# Patient Record
Sex: Male | Born: 1995 | Race: Black or African American | Hispanic: No | Marital: Single | Smoking: Never smoker
Health system: Southern US, Community
[De-identification: ages and names within clinical notes are randomized; demographics above are authoritative.]

## PROBLEM LIST (undated history)

## (undated) DIAGNOSIS — J9801 Acute bronchospasm: Secondary | ICD-10-CM

## (undated) DIAGNOSIS — J45909 Unspecified asthma, uncomplicated: Secondary | ICD-10-CM

## (undated) HISTORY — PX: NO PAST SURGERIES: SHX2092

---
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
## 2015-09-17 ENCOUNTER — Encounter (HOSPITAL_COMMUNITY): Payer: Self-pay | Admitting: *Deleted

## 2015-09-17 ENCOUNTER — Emergency Department (HOSPITAL_COMMUNITY): Payer: Medicaid Other

## 2015-09-17 ENCOUNTER — Emergency Department (HOSPITAL_COMMUNITY)
Admission: EM | Admit: 2015-09-17 | Discharge: 2015-09-17 | Disposition: A | Discharge status: Home / Self Care | Payer: Medicaid Other | Attending: Emergency Medicine | Admitting: Emergency Medicine

## 2015-09-17 DIAGNOSIS — J3489 Other specified disorders of nose and nasal sinuses: Secondary | ICD-10-CM | POA: Insufficient documentation

## 2015-09-17 DIAGNOSIS — J029 Acute pharyngitis, unspecified: Secondary | ICD-10-CM | POA: Insufficient documentation

## 2015-09-17 DIAGNOSIS — J45909 Unspecified asthma, uncomplicated: Secondary | ICD-10-CM | POA: Diagnosis present

## 2015-09-17 DIAGNOSIS — J069 Acute upper respiratory infection, unspecified: Secondary | ICD-10-CM | POA: Diagnosis not present

## 2015-09-17 DIAGNOSIS — J4521 Mild intermittent asthma with (acute) exacerbation: Secondary | ICD-10-CM | POA: Insufficient documentation

## 2015-09-17 DIAGNOSIS — Z202 Contact with and (suspected) exposure to infections with a predominantly sexual mode of transmission: Secondary | ICD-10-CM | POA: Insufficient documentation

## 2015-09-17 MED ORDER — ALBUTEROL SULFATE (2.5 MG/3ML) 0.083% IN NEBU
5.0000 mg | INHALATION_SOLUTION | Freq: Once | RESPIRATORY_TRACT | Status: AC
  Administered 2015-09-17: 5 mg via RESPIRATORY_TRACT

## 2015-09-17 MED ORDER — ALBUTEROL SULFATE (2.5 MG/3ML) 0.083% IN NEBU
INHALATION_SOLUTION | RESPIRATORY_TRACT | Status: AC
  Filled 2015-09-17: qty 6

## 2015-09-17 MED ORDER — AZITHROMYCIN 1 G PO PACK
1.0000 g | PACK | Freq: Once | ORAL | Status: AC
  Administered 2015-09-17: 1 g via ORAL
  Filled 2015-09-17: qty 1

## 2015-09-17 MED ORDER — LIDOCAINE HCL (PF) 1 % IJ SOLN
0.9000 mL | Freq: Once | INTRAMUSCULAR | Status: AC
  Administered 2015-09-17: 0.9 mL

## 2015-09-17 MED ORDER — LIDOCAINE HCL (PF) 1 % IJ SOLN
INTRAMUSCULAR | Status: AC
  Filled 2015-09-17: qty 5

## 2015-09-17 MED ORDER — CEFTRIAXONE SODIUM 250 MG IJ SOLR
250.0000 mg | Freq: Once | INTRAMUSCULAR | Status: AC
  Administered 2015-09-17: 250 mg via INTRAMUSCULAR
  Filled 2015-09-17: qty 250

## 2015-09-17 NOTE — ED Notes (Signed)
As patient was walking out of room to be discharged he requested a STD check.

## 2015-09-17 NOTE — ED Notes (Signed)
Pt states that 2 days ago he started experiencing asthma exacerbation. States that he also has congestion and chest tightness. States that he has tried his inhaler twice with no relief. No distress or shortness of breath noted in triage.

## 2015-09-17 NOTE — Discharge Instructions (Signed)
Asthma, Adult °Asthma is a condition of the lungs in which the airways tighten and narrow. Asthma can make it hard to breathe. Asthma cannot be cured, but medicine and lifestyle changes can help control it. Asthma may be started (triggered) by: °· Animal skin flakes (dander). °· Dust. °· Cockroaches. °· Pollen. °· Mold. °· Smoke. °· Cleaning products. °· Hair sprays or aerosol sprays. °· Paint fumes or strong smells. °· Cold air, weather changes, and winds. °· Crying or laughing hard. °· Stress. °· Certain medicines or drugs. °· Foods, such as dried fruit, potato chips, and sparkling grape juice. °· Infections or conditions (colds, flu). °· Exercise. °· Certain medical conditions or diseases. °· Exercise or tiring activities. °HOME CARE  °· Take medicine as told by your doctor. °· Use a peak flow meter as told by your doctor. A peak flow meter is a tool that measures how well the lungs are working. °· Record and keep track of the peak flow meter's readings. °· Understand and use the asthma action plan. An asthma action plan is a written plan for taking care of your asthma and treating your attacks. °· To help prevent asthma attacks: °· Do not smoke. Stay away from secondhand smoke. °· Change your heating and air conditioning filter often. °· Limit your use of fireplaces and wood stoves. °· Get rid of pests (such as roaches and mice) and their droppings. °· Throw away plants if you see mold on them. °· Clean your floors. Dust regularly. Use cleaning products that do not smell. °· Have someone vacuum when you are not home. Use a vacuum cleaner with a HEPA filter if possible. °· Replace carpet with wood, tile, or vinyl flooring. Carpet can trap animal skin flakes and dust. °· Use allergy-proof pillows, mattress covers, and box spring covers. °· Wash bed sheets and blankets every week in hot water and dry them in a dryer. °· Use blankets that are made of polyester or cotton. °· Clean bathrooms and kitchens with bleach.  If possible, have someone repaint the walls in these rooms with mold-resistant paint. Keep out of the rooms that are being cleaned and painted. °· Wash hands often. °GET HELP IF: °· You have make a whistling sound when breaking (wheeze), have shortness of breath, or have a cough even if taking medicine to prevent attacks. °· The colored mucus you cough up (sputum) is thicker than usual. °· The colored mucus you cough up changes from clear or white to yellow, green, gray, or bloody. °· You have problems from the medicine you are taking such as: °· A rash. °· Itching. °· Swelling. °· Trouble breathing. °· You need reliever medicines more than 2-3 times a week. °· Your peak flow measurement is still at 50-79% of your personal best after following the action plan for 1 hour. °· You have a fever. °GET HELP RIGHT AWAY IF:  °· You seem to be worse and are not responding to medicine during an asthma attack. °· You are short of breath even at rest. °· You get short of breath when doing very little activity. °· You have trouble eating, drinking, or talking. °· You have chest pain. °· You have a fast heartbeat. °· Your lips or fingernails start to turn blue. °· You are light-headed, dizzy, or faint. °· Your peak flow is less than 50% of your personal best. °  °This information is not intended to replace advice given to you by your health care provider. Make sure   you discuss any questions you have with your health care provider.   Document Released: 04/20/2008 Document Revised: 07/24/2015 Document Reviewed: 06/01/2013 Elsevier Interactive Patient Education 2016 Elsevier Inc. Upper Respiratory Infection, Adult Most upper respiratory infections (URIs) are a viral infection of the air passages leading to the lungs. A URI affects the nose, throat, and upper air passages. The most common type of URI is nasopharyngitis and is typically referred to as "the common cold." URIs run their course and usually go away on their own.  Most of the time, a URI does not require medical attention, but sometimes a bacterial infection in the upper airways can follow a viral infection. This is called a secondary infection. Sinus and middle ear infections are common types of secondary upper respiratory infections. Bacterial pneumonia can also complicate a URI. A URI can worsen asthma and chronic obstructive pulmonary disease (COPD). Sometimes, these complications can require emergency medical care and may be life threatening.  CAUSES Almost all URIs are caused by viruses. A virus is a type of germ and can spread from one person to another.  RISKS FACTORS You may be at risk for a URI if:   You smoke.   You have chronic heart or lung disease.  You have a weakened defense (immune) system.   You are very young or very old.   You have nasal allergies or asthma.  You work in crowded or poorly ventilated areas.  You work in health care facilities or schools. SIGNS AND SYMPTOMS  Symptoms typically develop 2-3 days after you come in contact with a cold virus. Most viral URIs last 7-10 days. However, viral URIs from the influenza virus (flu virus) can last 14-18 days and are typically more severe. Symptoms may include:   Runny or stuffy (congested) nose.   Sneezing.   Cough.   Sore throat.   Headache.   Fatigue.   Fever.   Loss of appetite.   Pain in your forehead, behind your eyes, and over your cheekbones (sinus pain).  Muscle aches.  DIAGNOSIS  Your health care provider may diagnose a URI by:  Physical exam.  Tests to check that your symptoms are not due to another condition such as:  Strep throat.  Sinusitis.  Pneumonia.  Asthma. TREATMENT  A URI goes away on its own with time. It cannot be cured with medicines, but medicines may be prescribed or recommended to relieve symptoms. Medicines may help:  Reduce your fever.  Reduce your cough.  Relieve nasal congestion. HOME CARE  INSTRUCTIONS   Take medicines only as directed by your health care provider.   Gargle warm saltwater or take cough drops to comfort your throat as directed by your health care provider.  Use a warm mist humidifier or inhale steam from a shower to increase air moisture. This may make it easier to breathe.  Drink enough fluid to keep your urine clear or pale yellow.   Eat soups and other clear broths and maintain good nutrition.   Rest as needed.   Return to work when your temperature has returned to normal or as your health care provider advises. You may need to stay home longer to avoid infecting others. You can also use a face mask and careful hand washing to prevent spread of the virus.  Increase the usage of your inhaler if you have asthma.   Do not use any tobacco products, including cigarettes, chewing tobacco, or electronic cigarettes. If you need help quitting, ask your health  care provider. PREVENTION  The best way to protect yourself from getting a cold is to practice good hygiene.   Avoid oral or hand contact with people with cold symptoms.   Wash your hands often if contact occurs.  There is no clear evidence that vitamin C, vitamin E, echinacea, or exercise reduces the chance of developing a cold. However, it is always recommended to get plenty of rest, exercise, and practice good nutrition.  SEEK MEDICAL CARE IF:   You are getting worse rather than better.   Your symptoms are not controlled by medicine.   You have chills.  You have worsening shortness of breath.  You have brown or red mucus.  You have yellow or brown nasal discharge.  You have pain in your face, especially when you bend forward.  You have a fever.  You have swollen neck glands.  You have pain while swallowing.  You have white areas in the back of your throat. SEEK IMMEDIATE MEDICAL CARE IF:   You have severe or persistent:  Headache.  Ear pain.  Sinus pain.  Chest  pain.  You have chronic lung disease and any of the following:  Wheezing.  Prolonged cough.  Coughing up blood.  A change in your usual mucus.  You have a stiff neck.  You have changes in your:  Vision.  Hearing.  Thinking.  Mood. MAKE SURE YOU:   Understand these instructions.  Will watch your condition.  Will get help right away if you are not doing well or get worse.   This information is not intended to replace advice given to you by your health care provider. Make sure you discuss any questions you have with your health care provider.   Document Released: 04/28/2001 Document Revised: 03/19/2015 Document Reviewed: 02/07/2014 Elsevier Interactive Patient Education 2016 [employer].   Emergency Department Resource Guide 1) Find a Doctor and Pay Out of Pocket Although you won't have to find out who is covered by your insurance plan, it is a good idea to ask around and get recommendations. You will then need to call the office and see if the doctor you have chosen will accept you as a new patient and what types of options they offer for patients who are self-pay. Some doctors offer discounts or will set up payment plans for their patients who do not have insurance, but you will need to ask so you aren't surprised when you get to your appointment.  2) Contact Your Local Health Department Not all health departments have doctors that can see patients for sick visits, but many do, so it is worth a call to see if yours does. If you don't know where your local health department is, you can check in your phone book. The CDC also has a tool to help you locate your state's health department, and many state websites also have listings of all of their local health departments.  3) Find a Walk-in Clinic If your illness is not likely to be very severe or complicated, you may want to try a walk in clinic. These are popping up all over the country in pharmacies, drugstores, and  shopping centers. They're usually staffed by nurse practitioners or physician assistants that have been trained to treat common illnesses and complaints. They're usually fairly quick and inexpensive. However, if you have serious medical issues or chronic medical problems, these are probably not your best option.  No Primary Care Doctor: - Call Health Connect at  [phone] -  they can help you locate a primary care doctor that  accepts your insurance, provides certain services, etc. - Physician Referral Service- [phone]  Chronic Pain Problems: Organization         Address  Phone   Notes  [facility]  [phone] Patients need to be referred by their primary care doctor.   Medication Assistance: Organization         Address  Phone   Notes  [facility] Medication [facility] [address] [place] [place], [place] [address] [phone] --Must be a resident of [facility] -- Must have NO insurance coverage whatsoever (no Medicaid/ Medicare, etc.) -- The pt. MUST have a primary care doctor that directs their care regularly and follows them in the community   [facility]  [phone]   [employer]  [phone]    Agencies that provide inexpensive medical care: Organization         Address  Phone   Notes  [facility]  [phone]   [facility]    [phone]   [facility] [address] [place], [place] [address] [phone]   [facility] [address] [place] [place] [phone]   Planned Parenthood    [phone]   [facility]    [phone]   [facility]  [address], [place] Phone:  [phone], Fax:  [phone] Hours of Operation:  9 am - 6 pm, M-F.  Also accepts Medicaid/Medicare and self-pay.  [facility]  [address], [place] Phone: [phone],  Fax: [phone]. Hours of Operation:  8:30 am - 5:30 pm, M-F.  Also accepts Medicaid and self-pay.  [facility] [place] [address] [place] Phone: [phone]   [facility] [address] Natasha Bence [place], [place] [phone], Ext. [phone] Mondays & Thursdays: 7-9 AM.  First 15 patients are seen on a first come, first serve basis.    Medicaid-accepting [facility] Providers:  Organization         Address  Phone   Notes  [facility] [address], [place] [phone] Also accepts self-pay patients.  [facility] [address] Laurell Josephs [place], [place]  [phone]   [facility] [address], [place] [phone]   [facility] [address], [place] [phone]   Renaye Rakers [address], [place]   [phone] Only accepts [place] patients after they have their name applied to their card.   Self-Pay (no insurance) in [facility]:  Organization         Address  Phone   Notes  Sickle Cell Patients, [facility] [address] [place], [place] [phone]   [facility] [address] [place], [place] [phone]   [facility] [place]  [address], [place] [phone]   [facility]/Dr. Osei-Bonsu  [address], [place] or [address], [place] [phone] Phone number for both [place] and [place] locations is the same.  [facility]gent [facility] and [facility] [address] [place] [phone]   [facility] [address], [place] or [address] [phone]   [facility] [address], [place] [phone], phone; [phone], fax Sees patients 1st and 3rd Saturday of every month.  Must not qualify for public or private  insurance (i.e. Medicaid, Medicare, [place] Health Choice, Veterans' Benefits)  Household income should be no more than 200% of the poverty level The clinic cannot treat you if you are pregnant or think you are pregnant  Sexually transmitted diseases are not treated at the clinic.    Dental Care: Organization         Address  Phone  Notes  [facility] [address], [place] [phone] Accepts children up to age 19 who are enrolled in [place] or [place] Health Choice; pregnant women with a Medicaid card; and children who have applied for Medicaid or [place] Health Choice, but were declined, whose parents can pay a reduced fee at time of service.  [facility] [place]  [address], [place] [phone] Accepts children up to age 19 who are enrolled in [place] or [place] Health Choice; pregnant women with a Medicaid card; and children who have applied for Medicaid or [place] Health Choice, but were declined, whose parents can pay a reduced fee at time of service.  [facility]  [address], [place] [phone] Patients are seen by appointment only. Walk-ins are not accepted. [facility] will see patients 19 years of age and older. Monday - Tuesday (8am-5pm) Most Wednesdays (8:30-5pm) $30 per visit, cash only  [facility]  [address], [facility] [place] [phone] Patients are seen by appointment only. Walk-ins are not accepted. [facility] will see patients 19 years of age and older. One Wednesday Evening (Monthly: Volunteer Based).  $30 per visit, cash only  [facility]  [phone] for adults; Children under age 474, call Graduate Pediatric Dentistry at [phone]. Children aged 704-14, please call [phone] to request a pediatric application.  Dental services are provided in all areas of dental care  including fillings, crowns and bridges, complete and partial dentures, implants, gum treatment, root canals, and extractions. Preventive care is also provided. Treatment is provided to both adults and children. Patients are selected via a lottery and there is often a waiting list.   [facility] [address], [place]  [phone] [email or web address]   [facility] [address], [place], [place] [phone], Ext. [phone] Second and Fourth Thursday of each month, opens at 6:30 AM; Clinic ends at 9 AM.  Patients are seen on a first-come first-served basis, and a limited number are seen during each clinic.   [facility]  [address], [place], [place] [phone]   Eligibility Requirements You must have lived in [place], [place], or [place] counties for at least the last three months.   You cannot be eligible for state or federal sponsored [place]healthcare insurance, including CIGNAVeterans Administration, [place]icaid, or [employer]icare.   You generally cannot be eligible for healthcare insurance through your employer.    How to apply: Eligibility screenings are held every Tuesday and Wednesday afternoon from 1:00 pm until 4:00 pm. You do not need an appointment for the interview!  [facility] [address], [place], [place] [phone]   [facility]  [phone]   [facility]  [phone]   [facility] Health Department  [phone]    Behavioral Health Resources in the Community: Intensive Outpatient Programs Organization         Address  Phone  Notes  [facility] [address], [place], [place] [phone]   [facility] Outpatient [address], [place], [place] [phone]   [facility] [address] [place], [place]  [phone]   [facility] Mental Health [address]  [place], [place] [phone] or [phone]    Substance Abuse Resources Organization         Address  Phone  Notes  Al[facility] Drug Services  [phone]   [facility]  [phone]   [facility] [place]  [phone]   Floydene Flock  [phone]   Residential & Outpatient Substance Abuse Program  [phone]   Psychological Services Organization         Address  Phone  Notes  [facility]  [phone]   [facility]  [phone]   [facility] Mental Health [address], [place] [phone] or [phone]    Mobile Crisis Teams Organization         Address  Phone  Notes  [facility], Mobile Crisis Care Unit  [phone]   [facility]  [address] [place], [place] [phone]   Doristine Locks [address] [place] [phone]    Self-Help/Support Groups Organization         Address  Phone             Notes  [facility] [place] - variety of support groups  [phone]- I7437963 Call for more information  Narcotics Anonymous (NA), [facility] [address], [employer] [place]  2 meetings at this location   Statistician         Address  Phone  Notes  [facility] [address],    [place]  [phone]   [facility] [place]  [address] [place] [address], [place], [place] [phone]   [facility] [address] [place], [place] [phone] Admissions: 8am-3pm M-F  [facility] [address]    [place], [place] [phone]   [facility] [address] [place], [place], [place] [phone]   [facility] [address]  [place], [place] [phone]   [facility] - Intensive Outpatient [address] Laure[address] Josephs [address], [place], [place] [phone]   [facility] ([facility] [address] [place]  [place], [place] [phone] or [phone]   [facility]  [facility] ([facility]) [address], [place], [place] [phone] Accepts Medicaid  [facility] [address]  [place] [phone] Substance Abuse/Addiction Treatment   [facility] Organization         Address  Phone  Notes  [facility]  [phone]   Angie Fava, PhD [address] Ervin Knack [place], [place]   [phone] or [phone]   [facility] Behavioral   [address] [place], [place] [phone]   [facility] [address], [place], [place] [phone] Insurance/Medicaid/sponsorship through [employer] and Families [address]  [place], [place] [phone] Therapy/tele-psych/case  [facility] [address]   [place], [place] [phone]    Dr. Lolly Mustache  [phone]   [facility]  [employer] [facility]. 1) [address], [place] 2) [address], [place] 3)  [address], [place] [phone]  [phone]   [facility] Child Abuse Hotline 9377274794 or [phone] (After Hours)     Sexually Transmitted Disease A sexually transmitted disease (STD) is a disease or infection that may be passed (transmitted) from person to person, usually during sexual activity. This may happen by way of saliva, semen, blood, vaginal mucus, or urine. Common STDs include:  Gonorrhea.  Chlamydia.  Syphilis.  HIV and AIDS.  Genital herpes.  Hepatitis B and C.  Trichomonas.  Human papillomavirus (HPV).  Pubic lice.  Scabies.  Mites.  Bacterial vaginosis. WHAT ARE CAUSES OF STDs? An STD may be caused by bacteria, a virus, or parasites. STDs are often transmitted during sexual activity if one person is infected. However, they may also be transmitted through nonsexual means. STDs may be transmitted after:   Sexual intercourse with an infected person.  Sharing sex toys with  an infected person.  Sharing needles with an infected person or using unclean piercing or tattoo needles.  Having intimate contact with the genitals, mouth, or rectal areas of an infected person.  Exposure to infected fluids during birth. WHAT ARE THE SIGNS AND SYMPTOMS OF STDs? Different STDs have different symptoms. Some people may not have any symptoms. If symptoms are present, they may include:  Painful or bloody urination.  Pain in the pelvis, abdomen, vagina, anus, throat, or eyes.  A skin rash, itching, or irritation.  Growths, ulcerations, blisters, or sores in the genital and anal areas.  Abnormal vaginal discharge with or without bad odor.  Penile discharge in men.  Fever.  Pain or bleeding during sexual intercourse.  Swollen glands in the groin area.  Yellow skin and eyes (jaundice). This is seen with hepatitis.  Swollen testicles.  Infertility.  Sores and blisters in the mouth. HOW ARE STDs DIAGNOSED? To make a diagnosis, your health care provider may:  Take a medical history.  Perform a physical exam.  Take a sample of any discharge to examine.  Swab the throat, cervix, opening to the penis, rectum, or vagina for testing.  Test a sample of your first morning urine.  Perform blood tests.  Perform a Pap test, if this applies.  Perform a colposcopy.  Perform a laparoscopy. HOW ARE STDs TREATED? Treatment depends on the STD. Some STDs may be treated but not cured.  Chlamydia, gonorrhea, trichomonas, and syphilis can be cured with antibiotic medicine.  Genital herpes, hepatitis, and HIV can be treated, but not cured, with prescribed medicines. The medicines lessen symptoms.  Genital warts from HPV can be treated with medicine or by freezing, burning (electrocautery), or surgery. Warts may come back.  HPV cannot be cured with medicine or surgery. However, abnormal areas may be removed from the cervix, vagina, or vulva.  If your diagnosis is  confirmed, your recent sexual partners need treatment. This is true even if they are symptom-free or have a negative culture or evaluation. They should not have sex until their health care providers say it is okay.  Your health care provider may test you for infection again 3 months after treatment. HOW CAN I REDUCE MY RISK OF GETTING AN  STD? Take these steps to reduce your risk of getting an STD:  Use latex condoms, dental dams, and water-soluble lubricants during sexual activity. Do not use petroleum jelly or oils.  Avoid having multiple sex partners.  Do not have sex with someone who has other sex partners  Do not have sex with anyone you do not know or who is at high risk for an STD.  Avoid risky sex practices that can break your skin.  Do not have sex if you have open sores on your mouth or skin.  Avoid drinking too much alcohol or taking illegal drugs. Alcohol and drugs can affect your judgment and put you in a vulnerable position.  Avoid engaging in oral and anal sex acts.  Get vaccinated for HPV and hepatitis. If you have not received these vaccines in the past, talk to your health care provider about whether one or both might be right for you.  If you are at risk of being infected with HIV, it is recommended that you take a prescription medicine daily to prevent HIV infection. This is called pre-exposure prophylaxis (PrEP). You are considered at risk if:  You are a man who has sex with other men (MSM).  You are a heterosexual man or woman and are sexually active with more than one partner.  You take drugs by injection.  You are sexually active with a partner who has HIV.  Talk with your health care provider about whether you are at high risk of being infected with HIV. If you choose to begin PrEP, you should first be tested for HIV. You should then be tested every 3 months for as long as you are taking PrEP. WHAT SHOULD I DO IF I THINK I HAVE AN STD?  See your health  care provider.  Tell your sexual partner(s). They should be tested and treated for any STDs.  Do not have sex until your health care provider says it is okay. WHEN SHOULD I GET IMMEDIATE MEDICAL CARE? Contact your health care provider right away if:   You have severe abdominal pain.  You are a man and notice swelling or pain in your testicles.  You are a woman and notice swelling or pain in your vagina.   This information is not intended to replace advice given to you by your health care provider. Make sure you discuss any questions you have with your health care provider.   Document Released: 01/23/2003 Document Revised: 11/23/2014 Document Reviewed: 05/23/2013 Elsevier Interactive Patient Education [employer]

## 2015-09-17 NOTE — ED Provider Notes (Signed)
CSN: [id number]     Arrival date & time 09/17/15  1459 History  By signing my name below, I, John Schwartz, attest that this documentation has been prepared under the direction and in the presence of John Morn, NP. Electronically Signed: Elon Schwartz ED Scribe. 09/17/2015. 4:44 PM.    Chief Complaint  Patient presents with  . Asthma   Patient is a 19 y.o. male presenting with asthma. The history is provided by the patient. No language interpreter was used.  Asthma This is a recurrent problem. The current episode started 2 days ago. The problem occurs constantly. The problem has not changed since onset.Associated symptoms comments: Chest tightness, rhinorrhea, sore throat.. Nothing aggravates the symptoms. Nothing relieves the symptoms. Treatments tried: Advair. The treatment provided no relief.   HPI Comments: John Schwartz is a 19 y.o. male with hx of asthma (well-controlled) who presents to the Emergency Department complaining of wheezing and chest tightness onset 2 days ago; unrelieved by advair (two doses since onset).  He describes his respiratory complaints as typical of his asthma exacerbation.  He reports his complaint was preceded by onset of rhinorrhea and sore throat.  He denies n/v.    Past Medical History  Diagnosis Date  . Asthma    History reviewed. No pertinent past surgical history. No family history on file. Social History  Substance Use Topics  . Smoking status: Never Smoker   . Smokeless tobacco: None  . Alcohol Use: No    Review of Systems  Respiratory: Positive for chest tightness and wheezing.   All other systems reviewed and are negative.   Allergies  Review of patient's allergies indicates no known allergies.  Home Medications   Prior to Admission medications   Not on File   BP 136/76 mmHg  Pulse 75  Temp(Src) 98.6 F (37 C) (Oral)  Resp 16  Ht  (1.778 m)  Wt 155 lb (70.308 kg)  BMI 22.24 kg/m2  SpO2 98% Physical Exam   Constitutional: He is oriented to person, place, and time. He appears well-developed and well-nourished. No distress.  HENT:  Head: Normocephalic and atraumatic.  Oropharynx erythematous with cobblestoning.  No exudate noted.    Eyes: Conjunctivae and EOM are normal.  Neck: Neck supple. No tracheal deviation present.  Cardiovascular: Normal rate.   Pulmonary/Chest: Effort normal. No respiratory distress.  Chest clear.    Musculoskeletal: Normal range of motion.  Lymphadenopathy:    He has no cervical adenopathy.  Neurological: He is alert and oriented to person, place, and time.  Skin: Skin is warm and dry.  Psychiatric: He has a normal mood and affect. His behavior is normal.  Nursing note and vitals reviewed.   ED Course  Procedures (including critical care time)  DIAGNOSTIC STUDIES: Oxygen Saturation is 98% on RA, normal by my interpretation.    COORDINATION OF CARE:  4:41 PM Will order CXR.  Patient acknowledges and agrees with plan.    Labs Review Labs Reviewed - No data to display  Imaging Review Dg Chest 2 View  09/17/2015  CLINICAL DATA:  Chest pain and congestion for 2 days. History of asthma. EXAM: CHEST  2 VIEW COMPARISON:  None. FINDINGS: The cardiac silhouette, mediastinal and hilar contours are normal. The lungs demonstrate mild hyperinflation. No infiltrates, edema or effusions. The bony thorax is intact. IMPRESSION: Mild hyperinflation but no acute pulmonary findings. Electronically Signed   By: Rudie Meyer M.D.   On: 09/17/2015 17:35   I have personally  reviewed and evaluated these images and lab results as part of my medical decision-making.   EKG Interpretation None     Upon discharge, patient reported to nurse that his girlfriend was seen and treated for gonorrhea.  Patient admits to unprotected sexual activity.  No current symptoms.  Will treat prophylactically with rocephin and zithromax. MDM   Final diagnoses:  None    Viral URI. Mild asthma  exacerbation. STD exposure. Return precautions discussed.  I personally performed the services described in this documentation, which was scribed in my presence. The recorded information has been reviewed and is accurate.   John Mornavid Keila Turan, NP 09/17/15 2254  John Raceravid Yelverton, MD 09/21/15 [phone]

## 2015-11-26 ENCOUNTER — Encounter (HOSPITAL_COMMUNITY): Payer: Self-pay | Admitting: Emergency Medicine

## 2016-01-14 ENCOUNTER — Encounter: Payer: Self-pay | Admitting: Emergency Medicine

## 2016-01-14 ENCOUNTER — Emergency Department
Admission: EM | Admit: 2016-01-14 | Discharge: 2016-01-14 | Disposition: A | Discharge status: Home / Self Care | Payer: No Typology Code available for payment source | Attending: Emergency Medicine | Admitting: Emergency Medicine

## 2016-01-14 DIAGNOSIS — Z79899 Other long term (current) drug therapy: Secondary | ICD-10-CM | POA: Diagnosis not present

## 2016-01-14 DIAGNOSIS — Y92410 Unspecified street and highway as the place of occurrence of the external cause: Secondary | ICD-10-CM | POA: Diagnosis not present

## 2016-01-14 DIAGNOSIS — Z792 Long term (current) use of antibiotics: Secondary | ICD-10-CM | POA: Insufficient documentation

## 2016-01-14 DIAGNOSIS — S8001XA Contusion of right knee, initial encounter: Secondary | ICD-10-CM | POA: Diagnosis not present

## 2016-01-14 DIAGNOSIS — S8991XA Unspecified injury of right lower leg, initial encounter: Secondary | ICD-10-CM | POA: Diagnosis present

## 2016-01-14 DIAGNOSIS — Y9389 Activity, other specified: Secondary | ICD-10-CM | POA: Diagnosis not present

## 2016-01-14 DIAGNOSIS — Z791 Long term (current) use of non-steroidal anti-inflammatories (NSAID): Secondary | ICD-10-CM | POA: Diagnosis not present

## 2016-01-14 DIAGNOSIS — Z7951 Long term (current) use of inhaled steroids: Secondary | ICD-10-CM | POA: Diagnosis not present

## 2016-01-14 DIAGNOSIS — Z7952 Long term (current) use of systemic steroids: Secondary | ICD-10-CM | POA: Diagnosis not present

## 2016-01-14 DIAGNOSIS — Y998 Other external cause status: Secondary | ICD-10-CM | POA: Diagnosis not present
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
## 2016-01-14 NOTE — Discharge Instructions (Signed)
Contusion °A contusion is a deep bruise. Contusions are the result of a blunt injury to tissues and muscle fibers under the skin. The injury causes bleeding under the skin. The skin overlying the contusion may turn blue, purple, or yellow. Minor injuries will give you a painless contusion, but more severe contusions may stay painful and swollen for a few weeks.  °CAUSES  °This condition is usually caused by a blow, trauma, or direct force to an area of the body. °SYMPTOMS  °Symptoms of this condition include: °· Swelling of the injured area. °· Pain and tenderness in the injured area. °· Discoloration. The area may have redness and then turn blue, purple, or yellow. °DIAGNOSIS  °This condition is diagnosed based on a physical exam and medical history. An X-ray, CT scan, or MRI may be needed to determine if there are any associated injuries, such as broken bones (fractures). °TREATMENT  °Specific treatment for this condition depends on what area of the body was injured. In general, the best treatment for a contusion is resting, icing, applying pressure to (compression), and elevating the injured area. This is often called the RICE strategy. Over-the-counter anti-inflammatory medicines may also be recommended for pain control.  °HOME CARE INSTRUCTIONS  °· Rest the injured area. °· If directed, apply ice to the injured area: °· Put ice in a plastic bag. °· Place a towel between your skin and the bag. °· Leave the ice on for 20 minutes, 2-3 times per day. °· If directed, apply light compression to the injured area using an elastic bandage. Make sure the bandage is not wrapped too tightly. Remove and reapply the bandage as directed by your health care provider. °· If possible, raise (elevate) the injured area above the level of your heart while you are sitting or lying down. °· Take over-the-counter and prescription medicines only as told by your health care provider. °SEEK MEDICAL CARE IF: °· Your symptoms do not  improve after several days of treatment. °· Your symptoms get worse. °· You have difficulty moving the injured area. °SEEK IMMEDIATE MEDICAL CARE IF:  °· You have severe pain. °· You have numbness in a hand or foot. °· Your hand or foot turns pale or cold. °  °This information is not intended to replace advice given to you by your health care provider. Make sure you discuss any questions you have with your health care provider. °  °Document Released: 08/12/2005 Document Revised: 07/24/2015 Document Reviewed: 03/20/2015 °Elsevier Interactive Patient Education ©2016 [employer] ° °Motor Vehicle Collision °It is common to have multiple bruises and sore muscles after a motor vehicle collision (MVC). These tend to feel worse for the first 24 hours. You may have the most stiffness and soreness over the first several hours. You may also feel worse when you wake up the first morning after your collision. After this point, you will usually begin to improve with each day. The speed of improvement often depends on the severity of the collision, the number of injuries, and the location and nature of these injuries. °HOME CARE INSTRUCTIONS °· Put ice on the injured area. °¨ Put ice in a plastic bag. °¨ Place a towel between your skin and the bag. °¨ Leave the ice on for 15-20 minutes, 3-4 times a day, or as directed by your health care provider. °· Drink enough fluids to keep your urine clear or pale yellow. Do not drink alcohol. °· Take a warm shower or bath once or twice a   day. This will increase blood flow to sore muscles. °· You may return to activities as directed by your caregiver. Be careful when lifting, as this may aggravate neck or back pain. °· Only take over-the-counter or prescription medicines for pain, discomfort, or fever as directed by your caregiver. Do not use aspirin. This may increase bruising and bleeding. °SEEK IMMEDIATE MEDICAL CARE IF: °· You have numbness, tingling, or weakness in the arms or  legs. °· You develop severe headaches not relieved with medicine. °· You have severe neck pain, especially tenderness in the middle of the back of your neck. °· You have changes in bowel or bladder control. °· There is increasing pain in any area of the body. °· You have shortness of breath, light-headedness, dizziness, or fainting. °· You have chest pain. °· You feel sick to your stomach (nauseous), throw up (vomit), or sweat. °· You have increasing abdominal discomfort. °· There is blood in your urine, stool, or vomit. °· You have pain in your shoulder (shoulder strap areas). °· You feel your symptoms are getting worse. °MAKE SURE YOU: °· Understand these instructions. °· Will watch your condition. °· Will get help right away if you are not doing well or get worse. °  °This information is not intended to replace advice given to you by your health care provider. Make sure you discuss any questions you have with your health care provider. °  °Document Released: 11/02/2005 Document Revised: 11/23/2014 Document Reviewed: 04/01/2011 °Elsevier Interactive Patient Education ©2016 [employer] ° °
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
## 2016-03-16 ENCOUNTER — Observation Stay
Admission: EM | Admit: 2016-03-16 | Discharge: 2016-03-17 | Disposition: A | Discharge status: Home / Self Care | Payer: Medicaid Other | Attending: Internal Medicine | Admitting: Internal Medicine

## 2016-03-16 ENCOUNTER — Emergency Department: Payer: Medicaid Other

## 2016-03-16 DIAGNOSIS — Z8261 Family history of arthritis: Secondary | ICD-10-CM | POA: Insufficient documentation

## 2016-03-16 DIAGNOSIS — J4541 Moderate persistent asthma with (acute) exacerbation: Secondary | ICD-10-CM | POA: Diagnosis present

## 2016-03-16 DIAGNOSIS — Z7951 Long term (current) use of inhaled steroids: Secondary | ICD-10-CM | POA: Diagnosis not present

## 2016-03-16 DIAGNOSIS — Z79899 Other long term (current) drug therapy: Secondary | ICD-10-CM | POA: Insufficient documentation

## 2016-03-16 DIAGNOSIS — J45901 Unspecified asthma with (acute) exacerbation: Principal | ICD-10-CM | POA: Insufficient documentation

## 2016-03-16 DIAGNOSIS — J4551 Severe persistent asthma with (acute) exacerbation: Secondary | ICD-10-CM | POA: Diagnosis present

## 2016-03-16 DIAGNOSIS — R0600 Dyspnea, unspecified: Secondary | ICD-10-CM | POA: Diagnosis present

## 2016-03-16 LAB — CBC WITH DIFFERENTIAL/PLATELET
BASOS ABS: 0.1 10*3/uL (ref 0–0.1)
EOS ABS: 0.8 10*3/uL — AB (ref 0–0.7)
HEMATOCRIT: 41.7 % (ref 40.0–52.0)
Hemoglobin: 14.1 g/dL (ref 13.0–18.0)
Lymphs Abs: 2.3 10*3/uL (ref 1.0–3.6)
MCH: 27.2 pg (ref 26.0–34.0)
MCHC: 33.8 g/dL (ref 32.0–36.0)
MCV: 80.4 fL (ref 80.0–100.0)
Monocytes Absolute: 0.6 10*3/uL (ref 0.2–1.0)
Monocytes Relative: 6 %
Neutro Abs: 5.4 10*3/uL (ref 1.4–6.5)
Neutrophils Relative %: 58 %
PLATELETS: 254 10*3/uL (ref 150–440)
RBC: 5.19 MIL/uL (ref 4.40–5.90)
RDW: 13.3 % (ref 11.5–14.5)
WBC: 9.2 10*3/uL (ref 3.8–10.6)

## 2016-03-16 LAB — CBC
HCT: 42.8 % (ref 40.0–52.0)
Hemoglobin: 14.5 g/dL (ref 13.0–18.0)
MCH: 27.3 pg (ref 26.0–34.0)
MCHC: 33.8 g/dL (ref 32.0–36.0)
MCV: 80.7 fL (ref 80.0–100.0)
PLATELETS: 243 10*3/uL (ref 150–440)
RBC: 5.3 MIL/uL (ref 4.40–5.90)
RDW: 13.4 % (ref 11.5–14.5)
WBC: 10.3 10*3/uL (ref 3.8–10.6)

## 2016-03-16 LAB — BASIC METABOLIC PANEL
Anion gap: 6 (ref 5–15)
Anion gap: 8 (ref 5–15)
BUN: 12 mg/dL (ref 6–20)
BUN: 12 mg/dL (ref 6–20)
CALCIUM: 9.5 mg/dL (ref 8.9–10.3)
CALCIUM: 9.4 mg/dL (ref 8.9–10.3)
CO2: 26 mmol/L (ref 22–32)
CO2: 24 mmol/L (ref 22–32)
CREATININE: 1.04 mg/dL (ref 0.61–1.24)
CREATININE: 1.08 mg/dL (ref 0.61–1.24)
Chloride: 106 mmol/L (ref 101–111)
Chloride: 106 mmol/L (ref 101–111)
GFR calc Af Amer: >60 mL/min (ref >60)
GFR calc non Af Amer: >60 mL/min (ref >60)
GFR calc non Af Amer: >60 mL/min (ref >60)
GLUCOSE: 108 mg/dL — AB (ref 65–99)
Glucose, Bld: 119 mg/dL — ABNORMAL HIGH (ref 65–99)
Potassium: 3.9 mmol/L (ref 3.5–5.1)
Potassium: 4.2 mmol/L (ref 3.5–5.1)
Sodium: 138 mmol/L (ref 135–145)
Sodium: 138 mmol/L (ref 135–145)

## 2016-03-16 MED ORDER — METHYLPREDNISOLONE SODIUM SUCC 40 MG IJ SOLR
40.0000 mg | Freq: Two times a day (BID) | INTRAMUSCULAR | Status: DC
  Administered 2016-03-16 – 2016-03-17 (×2): 40 mg via INTRAVENOUS
  Filled 2016-03-16 (×2): qty 1

## 2016-03-16 MED ORDER — IPRATROPIUM-ALBUTEROL 0.5-2.5 (3) MG/3ML IN SOLN
3.0000 mL | Freq: Once | RESPIRATORY_TRACT | Status: AC
  Administered 2016-03-16: 3 mL via RESPIRATORY_TRACT

## 2016-03-16 MED ORDER — ACETAMINOPHEN 325 MG PO TABS: 650.0000 mg | ORAL_TABLET | Freq: Four times a day (QID) | ORAL | Status: DC | PRN

## 2016-03-16 MED ORDER — METHYLPREDNISOLONE SODIUM SUCC 125 MG IJ SOLR
125.0000 mg | Freq: Four times a day (QID) | INTRAMUSCULAR | Status: DC
  Administered 2016-03-16 (×2): 125 mg via INTRAVENOUS
  Filled 2016-03-16 (×2): qty 2

## 2016-03-16 MED ORDER — SODIUM CHLORIDE 0.9 % IV SOLN: 250.0000 mL | INTRAVENOUS | Status: DC | PRN

## 2016-03-16 MED ORDER — HYDROCODONE-ACETAMINOPHEN 5-325 MG PO TABS: 1.0000 | ORAL_TABLET | ORAL | Status: DC | PRN

## 2016-03-16 MED ORDER — IPRATROPIUM-ALBUTEROL 0.5-2.5 (3) MG/3ML IN SOLN
RESPIRATORY_TRACT | Status: AC
  Administered 2016-03-16: 3 mL via RESPIRATORY_TRACT
  Filled 2016-03-16: qty 3

## 2016-03-16 MED ORDER — ONDANSETRON HCL 4 MG PO TABS: 4.0000 mg | ORAL_TABLET | Freq: Four times a day (QID) | ORAL | Status: DC | PRN

## 2016-03-16 MED ORDER — ALBUTEROL SULFATE (2.5 MG/3ML) 0.083% IN NEBU: 2.5000 mg | INHALATION_SOLUTION | RESPIRATORY_TRACT | Status: DC | PRN

## 2016-03-16 MED ORDER — IPRATROPIUM-ALBUTEROL 0.5-2.5 (3) MG/3ML IN SOLN
3.0000 mL | Freq: Four times a day (QID) | RESPIRATORY_TRACT | Status: DC
  Administered 2016-03-16 – 2016-03-17 (×5): 3 mL via RESPIRATORY_TRACT
  Filled 2016-03-16 (×5): qty 3

## 2016-03-16 MED ORDER — METHYLPREDNISOLONE SODIUM SUCC 125 MG IJ SOLR
125.0000 mg | Freq: Once | INTRAMUSCULAR | Status: AC
  Administered 2016-03-16: 125 mg via INTRAVENOUS
  Filled 2016-03-16: qty 2

## 2016-03-16 MED ORDER — IPRATROPIUM-ALBUTEROL 0.5-2.5 (3) MG/3ML IN SOLN
RESPIRATORY_TRACT | Status: AC
  Administered 2016-03-16: 3 mL via RESPIRATORY_TRACT
  Filled 2016-03-16: qty 6

## 2016-03-16 MED ORDER — GUAIFENESIN ER 600 MG PO TB12: 600.0000 mg | ORAL_TABLET | Freq: Two times a day (BID) | ORAL | Status: DC | PRN

## 2016-03-16 MED ORDER — ACETAMINOPHEN 650 MG RE SUPP: 650.0000 mg | Freq: Four times a day (QID) | RECTAL | Status: DC | PRN

## 2016-03-16 MED ORDER — SODIUM CHLORIDE 0.9% FLUSH: 3.0000 mL | INTRAVENOUS | Status: DC | PRN

## 2016-03-16 MED ORDER — SODIUM CHLORIDE 0.9% FLUSH
3.0000 mL | Freq: Two times a day (BID) | INTRAVENOUS | Status: DC
  Administered 2016-03-16 – 2016-03-17 (×3): 3 mL via INTRAVENOUS

## 2016-03-16 MED ORDER — ONDANSETRON HCL 4 MG/2ML IJ SOLN: 4.0000 mg | Freq: Four times a day (QID) | INTRAMUSCULAR | Status: DC | PRN

## 2016-03-16 NOTE — ED Notes (Signed)
Pt states that he started with wheezing yesterday has been using his inhalers and nebs without relief. Pt has audible wheezing, inspiratory and expiratory

## 2016-03-16 NOTE — ED Provider Notes (Signed)
[facility] Emergency Department Provider Note   ____________________________________________  Time seen: Approximately 1:52 AM  I have reviewed the triage vital signs and the nursing notes.   HISTORY  Chief Complaint Wheezing    HPI John Schwartz is a 20 y.o. male who presents to the ED from home with a chief complaint of wheezing. Patient has a history of asthma requiring intubation; last intubated and hospitalized approximately one year ago. Reports wheezing began yesterday secondary to environmental allergies. Has been using his inhalers and albuterol nebulizers without relief of symptoms. Denies associated fever, chills, chest pain, abdominal pain, nausea, vomiting, diarrhea. Denies recent travel or trauma. Nothing makes his symptoms better or worse.   Past Medical History  Diagnosis Date  . Bronchospasm   . Asthma     Patient Active Problem List   Diagnosis Date Noted  . Status asthmaticus 02/11/2014  . Acute respiratory failure (HCC) 02/11/2014    No past surgical history on file.  Current Outpatient Rx  Name  Route  Sig  Dispense  Refill  . albuterol (PROVENTIL HFA;VENTOLIN HFA) 108 (90 BASE) MCG/ACT inhaler   Inhalation   Inhale 2-3 puffs into the lungs every 6 (six) hours as needed for wheezing or shortness of breath.         Marland Kitchen albuterol (PROVENTIL HFA;VENTOLIN HFA) 108 (90 BASE) MCG/ACT inhaler   Inhalation   Inhale 1-2 puffs into the lungs every 6 (six) hours as needed for wheezing or shortness of breath.         Marland Kitchen albuterol (PROVENTIL) (2.5 MG/3ML) 0.083% nebulizer solution   Nebulization   Take 2.5 mg by nebulization every 6 (six) hours as needed for wheezing or shortness of breath.         . beclomethasone (QVAR) 40 MCG/ACT inhaler   Inhalation   Inhale 2 puffs into the lungs every 4 (four) hours.         . ciprofloxacin (CIPRO) 500 MG tablet   Oral   Take 1 tablet (500 mg total) by mouth 2 (two) times daily.   14 tablet   0   . Fluticasone-Salmeterol (ADVAIR DISKUS IN)   Inhalation   Inhale 1 puff into the lungs 2 (two) times daily.         Marland Kitchen guaiFENesin (MUCINEX) 600 MG 12 hr tablet   Oral   Take 600 mg by mouth 2 (two) times daily as needed for cough or to loosen phlegm (congestion).         Marland Kitchen HYDROcodone-acetaminophen (NORCO/VICODIN) 5-325 MG per tablet   Oral   Take 1 tablet by mouth every 4 (four) hours as needed for moderate pain or severe pain.   10 tablet   0   . ibuprofen (ADVIL,MOTRIN) 800 MG tablet   Oral   Take 1 tablet (800 mg total) by mouth 3 (three) times daily.   21 tablet   0   . metroNIDAZOLE (FLAGYL) 500 MG tablet   Oral   Take 1 tablet (500 mg total) by mouth 3 (three) times daily.   21 tablet   0   . predniSONE (DELTASONE) 20 MG tablet   Oral   Take 2 tablets (40 mg total) by mouth daily. Take 40 mg by mouth daily for 3 days, then 20mg  by mouth daily for 3 days, then 10mg  daily for 3 days   12 tablet   0   . predniSONE (DELTASONE) 50 MG tablet      1 tablet  PO daily x 6 days  Start 09/15/14   6 tablet   0     Allergies Review of patient's allergies indicates no known allergies.  Family History  Problem Relation Age of Onset  . Arthritis Paternal Grandmother     Social History Social History  Substance Use Topics  . Smoking status: Never Smoker   . Smokeless tobacco: Not on file  . Alcohol Use: No    Review of Systems  Constitutional: No fever/chills. Eyes: No visual changes. ENT: No sore throat. Cardiovascular: Denies chest pain. Respiratory: Positive for wheezing and shortness of breath. Gastrointestinal: No abdominal pain.  No nausea, no vomiting.  No diarrhea.  No constipation. Genitourinary: Negative for dysuria. Musculoskeletal: Negative for back pain. Skin: Negative for rash. Neurological: Negative for headaches, focal weakness or numbness.  10-point ROS otherwise  negative.  ____________________________________________   PHYSICAL EXAM:  VITAL SIGNS: ED Triage Vitals  Enc Vitals Group     BP 03/16/16 0112 112/99 mmHg     Pulse Rate 03/16/16 0112 74     Resp 03/16/16 0112 22     Temp 03/16/16 0112 97.8 F (36.6 C)     Temp Source 03/16/16 0112 Oral     SpO2 03/16/16 0112 99 %     Weight 03/16/16 0112 155 lb (70.308 kg)     Height 03/16/16 0112  (1.753 m)     Head Cir --      Peak Flow --      Pain Score --      Pain Loc --      Pain Edu? --      Excl. in GC? --     Constitutional: Alert and oriented. Well appearing and in mild acute distress. Eyes: Conjunctivae are normal. PERRL. EOMI. Head: Atraumatic. Nose: No congestion/rhinnorhea. Mouth/Throat: Mucous membranes are moist.  Oropharynx non-erythematous. Neck: No stridor.   Cardiovascular: Normal rate, regular rhythm. Grossly normal heart sounds.  Good peripheral circulation. Respiratory: Increased respiratory effort.  No retractions. Lungs with diffuse wheezing. Gastrointestinal: Soft and nontender. No distention. No abdominal bruits. No CVA tenderness. Musculoskeletal: No lower extremity tenderness nor edema.  No joint effusions. Neurologic:  Normal speech and language. No gross focal neurologic deficits are appreciated. No gait instability. Skin:  Skin is warm, dry and intact. No rash noted. Psychiatric: Mood and affect are normal. Speech and behavior are normal.  ____________________________________________   LABS (all labs ordered are listed, but only abnormal results are displayed)  Labs Reviewed  BASIC METABOLIC PANEL - Abnormal; Notable for the following:    Glucose, Bld 119 (*)    All other components within normal limits  CBC WITH DIFFERENTIAL/PLATELET   ____________________________________________  EKG  None ____________________________________________  RADIOLOGY  Portable chest x-ray (viewed by me, interpreted per Dr. Andria Meuse): No active  disease. ____________________________________________   PROCEDURES  Procedure(s) performed: None  Critical Care performed: No  ____________________________________________   INITIAL IMPRESSION / ASSESSMENT AND PLAN / ED COURSE  Pertinent labs & imaging results that were available during my care of the patient were reviewed by me and considered in my medical decision making (see chart for details).  20 year old male with a history of asthma requiring intubation who presents with asthma exacerbation. He is examined approximately one hour after IV Solu-Medrol administration and after 3 DuoNeb treatments. He has good aeration with room air saturations 100%; however, he remains tachypneic with diffuse wheezing. Will discuss with hospitalist evaluate patient in the emergency department for admission. ____________________________________________  FINAL CLINICAL IMPRESSION(S) / ED DIAGNOSES  Final diagnoses:  Asthma exacerbation attacks, moderate persistent      NEW MEDICATIONS STARTED DURING THIS VISIT:  New Prescriptions   No medications on file     Note:  This document was prepared using Dragon voice recognition software and may include unintentional dictation errors.    Irean HongJade J Sung, MD 03/16/16 [phone]

## 2016-03-16 NOTE — ED Notes (Signed)
MD at bedside. 

## 2016-03-16 NOTE — H&P (Addendum)
[facility] [place] at [facility]   PATIENT NAME: John Schwartz    MR#:  [id number]  DATE OF BIRTH:  Jun 03, 1996  DATE OF ADMISSION:  03/16/2016  PRIMARY CARE PHYSICIAN: No PCP Per Patient   REQUESTING/REFERRING PHYSICIAN:   CHIEF COMPLAINT:   Chief Complaint  Patient presents with  . Wheezing    HISTORY OF PRESENT ILLNESS: John Schwartz  is a 20 y.o. male with a known history of bronchial asthma presented to the emergency room with difficulty breathing for the last 1 day. Patient has wheeze for the last 1 day which has been increasing. Patient tried some inhalation treatment at home but did not help. He came to the emergency room he was given IV Solu-Medrol and DuoNeb nebulizations multiple times. He still continued to have wheezing and shortness of breath. Hospitalist service was consulted for further care. No history of chest pain. No history of fever or chills or cough. No history of recent travel or sick contacts at home. No history of headache dizziness blurry vision.  PAST MEDICAL HISTORY:   Past Medical History  Diagnosis Date  . Bronchospasm   . Asthma     PAST SURGICAL HISTORY: No past surgical history on file.  No surgeries in the past.  SOCIAL HISTORY:  Social History  Substance Use Topics  . Smoking status: Never Smoker   . Smokeless tobacco: Not on file  . Alcohol Use: No    FAMILY HISTORY:  Family History  Problem Relation Age of Onset  . Arthritis Paternal Grandmother    Personal History : Unemployed  DRUG ALLERGIES: No Known Allergies  REVIEW OF SYSTEMS:   CONSTITUTIONAL: No fever, fatigue or weakness.  EYES: No blurred or double vision.  EARS, NOSE, AND THROAT: No tinnitus or ear pain.  RESPIRATORY: No cough, has shortness of breath, has wheezing, no hemoptysis.  CARDIOVASCULAR: No chest pain, orthopnea, edema.  GASTROINTESTINAL: No nausea, vomiting, diarrhea or abdominal pain.  GENITOURINARY: No dysuria, hematuria.   ENDOCRINE: No polyuria, nocturia,  HEMATOLOGY: No anemia, easy bruising or bleeding SKIN: No rash or lesion. MUSCULOSKELETAL: No joint pain or arthritis.   NEUROLOGIC: No tingling, numbness, weakness.  PSYCHIATRY: No anxiety or depression.   MEDICATIONS AT HOME:  Prior to Admission medications   Medication Sig Start Date End Date Taking? Authorizing Provider  albuterol (PROVENTIL HFA;VENTOLIN HFA) 108 (90 BASE) MCG/ACT inhaler Inhale 2-3 puffs into the lungs every 6 (six) hours as needed for wheezing or shortness of breath.    Historical Provider, MD  albuterol (PROVENTIL HFA;VENTOLIN HFA) 108 (90 BASE) MCG/ACT inhaler Inhale 1-2 puffs into the lungs every 6 (six) hours as needed for wheezing or shortness of breath.    Historical Provider, MD  albuterol (PROVENTIL) (2.5 MG/3ML) 0.083% nebulizer solution Take 2.5 mg by nebulization every 6 (six) hours as needed for wheezing or shortness of breath.    Historical Provider, MD  beclomethasone (QVAR) 40 MCG/ACT inhaler Inhale 2 puffs into the lungs every 4 (four) hours.    Historical Provider, MD  ciprofloxacin (CIPRO) 500 MG tablet Take 1 tablet (500 mg total) by mouth 2 (two) times daily. 09/05/14   Mellody Drown, PA-C  Fluticasone-Salmeterol (ADVAIR DISKUS IN) Inhale 1 puff into the lungs 2 (two) times daily.    Historical Provider, MD  guaiFENesin (MUCINEX) 600 MG 12 hr tablet Take 600 mg by mouth 2 (two) times daily as needed for cough or to loosen phlegm (congestion).    Historical Provider, MD  HYDROcodone-acetaminophen (NORCO/VICODIN) 5-325 MG per tablet Take 1 tablet by mouth every 4 (four) hours as needed for moderate pain or severe pain. 09/05/14   Mellody DrownLauren Parker, PA-C  ibuprofen (ADVIL,MOTRIN) 800 MG tablet Take 1 tablet (800 mg total) by mouth 3 (three) times daily. 09/05/14   Mellody DrownLauren Parker, PA-C  metroNIDAZOLE (FLAGYL) 500 MG tablet Take 1 tablet (500 mg total) by mouth 3 (three) times daily. 09/05/14   Mellody DrownLauren Parker, PA-C  predniSONE  (DELTASONE) 20 MG tablet Take 2 tablets (40 mg total) by mouth daily. Take 40 mg by mouth daily for 3 days, then 20mg  by mouth daily for 3 days, then 10mg  daily for 3 days 08/25/14   Garlon HatchetLisa M Sanders, PA-C  predniSONE (DELTASONE) 50 MG tablet 1 tablet PO daily x 6 days  Start 09/15/14 09/14/14   Trixie DredgeEmily West, PA-C      PHYSICAL EXAMINATION:   VITAL SIGNS: Blood pressure 112/99, pulse 74, temperature 97.8 F (36.6 C), temperature source Oral, resp. rate 22, height 5\' 9"  (1.753 m), weight 70.308 kg (155 lb), SpO2 99 %.  GENERAL:  20 y.o.-year-old patient lying in the bed with no acute distress.  EYES: Pupils equal, round, reactive to light and accommodation. No scleral icterus. Extraocular muscles intact.  HEENT: Head atraumatic, normocephalic. Oropharynx and nasopharynx clear.  NECK:  Supple, no jugular venous distention. No thyroid enlargement, no tenderness.  LUNGS: Decreased breath sounds bilaterally,  Wheezing noted in both lung fields, no rales,rhonchi or crepitation. No use of accessory muscles of respiration.  CARDIOVASCULAR: S1, S2 normal. No murmurs, rubs, or gallops.  ABDOMEN: Soft, nontender, nondistended. Bowel sounds present. No organomegaly or mass.  EXTREMITIES: No pedal edema, cyanosis, or clubbing.  NEUROLOGIC: Cranial nerves II through XII are intact. Muscle strength 5/5 in all extremities. Sensation intact. Gait normal. PSYCHIATRIC: The patient is alert and oriented x 3.  SKIN: No obvious rash, lesion, or ulcer.   LABORATORY PANEL:   CBC No results for input(s): WBC, HGB, HCT, PLT, MCV, MCH, MCHC, RDW, LYMPHSABS, MONOABS, EOSABS, BASOSABS, BANDABS in the last 168 hours.  Invalid input(s): NEUTRABS, BANDSABD ------------------------------------------------------------------------------------------------------------------  Chemistries   Recent Labs Lab 03/16/16 0142  NA 138  K 3.9  CL 106  CO2 26  GLUCOSE 119*  BUN 12  CREATININE 1.04  CALCIUM 9.5    ------------------------------------------------------------------------------------------------------------------ estimated creatinine clearance is 112.7 mL/min (by C-G formula based on Cr of 1.04). ------------------------------------------------------------------------------------------------------------------ No results for input(s): TSH, T4TOTAL, T3FREE, THYROIDAB in the last 72 hours.  Invalid input(s): FREET3   Coagulation profile No results for input(s): INR, PROTIME in the last 168 hours. ------------------------------------------------------------------------------------------------------------------- No results for input(s): DDIMER in the last 72 hours. -------------------------------------------------------------------------------------------------------------------  Cardiac Enzymes No results for input(s): CKMB, TROPONINI, MYOGLOBIN in the last 168 hours.  Invalid input(s): CK ------------------------------------------------------------------------------------------------------------------ Invalid input(s): POCBNP  ---------------------------------------------------------------------------------------------------------------  Urinalysis No results found for: COLORURINE, APPEARANCEUR, LABSPEC, PHURINE, GLUCOSEU, HGBUR, BILIRUBINUR, KETONESUR, PROTEINUR, UROBILINOGEN, NITRITE, LEUKOCYTESUR   RADIOLOGY: Dg Chest Port 1 View  03/16/2016  CLINICAL DATA:  Wheezing for 2 days. EXAM: PORTABLE CHEST 1 VIEW COMPARISON:  09/17/2015 FINDINGS: Mild hyperinflation. Normal heart size and pulmonary vascularity. No focal airspace disease or consolidation in the lungs. No blunting of costophrenic angles. No pneumothorax. Mediastinal contours appear intact. IMPRESSION: No active disease. Electronically Signed   By: Burman NievesWilliam  Stevens M.D.   On: 03/16/2016 02:09    EKG: Orders placed or performed during the hospital encounter of 09/17/15  . EKG 12-Lead  . EKG 12-Lead  . EKG  IMPRESSION AND PLAN: 20 year old male patient with history of bronchial last presented to the emergency room with shortness of breath and wheezing. Admitting diagnosis 1. Acute bronchial asthma exacerbation 2. Dyspnea secondary to asthma exacerbation 3. Wheezing Treatment plan Admit patient to medical floor observation bed IV Solu-Medrol 125 mg every 6 hourly Duoneb treatments around-the-clock Oxygen as needed Sequential compression device to legs for DVT prophylaxis.  All the records are reviewed and case discussed with ED provider. Management plans discussed with the patient, family and they are in agreement.  CODE STATUS: Code Status History    Date Active Date Inactive Code Status Order ID Comments User Context   02/11/2014  7:07 PM 02/14/2014  9:10 PM Full Code [id number]  Nash Shearer, MD ED       TOTAL TIME TAKING CARE OF THIS PATIENT: 48 minutes.    [name] M.D on 03/16/2016 at 2:49 AM  Between 7am to 6pm - Pager - [phone]  After 6pm go to [email or web address] - password [email or web address] [facility]  [place]  Hospitalists  Office  [phone]  CC: Primary care physician; No PCP Per Patient

## 2016-03-16 NOTE — Progress Notes (Signed)
[facility] - [place] at [facility]   PATIENT NAME: John Schwartz    MR#:  [id number]  DATE OF BIRTH:  Aug 26, 1996  SUBJECTIVE:  CHIEF COMPLAINT:   Chief Complaint  Patient presents with  . Wheezing   Still shortness breath and wheezing. REVIEW OF SYSTEMS:  CONSTITUTIONAL: No fever, fatigue or weakness.  EYES: No blurred or double vision.  EARS, NOSE, AND THROAT: No tinnitus or ear pain.  RESPIRATORY: has cough, shortness of breath, wheezing but no hemoptysis.  CARDIOVASCULAR: No chest pain, orthopnea, edema.  GASTROINTESTINAL: No nausea, vomiting, diarrhea or abdominal pain.  GENITOURINARY: No dysuria, hematuria.  ENDOCRINE: No polyuria, nocturia,  HEMATOLOGY: No anemia, easy bruising or bleeding SKIN: No rash or lesion. MUSCULOSKELETAL: No joint pain or arthritis.   NEUROLOGIC: No tingling, numbness, weakness.  PSYCHIATRY: No anxiety or depression.   DRUG ALLERGIES:  No Known Allergies  VITALS:  Blood pressure 126/61, pulse 78, temperature 98.4 F (36.9 C), temperature source Oral, resp. rate 16, height  (1.753 m), weight 71.351 kg (157 lb 4.8 oz), SpO2 98 %.  PHYSICAL EXAMINATION:  GENERAL:  20 y.o.-year-old patient lying in the bed with no acute distress.  EYES: Pupils equal, round, reactive to light and accommodation. No scleral icterus. Extraocular muscles intact.  HEENT: Head atraumatic, normocephalic. Oropharynx and nasopharynx clear.  NECK:  Supple, no jugular venous distention. No thyroid enlargement, no tenderness.  LUNGS: Normal breath sounds bilaterally,  Expiratory wheezing, no rales,rhonchi or crepitation. No use of accessory muscles of respiration.  CARDIOVASCULAR: S1, S2 normal. No murmurs, rubs, or gallops.  ABDOMEN: Soft, nontender, nondistended. Bowel sounds present. No organomegaly or mass.  EXTREMITIES: No pedal edema, cyanosis, or clubbing.  NEUROLOGIC: Cranial nerves II through XII are intact. Muscle strength 5/5 in all  extremities. Sensation intact. Gait not checked.  PSYCHIATRIC: The patient is alert and oriented x 3.  SKIN: No obvious rash, lesion, or ulcer.    LABORATORY PANEL:   CBC  Recent Labs Lab 03/16/16 0409  WBC 10.3  HGB 14.5  HCT 42.8  PLT 243   ------------------------------------------------------------------------------------------------------------------  Chemistries   Recent Labs Lab 03/16/16 0409  NA 138  K 4.2  CL 106  CO2 24  GLUCOSE 108*  BUN 12  CREATININE 1.08  CALCIUM 9.4   ------------------------------------------------------------------------------------------------------------------  Cardiac Enzymes No results for input(s): TROPONINI in the last 168 hours. ------------------------------------------------------------------------------------------------------------------  RADIOLOGY:  Dg Chest Port 1 View  03/16/2016  CLINICAL DATA:  Wheezing for 2 days. EXAM: PORTABLE CHEST 1 VIEW COMPARISON:  09/17/2015 FINDINGS: Mild hyperinflation. Normal heart size and pulmonary vascularity. No focal airspace disease or consolidation in the lungs. No blunting of costophrenic angles. No pneumothorax. Mediastinal contours appear intact. IMPRESSION: No active disease. Electronically Signed   By: Burman Nieves M.D.   On: 03/16/2016 02:09    EKG:   Orders placed or performed during the hospital encounter of 09/17/15  . EKG 12-Lead  . EKG 12-Lead  . EKG    ASSESSMENT AND PLAN:   20 year old male patient with history of bronchial last presented to the emergency room with shortness of breath and wheezing.   Acute bronchial asthma exacerbation Continue IV Solu-Medrol and Duoneb treatments around-the-clock. Mucinex every 12 hours.    All the records are reviewed and case discussed with Care Management/Social Workerr. Management plans discussed with the patient, family and they are in agreement.  CODE STATUS: Full code  TOTAL TIME TAKING CARE OF THIS PATIENT:  33 minutes.  Greater  than 50% time was spent on coordination of care and face-to-face counseling.  POSSIBLE D/C IN 1-2 DAYS, DEPENDING ON CLINICAL CONDITION.   Shaune Pollackhen, Jamesyn Lindell M.D on 03/16/2016 at 3:46 PM  Between 7am to 6pm - Pager - [phone]  After 6pm go to [email or web address] - password [email or web address] [facility]  [place] [facility]  Office  [phone]  CC: Primary care physician; No PCP Per Patient

## 2016-03-16 NOTE — Care Management Note (Addendum)
Case Management Note  Patient Details  Name: John Schwartz MRN: [id number] Date of Birth: 11-27-1995  Subjective/Objective:                   Met with patient to discuss discharge planning as he frequents the ED with asthma and injuries. He claims that he is safe at home and not being abused. He states that he has not been to his PCP in years. He had follow up in 2010 with [facility] but was a no-show. He lives with his grandmother John Schwartz. His mother's number is listed as emergency contact. He agrees to follow up appointment/establishment with [facility] on 03/23/16 at [place]. He states he has transportation. He states his nebulizer is old and needs a new one. He states his Medicaid covers his medications. Action/Plan: Nebulizer requested from [place] [facility]; Rx is in patient's medical record. No other RNCM needs.   Expected Discharge Date:                  Expected Discharge Plan:     In-House Referral:     Discharge planning Services  CM Consult, Follow-up appt scheduled  Post Acute Care Choice:  Durable Medical Equipment Choice offered to:     DME Arranged:  Nebulizer machine DME Agency:     HH Arranged:    [place] Agency:     Status of Service:  Completed, signed off  Medicare Important Message Given:    Date Medicare IM Given:    Medicare IM give by:    Date Additional Medicare IM Given:    Additional Medicare Important Message give by:     If discussed at [place] of Stay Meetings, dates discussed:    Additional Comments: Nebulizer has been delivered.   John Garfinkel, RN 03/16/2016, 10:36 AM

## 2016-03-17 MED ORDER — GUAIFENESIN ER 600 MG PO TB12: 600.0000 mg | ORAL_TABLET | Freq: Two times a day (BID) | ORAL | Status: DC | PRN

## 2016-03-17 MED ORDER — ALBUTEROL SULFATE (2.5 MG/3ML) 0.083% IN NEBU: 2.5000 mg | INHALATION_SOLUTION | Freq: Four times a day (QID) | RESPIRATORY_TRACT | Status: DC | PRN

## 2016-03-17 NOTE — Progress Notes (Signed)
John Schwartz to be D/C'd Home per MD order.  Discussed with the patient and all questions fully answered.  VSS, Skin clean, dry and intact without evidence of skin break down, no evidence of skin tears noted. IV catheter discontinued intact. Site without signs and symptoms of complications. Dressing and pressure applied.  An After Visit Summary was printed and given to the patient. Patient received prescription.  D/c education completed with patient/family including follow up instructions, medication list, d/c activities limitations if indicated, with other d/c instructions as indicated by MD - patient able to verbalize understanding, all questions fully answered.   Patient instructed to return to ED, call 911, or call MD for any changes in condition.   Patient escorted via WC, and D/C home via private auto.  [name] 03/17/2016 11:04 AM

## 2016-03-17 NOTE — Care Management (Signed)
Nebulizer delivered.No other RNCM needs.

## 2016-03-17 NOTE — Discharge Instructions (Signed)
Regular diet. °Activity as tolerated. °

## 2016-03-17 NOTE — Discharge Summary (Signed)
[facility] [place] [facility]   PATIENT NAME: John Schwartz    MR#:  [id number]  DATE OF BIRTH:  1996/08/08  DATE OF ADMISSION:  03/16/2016 ADMITTING PHYSICIAN: Ihor AustinPavan Pyreddy, MD  DATE OF DISCHARGE: 03/17/2016 PRIMARY CARE PHYSICIAN: No PCP Per Patient    ADMISSION DIAGNOSIS:  Asthma exacerbation attacks, moderate persistent [J45.41]   DISCHARGE DIAGNOSIS:   Acute bronchial asthma exacerbation SECONDARY DIAGNOSIS:   Past Medical History  Diagnosis Date  . Bronchospasm   . Asthma     HOSPITAL COURSE:   20 year old male patient with history of bronchial last presented to the emergency room with shortness of breath and wheezing.   Acute bronchial asthma exacerbation He has been treated with IV Solu-Medrol and Duoneb treatments around-the-clock. Mucinex every 12 hours prn. His symptoms has improved.  DISCHARGE CONDITIONS:   Stable, discharge to home today.  CONSULTS OBTAINED:     DRUG ALLERGIES:  No Known Allergies  DISCHARGE MEDICATIONS:   Current Discharge Medication List    CONTINUE these medications which have CHANGED   Details  albuterol (PROVENTIL) (2.5 MG/3ML) 0.083% nebulizer solution Take 3 mLs (2.5 mg total) by nebulization every 6 (six) hours as needed for wheezing or shortness of breath. Qty: 75 mL, Refills: 2    guaiFENesin (MUCINEX) 600 MG 12 hr tablet Take 1 tablet (600 mg total) by mouth 2 (two) times daily as needed for cough or to loosen phlegm (congestion). Reported on 03/16/2016 Qty: 20 tablet, Refills: 0      CONTINUE these medications which have NOT CHANGED   Details  albuterol (PROVENTIL HFA;VENTOLIN HFA) 108 (90 BASE) MCG/ACT inhaler Inhale 1-2 puffs into the lungs every 6 (six) hours as needed for wheezing or shortness of breath.    Fluticasone-Salmeterol (ADVAIR DISKUS IN) Inhale 1 puff into the lungs 2 (two) times daily.         DISCHARGE INSTRUCTIONS:    If you experience worsening of your  admission symptoms, develop shortness of breath, life threatening emergency, suicidal or homicidal thoughts you must seek medical attention immediately by calling 911 or calling your MD immediately  if symptoms less severe.  You Must read complete instructions/literature along with all the possible adverse reactions/side effects for all the Medicines you take and that have been prescribed to you. Take any new Medicines after you have completely understood and accept all the possible adverse reactions/side effects.   Please note  You were cared for by a hospitalist during your hospital stay. If you have any questions about your discharge medications or the care you received while you were in the hospital after you are discharged, you can call the unit and asked to speak with the hospitalist on call if the hospitalist that took care of you is not available. Once you are discharged, your primary care physician will handle any further medical issues. Please note that NO REFILLS for any discharge medications will be authorized once you are discharged, as it is imperative that you return to your primary care physician (or establish a relationship with a primary care physician if you do not have one) for your aftercare needs so that they can reassess your need for medications and monitor your lab values.    Today   SUBJECTIVE    No complaint.  VITAL SIGNS:  Blood pressure 114/55, pulse 83, temperature 98.7 F (37.1 C), temperature source Oral, resp. rate 16, height 5\' 9"  (1.753 m), weight 71.351 kg (157 lb 4.8 oz), SpO2 98 %.  I/O:   Intake/Output Summary (Last 24 hours) at 03/17/16 1110 Last data filed at 03/17/16 0900  Gross per 24 hour  Intake    480 ml  Output      0 ml  Net    480 ml    PHYSICAL EXAMINATION:  GENERAL:  20 y.o.-year-old patient lying in the bed with no acute distress.  EYES: Pupils equal, round, reactive to light and accommodation. No scleral icterus. Extraocular  muscles intact.  HEENT: Head atraumatic, normocephalic. Oropharynx and nasopharynx clear.  NECK:  Supple, no jugular venous distention. No thyroid enlargement, no tenderness.  LUNGS: Normal breath sounds bilaterally, no wheezing, rales,rhonchi or crepitation. No use of accessory muscles of respiration.  CARDIOVASCULAR: S1, S2 normal. No murmurs, rubs, or gallops.  ABDOMEN: Soft, non-tender, non-distended. Bowel sounds present. No organomegaly or mass.  EXTREMITIES: No pedal edema, cyanosis, or clubbing.  NEUROLOGIC: Cranial nerves II through XII are intact. Muscle strength 5/5 in all extremities. Sensation intact. Gait not checked.  PSYCHIATRIC: The patient is alert and oriented x 3.  SKIN: No obvious rash, lesion, or ulcer.   DATA REVIEW:   CBC  Recent Labs Lab 03/16/16 0409  WBC 10.3  HGB 14.5  HCT 42.8  PLT 243    Chemistries   Recent Labs Lab 03/16/16 0409  NA 138  K 4.2  CL 106  CO2 24  GLUCOSE 108*  BUN 12  CREATININE 1.08  CALCIUM 9.4    Cardiac Enzymes No results for input(s): TROPONINI in the last 168 hours.  Microbiology Results  Results for orders placed or performed during the hospital encounter of 02/11/14  Rapid strep screen     Status: None   Collection Time: 02/11/14  2:20 PM  Result Value Ref Range Status   Streptococcus, Group A Screen (Direct) NEGATIVE NEGATIVE Final    Comment: (NOTE) A Rapid Antigen test may result negative if the antigen level in the sample is below the detection level of this test. The FDA has not cleared this test as a stand-alone test therefore the rapid antigen negative result has reflexed to a Group A Strep culture.  Culture, Group A Strep     Status: None   Collection Time: 02/11/14  2:20 PM  Result Value Ref Range Status   Specimen Description THROAT  Final   Special Requests NONE  Final   Culture   Final    No Beta Hemolytic Streptococci Isolated Performed at [facility]   Report Status 02/13/2014  FINAL  Final    RADIOLOGY:  Dg Chest Port 1 View  03/16/2016  CLINICAL DATA:  Wheezing for 2 days. EXAM: PORTABLE CHEST 1 VIEW COMPARISON:  09/17/2015 FINDINGS: Mild hyperinflation. Normal heart size and pulmonary vascularity. No focal airspace disease or consolidation in the lungs. No blunting of costophrenic angles. No pneumothorax. Mediastinal contours appear intact. IMPRESSION: No active disease. Electronically Signed   By: Burman Nieves M.D.   On: 03/16/2016 02:09        Management plans discussed with the patient, family and they are in agreement.  CODE STATUS:     Code Status Orders        Start     Ordered   03/16/16 0331  Full code   Continuous     03/16/16 0330    Code Status History    Date Active Date Inactive Code Status Order ID Comments User Context   02/11/2014  7:07 PM 02/14/2014  9:10 PM Full Code [id number]  Nash Shearer, MD ED      TOTAL TIME TAKING CARE OF THIS PATIENT: 26 minutes.    Shaune Pollack M.D on 03/17/2016 at 11:10 AM  Between 7am to 6pm - Pager - [phone]  After 6pm go to [email or web address] - password [email or web address] [facility]  [address] [place] [facility]  Office  [phone]  CC: Primary care physician; No PCP Per Patient

## 2016-04-07 ENCOUNTER — Encounter: Payer: Self-pay | Admitting: *Deleted

## 2016-04-07 ENCOUNTER — Emergency Department
Admission: EM | Admit: 2016-04-07 | Discharge: 2016-04-07 | Disposition: A | Discharge status: Home / Self Care | Payer: Medicaid Other | Attending: Emergency Medicine | Admitting: Emergency Medicine

## 2016-04-07 ENCOUNTER — Emergency Department: Payer: Medicaid Other

## 2016-04-07 DIAGNOSIS — R05 Cough: Secondary | ICD-10-CM | POA: Diagnosis present

## 2016-04-07 DIAGNOSIS — J4521 Mild intermittent asthma with (acute) exacerbation: Secondary | ICD-10-CM

## 2016-04-07 DIAGNOSIS — F1210 Cannabis abuse, uncomplicated: Secondary | ICD-10-CM | POA: Diagnosis not present

## 2016-04-07 DIAGNOSIS — J9600 Acute respiratory failure, unspecified whether with hypoxia or hypercapnia: Secondary | ICD-10-CM | POA: Diagnosis not present

## 2016-04-07 MED ORDER — ALBUTEROL SULFATE HFA 108 (90 BASE) MCG/ACT IN AERS: 2.0000 | INHALATION_SPRAY | Freq: Four times a day (QID) | RESPIRATORY_TRACT | Status: DC | PRN

## 2016-04-07 MED ORDER — PREDNISONE 10 MG PO TABS: 50.0000 mg | ORAL_TABLET | Freq: Every day | ORAL | Status: DC

## 2016-04-07 MED ORDER — AZITHROMYCIN 250 MG PO TABS: ORAL_TABLET | ORAL | Status: DC

## 2016-04-07 MED ORDER — GUAIFENESIN-CODEINE 100-10 MG/5ML PO SOLN: 10.0000 mL | ORAL | Status: DC | PRN

## 2016-04-07 MED ORDER — IPRATROPIUM-ALBUTEROL 0.5-2.5 (3) MG/3ML IN SOLN
3.0000 mL | Freq: Once | RESPIRATORY_TRACT | Status: AC
  Administered 2016-04-07: 3 mL via RESPIRATORY_TRACT
  Filled 2016-04-07: qty 3

## 2016-04-07 NOTE — ED Provider Notes (Signed)
[facility] Emergency Department Provider Note ____________________________________________  Time seen: Approximately 10:54 AM  I have reviewed the triage vital signs and the nursing notes.   HISTORY  Chief Complaint Asthma    HPI John Schwartz is a 20 y.o. male who presents for evaluation of an asthma flareup. Patient states that he has an inhaler but is almost fell but he been wheezing and coughing for the last several days. Concerned about infection.   Past Medical History  Diagnosis Date  . Bronchospasm   . Asthma     Patient Active Problem List   Diagnosis Date Noted  . Dyspnea 03/16/2016  . Asthma exacerbation 03/16/2016  . Status asthmaticus 02/11/2014  . Acute respiratory failure (HCC) 02/11/2014    History reviewed. No pertinent past surgical history.  Current Outpatient Rx  Name  Route  Sig  Dispense  Refill  . albuterol (PROVENTIL HFA;VENTOLIN HFA) 108 (90 Base) MCG/ACT inhaler   Inhalation   Inhale 2 puffs into the lungs every 6 (six) hours as needed for wheezing or shortness of breath.   1 Inhaler   2   . azithromycin (ZITHROMAX Z-PAK) 250 MG tablet      Take 2 tablets (500 mg) on  Day 1,  followed by 1 tablet (250 mg) once daily on Days 2 through 5.   6 each   0   . Fluticasone-Salmeterol (ADVAIR DISKUS IN)   Inhalation   Inhale 1 puff into the lungs 2 (two) times daily.         Marland Kitchen. guaiFENesin-codeine 100-10 MG/5ML syrup   Oral   Take 10 mLs by mouth every 4 (four) hours as needed for cough.   180 mL   0   . predniSONE (DELTASONE) 10 MG tablet   Oral   Take 5 tablets (50 mg total) by mouth daily with breakfast.   25 tablet   0     Allergies Review of patient's allergies indicates no known allergies.  Family History  Problem Relation Age of Onset  . Arthritis Paternal Grandmother     Social History Social History  Substance Use Topics  . Smoking status: Never Smoker   . Smokeless tobacco: None  .  Alcohol Use: No    Review of Systems Constitutional: No fever/chills Cardiovascular: Denies chest pain. Respiratory: Occasional shortness of breath with coughing and wheezing. Gastrointestinal: No abdominal pain.  No nausea, no vomiting.  No diarrhea.  No constipation. Musculoskeletal: Negative for back pain. Neurological: Negative for headaches, focal weakness or numbness.  10-point ROS otherwise negative.  ____________________________________________   PHYSICAL EXAM:  VITAL SIGNS: ED Triage Vitals  Enc Vitals Group     BP 04/07/16 1027 133/74 mmHg     Pulse Rate 04/07/16 1027 88     Resp 04/07/16 1027 20     Temp 04/07/16 1027 98.2 F (36.8 C)     Temp src --      SpO2 04/07/16 1027 97 %     Weight 04/07/16 1027 165 lb (74.844 kg)     Height 04/07/16 1027 5\' 10"  (1.778 m)     Head Cir --      Peak Flow --      Pain Score --      Pain Loc --      Pain Edu? --      Excl. in GC? --     Constitutional: Alert and oriented. Well appearing and in no acute distress. Nose: No congestion/rhinnorhea. Mouth/Throat: Mucous  membranes are moist.  Oropharynx non-erythematous. Neck: No stridor.  Full range of motion nontender. Cardiovascular: Normal rate, regular rhythm. Grossly normal heart sounds.  Good peripheral circulation. Respiratory: Normal respiratory effort.  No retractions. Lungs decreased breath sounds bilaterally with coarse breath sounds noted and scattered wheezing Skin:  Skin is warm, dry and intact. No rash noted. Psychiatric: Mood and affect are normal. Speech and behavior are normal.  ____________________________________________   LABS (all labs ordered are listed, but only abnormal results are displayed)  Labs Reviewed - No data to display ____________________________________________  RADIOLOGY  No acute cardiopulmonary findings. ____________________________________________   PROCEDURES  Procedure(s) performed: None  Critical Care performed:  No  ____________________________________________   INITIAL IMPRESSION / ASSESSMENT AND PLAN / ED COURSE  Pertinent labs & imaging results that were available during my care of the patient were reviewed by me and considered in my medical decision making (see chart for details).  Acute exacerbation of asthma with URI. Rx given for albuterol inhaler, prednisone, Robitussin-AC and a Z-Pak. Patient follow-up PCP or return to ER with any worsening symptomology. ____________________________________________   FINAL CLINICAL IMPRESSION(S) / ED DIAGNOSES  Final diagnoses:  Asthma exacerbation attacks, mild intermittent     This chart was dictated using voice recognition software/Dragon. Despite best efforts to proofread, errors can occur which can change the meaning. Any change was purely unintentional.   Evangeline Dakin, PA-C 04/07/16 1215  Jene Every, MD 04/07/16 [phone]

## 2016-04-07 NOTE — Discharge Instructions (Signed)
Asthma, Adult Asthma is a recurring condition in which the airways tighten and narrow. Asthma can make it difficult to breathe. It can cause coughing, wheezing, and shortness of breath. Asthma episodes, also called asthma attacks, range from minor to life-threatening. Asthma cannot be cured, but medicines and lifestyle changes can help control it. CAUSES Asthma is believed to be caused by inherited (genetic) and environmental factors, but its exact cause is unknown. Asthma may be triggered by allergens, lung infections, or irritants in the air. Asthma triggers are different for each person. Common triggers include:   Animal dander.  Dust mites.  Cockroaches.  Pollen from trees or grass.  Mold.  Smoke.  Air pollutants such as dust, household cleaners, hair sprays, aerosol sprays, paint fumes, strong chemicals, or strong odors.  Cold air, weather changes, and winds (which increase molds and pollens in the air).  Strong emotional expressions such as crying or laughing hard.  Stress.  Certain medicines (such as aspirin) or types of drugs (such as beta-blockers).  Sulfites in foods and drinks. Foods and drinks that may contain sulfites include dried fruit, potato chips, and sparkling grape juice.  Infections or inflammatory conditions such as the flu, a cold, or an inflammation of the nasal membranes (rhinitis).  Gastroesophageal reflux disease (GERD).  Exercise or strenuous activity. SYMPTOMS Symptoms may occur immediately after asthma is triggered or many hours later. Symptoms include:  Wheezing.  Excessive nighttime or early morning coughing.  Frequent or severe coughing with a common cold.  Chest tightness.  Shortness of breath. DIAGNOSIS  The diagnosis of asthma is made by a review of your medical history and a physical exam. Tests may also be performed. These may include:  Lung function studies. These tests show how much air you breathe in and out.  Allergy  tests.  Imaging tests such as X-rays. TREATMENT  Asthma cannot be cured, but it can usually be controlled. Treatment involves identifying and avoiding your asthma triggers. It also involves medicines. There are 2 classes of medicine used for asthma treatment:   Controller medicines. These prevent asthma symptoms from occurring. They are usually taken every day.  Reliever or rescue medicines. These quickly relieve asthma symptoms. They are used as needed and provide short-term relief. Your health care provider will help you create an asthma action plan. An asthma action plan is a written plan for managing and treating your asthma attacks. It includes a list of your asthma triggers and how they may be avoided. It also includes information on when medicines should be taken and when their dosage should be changed. An action plan may also involve the use of a device called a peak flow meter. A peak flow meter measures how well the lungs are working. It helps you monitor your condition. HOME CARE INSTRUCTIONS   Take medicines only as directed by your health care provider. Speak with your health care provider if you have questions about how or when to take the medicines.  Use a peak flow meter as directed by your health care provider. Record and keep track of readings.  Understand and use the action plan to help minimize or stop an asthma attack without needing to seek medical care.  Control your home environment in the following ways to help prevent asthma attacks:  Do not smoke. Avoid being exposed to secondhand smoke.  Change your heating and air conditioning filter regularly.  Limit your use of fireplaces and wood stoves.  Get rid of pests (such as roaches   and mice) and their droppings.  Throw away plants if you see mold on them.  Clean your floors and dust regularly. Use unscented cleaning products.  Try to have someone else vacuum for you regularly. Stay out of rooms while they are  being vacuumed and for a short while afterward. If you vacuum, use a dust mask from a hardware store, a double-layered or microfilter vacuum cleaner bag, or a vacuum cleaner with a HEPA filter.  Replace carpet with wood, tile, or vinyl flooring. Carpet can trap dander and dust.  Use allergy-proof pillows, mattress covers, and box spring covers.  Wash bed sheets and blankets every week in hot water and dry them in a dryer.  Use blankets that are made of polyester or cotton.  Clean bathrooms and kitchens with bleach. If possible, have someone repaint the walls in these rooms with mold-resistant paint. Keep out of the rooms that are being cleaned and painted.  Wash hands frequently. SEEK MEDICAL CARE IF:   You have wheezing, shortness of breath, or a cough even if taking medicine to prevent attacks.  The colored mucus you cough up (sputum) is thicker than usual.  Your sputum changes from clear or white to yellow, green, gray, or bloody.  You have any problems that may be related to the medicines you are taking (such as a rash, itching, swelling, or trouble breathing).  You are using a reliever medicine more than 2-3 times per week.  Your peak flow is still at 50-79% of your personal best after following your action plan for 1 hour.  You have a fever. SEEK IMMEDIATE MEDICAL CARE IF:   You seem to be getting worse and are unresponsive to treatment during an asthma attack.  You are short of breath even at rest.  You get short of breath when doing very little physical activity.  You have difficulty eating, drinking, or talking due to asthma symptoms.  You develop chest pain.  You develop a fast heartbeat.  You have a bluish color to your lips or fingernails.  You are light-headed, dizzy, or faint.  Your peak flow is less than 50% of your personal best.   This information is not intended to replace advice given to you by your health care provider. Make sure you discuss any  questions you have with your health care provider.   Document Released: 11/02/2005 Document Revised: 07/24/2015 Document Reviewed: 06/01/2013 Elsevier Interactive Patient Education 2016 [employer]  

## 2016-04-07 NOTE — ED Notes (Signed)
Pt reports having a cold with asthma flaring per pt, no audible wheezes heard in triage, pt is in no distress

## 2016-06-03 ENCOUNTER — Emergency Department (HOSPITAL_COMMUNITY)
Admission: EM | Admit: 2016-06-03 | Discharge: 2016-06-03 | Disposition: A | Discharge status: Home / Self Care | Payer: Medicaid Other | Attending: Emergency Medicine | Admitting: Emergency Medicine

## 2016-06-03 ENCOUNTER — Encounter (HOSPITAL_COMMUNITY): Payer: Self-pay | Admitting: Emergency Medicine

## 2016-06-03 ENCOUNTER — Encounter (HOSPITAL_COMMUNITY): Payer: Self-pay

## 2016-06-03 DIAGNOSIS — Z91120 Patient's intentional underdosing of medication regimen due to financial hardship: Secondary | ICD-10-CM

## 2016-06-03 DIAGNOSIS — E876 Hypokalemia: Secondary | ICD-10-CM | POA: Diagnosis present

## 2016-06-03 DIAGNOSIS — T486X6A Underdosing of antiasthmatics, initial encounter: Secondary | ICD-10-CM | POA: Diagnosis present

## 2016-06-03 DIAGNOSIS — J45909 Unspecified asthma, uncomplicated: Secondary | ICD-10-CM | POA: Diagnosis present

## 2016-06-03 DIAGNOSIS — Z23 Encounter for immunization: Secondary | ICD-10-CM

## 2016-06-03 DIAGNOSIS — Z5321 Procedure and treatment not carried out due to patient leaving prior to being seen by health care provider: Secondary | ICD-10-CM | POA: Diagnosis not present

## 2016-06-03 DIAGNOSIS — J9600 Acute respiratory failure, unspecified whether with hypoxia or hypercapnia: Secondary | ICD-10-CM | POA: Diagnosis present

## 2016-06-03 DIAGNOSIS — Z9114 Patient's other noncompliance with medication regimen: Secondary | ICD-10-CM

## 2016-06-03 DIAGNOSIS — N289 Disorder of kidney and ureter, unspecified: Secondary | ICD-10-CM | POA: Diagnosis present

## 2016-06-03 DIAGNOSIS — J45902 Unspecified asthma with status asthmaticus: Principal | ICD-10-CM | POA: Diagnosis present

## 2016-06-03 MED ORDER — ALBUTEROL SULFATE (2.5 MG/3ML) 0.083% IN NEBU
5.0000 mg | INHALATION_SOLUTION | Freq: Once | RESPIRATORY_TRACT | Status: AC
  Administered 2016-06-03: 5 mg via RESPIRATORY_TRACT

## 2016-06-03 MED ORDER — ALBUTEROL SULFATE (2.5 MG/3ML) 0.083% IN NEBU
INHALATION_SOLUTION | RESPIRATORY_TRACT | Status: AC
  Filled 2016-06-03: qty 6

## 2016-06-03 NOTE — ED Notes (Signed)
Pt. reports asthma attack with wheezing /productive cough onset 2 days ago unrelieved by MDI and home nebulizer treatments.

## 2016-06-03 NOTE — ED Notes (Signed)
Called pt and he states that he left over an hour ago because he had to pick up someone.  States he is now waiting on a ride and is coming back but it will be a few hours.

## 2016-06-03 NOTE — ED Notes (Signed)
Patient here with 2 days of shortness of breath and states his lungs feel tight. States been using neb and inhaler with minimal relief, speaking complete sentences.

## 2016-06-03 NOTE — ED Notes (Signed)
No answer in waiting room 

## 2016-06-04 ENCOUNTER — Inpatient Hospital Stay (HOSPITAL_COMMUNITY)
Admission: EM | Admit: 2016-06-04 | Discharge: 2016-06-07 | DRG: 202 | Disposition: A | Discharge status: Home / Self Care | Payer: Medicaid Other | Attending: Internal Medicine | Admitting: Internal Medicine

## 2016-06-04 ENCOUNTER — Encounter (HOSPITAL_COMMUNITY): Payer: Self-pay | Admitting: General Practice

## 2016-06-04 ENCOUNTER — Observation Stay (HOSPITAL_COMMUNITY): Payer: Medicaid Other

## 2016-06-04 ENCOUNTER — Emergency Department (HOSPITAL_COMMUNITY): Payer: Medicaid Other

## 2016-06-04 DIAGNOSIS — E876 Hypokalemia: Secondary | ICD-10-CM | POA: Diagnosis present

## 2016-06-04 DIAGNOSIS — J9600 Acute respiratory failure, unspecified whether with hypoxia or hypercapnia: Secondary | ICD-10-CM | POA: Diagnosis present

## 2016-06-04 DIAGNOSIS — J45901 Unspecified asthma with (acute) exacerbation: Secondary | ICD-10-CM | POA: Diagnosis present

## 2016-06-04 DIAGNOSIS — J45902 Unspecified asthma with status asthmaticus: Secondary | ICD-10-CM | POA: Diagnosis not present

## 2016-06-04 DIAGNOSIS — Z9114 Patient's other noncompliance with medication regimen: Secondary | ICD-10-CM | POA: Diagnosis not present

## 2016-06-04 DIAGNOSIS — N289 Disorder of kidney and ureter, unspecified: Secondary | ICD-10-CM

## 2016-06-04 DIAGNOSIS — J4551 Severe persistent asthma with (acute) exacerbation: Secondary | ICD-10-CM | POA: Diagnosis present

## 2016-06-04 LAB — BASIC METABOLIC PANEL
Anion gap: 7 (ref 5–15)
BUN: 9 mg/dL (ref 6–20)
CO2: 24 mmol/L (ref 22–32)
Calcium: 9.3 mg/dL (ref 8.9–10.3)
Chloride: 108 mmol/L (ref 101–111)
Creatinine, Ser: 1.33 mg/dL — ABNORMAL HIGH (ref 0.61–1.24)
GFR calc Af Amer: >60 mL/min (ref >60)
GFR calc non Af Amer: >60 mL/min (ref >60)
Glucose, Bld: 104 mg/dL — ABNORMAL HIGH (ref 65–99)
Potassium: 3.3 mmol/L — ABNORMAL LOW (ref 3.5–5.1)
Sodium: 139 mmol/L (ref 135–145)

## 2016-06-04 LAB — CBC WITH DIFFERENTIAL/PLATELET
Basophils Absolute: 0 10*3/uL (ref 0.0–0.1)
Basophils Relative: 0 %
Eosinophils Absolute: 1 10*3/uL — ABNORMAL HIGH (ref 0.0–0.7)
Eosinophils Relative: 13 %
HCT: 43.7 % (ref 39.0–52.0)
Hemoglobin: 14.5 g/dL (ref 13.0–17.0)
Lymphocytes Relative: 33 %
Lymphs Abs: 2.7 10*3/uL (ref 0.7–4.0)
MCH: 27.3 pg (ref 26.0–34.0)
MCHC: 33.2 g/dL (ref 30.0–36.0)
MCV: 82.1 fL (ref 78.0–100.0)
Monocytes Absolute: 0.4 10*3/uL (ref 0.1–1.0)
Monocytes Relative: 5 %
Neutro Abs: 4 10*3/uL (ref 1.7–7.7)
Neutrophils Relative %: 49 %
Platelets: 237 10*3/uL (ref 150–400)
RBC: 5.32 MIL/uL (ref 4.22–5.81)
RDW: 12.6 % (ref 11.5–15.5)
WBC: 8.2 10*3/uL (ref 4.0–10.5)

## 2016-06-04 MED ORDER — ALBUTEROL (5 MG/ML) CONTINUOUS INHALATION SOLN
10.0000 mg/h | INHALATION_SOLUTION | RESPIRATORY_TRACT | Status: DC
  Administered 2016-06-04: 10 mg/h via RESPIRATORY_TRACT
  Filled 2016-06-04: qty 20

## 2016-06-04 MED ORDER — ALBUTEROL (5 MG/ML) CONTINUOUS INHALATION SOLN
15.0000 mg/h | INHALATION_SOLUTION | RESPIRATORY_TRACT | Status: DC
  Administered 2016-06-04: 15 mg/h via RESPIRATORY_TRACT

## 2016-06-04 MED ORDER — PREDNISONE 20 MG PO TABS
50.0000 mg | ORAL_TABLET | Freq: Every day | ORAL | Status: DC
  Administered 2016-06-05: 50 mg via ORAL
  Filled 2016-06-04: qty 3

## 2016-06-04 MED ORDER — PNEUMOCOCCAL VAC POLYVALENT 25 MCG/0.5ML IJ INJ
0.5000 mL | INJECTION | INTRAMUSCULAR | Status: AC
  Administered 2016-06-05: 0.5 mL via INTRAMUSCULAR
  Filled 2016-06-04: qty 0.5

## 2016-06-04 MED ORDER — BUDESONIDE 0.5 MG/2ML IN SUSP
0.2500 mg | Freq: Two times a day (BID) | RESPIRATORY_TRACT | Status: DC
  Administered 2016-06-04 – 2016-06-07 (×7): 0.25 mg via RESPIRATORY_TRACT
  Filled 2016-06-04 (×7): qty 2

## 2016-06-04 MED ORDER — IPRATROPIUM-ALBUTEROL 0.5-2.5 (3) MG/3ML IN SOLN
3.0000 mL | RESPIRATORY_TRACT | Status: DC | PRN
  Filled 2016-06-04: qty 3

## 2016-06-04 MED ORDER — IPRATROPIUM BROMIDE 0.02 % IN SOLN
RESPIRATORY_TRACT | Status: AC
  Filled 2016-06-04: qty 2.5

## 2016-06-04 MED ORDER — PREDNISONE 20 MG PO TABS
60.0000 mg | ORAL_TABLET | Freq: Once | ORAL | Status: AC
  Administered 2016-06-04: 60 mg via ORAL
  Filled 2016-06-04: qty 3

## 2016-06-04 MED ORDER — ACETAMINOPHEN 650 MG RE SUPP: 650.0000 mg | Freq: Four times a day (QID) | RECTAL | Status: DC | PRN

## 2016-06-04 MED ORDER — ACETAMINOPHEN 325 MG PO TABS: 650.0000 mg | ORAL_TABLET | Freq: Four times a day (QID) | ORAL | Status: DC | PRN

## 2016-06-04 MED ORDER — POTASSIUM CHLORIDE CRYS ER 20 MEQ PO TBCR: 20.0000 meq | EXTENDED_RELEASE_TABLET | ORAL | Status: AC

## 2016-06-04 MED ORDER — ALBUTEROL SULFATE (2.5 MG/3ML) 0.083% IN NEBU: 2.5000 mg | INHALATION_SOLUTION | RESPIRATORY_TRACT | Status: AC | PRN

## 2016-06-04 MED ORDER — IPRATROPIUM BROMIDE 0.02 % IN SOLN
0.5000 mg | Freq: Once | RESPIRATORY_TRACT | Status: AC
  Administered 2016-06-04: 0.5 mg via RESPIRATORY_TRACT

## 2016-06-04 MED ORDER — ENOXAPARIN SODIUM 40 MG/0.4ML ~~LOC~~ SOLN
40.0000 mg | SUBCUTANEOUS | Status: DC
  Administered 2016-06-05 – 2016-06-06 (×2): 40 mg via SUBCUTANEOUS
  Filled 2016-06-04 (×3): qty 0.4

## 2016-06-04 MED ORDER — MAGNESIUM SULFATE 2 GM/50ML IV SOLN
2.0000 g | Freq: Once | INTRAVENOUS | Status: AC
  Administered 2016-06-04: 2 g via INTRAVENOUS
  Filled 2016-06-04: qty 50

## 2016-06-04 MED ORDER — SODIUM CHLORIDE 0.9 % IV SOLN
INTRAVENOUS | Status: DC
  Administered 2016-06-05 – 2016-06-07 (×3): via INTRAVENOUS

## 2016-06-04 MED ORDER — IPRATROPIUM-ALBUTEROL 0.5-2.5 (3) MG/3ML IN SOLN
3.0000 mL | Freq: Four times a day (QID) | RESPIRATORY_TRACT | Status: DC
  Administered 2016-06-04 – 2016-06-06 (×8): 3 mL via RESPIRATORY_TRACT
  Filled 2016-06-04 (×8): qty 3

## 2016-06-04 MED ORDER — MONTELUKAST SODIUM 10 MG PO TABS
10.0000 mg | ORAL_TABLET | Freq: Every day | ORAL | Status: DC
  Administered 2016-06-04 – 2016-06-06 (×3): 10 mg via ORAL
  Filled 2016-06-04 (×3): qty 1

## 2016-06-04 NOTE — H&P (Signed)
History and Physical    Milinda CaveRaheim O Marcella ZOX:[id number] DOB: [birth year] DOA: 06/04/2016   PCP: No PCP -Unassigned   Patient coming from/Resides with: Private Residence/lives with mother and siblings  Chief Complaint: Asthma exacerbation  HPI: John Schwartz is a 20 y.o. male with medical history significant for long-standing asthma previously [facility]. According to the electronic medical record patient has previously been prescribed albuterol, Symbicort and Singulair. In addition he has had several emergency room visits this spring/summer season including an admission in early May for an acute asthma exacerbation. Patient reports that he has been unable to afford his long-acting bronchodilator (listed as Symbicort) despite having Medicaid. He reports he has been utilizing albuterol to assist with his breathing and takes Singulair daily as prescribed. He reports that 2 days ago he developed typical asthma exacerbation symptoms with increased wheezing dyspnea and chest tightness. Nonproductive cough. No fevers chills or other constitutional symptoms indicative of an infectious process. He reports on a good day that his peak flow is between 6.5 and 7.0. He denies personal tobacco use or exposure to tobacco use in the home or frequenting public establishments such as night clubs that would have tobacco smoke.  ED Course:  Vital signs: 98.7-142/91-90-19-room air saturations 93% Repeat vital signs: 113/50 9-1 17-17-room air saturations 97% PCXR: No acute process including infiltrate or edema Lab data: Sodium 139, potassium 3.3, BUN 9, creatinine 1.33, glucose 104, WBCs 8200 with normal differential except for mildly elevated absolute eosinophil Medications and treatments: Initially given a Proventil nebulizer 1, this was followed by continuous Proventil 10 mg per hour followed by 60mg  prednisone, Atrovent neb 0.5 mg, and then an  additional Proventil neb 15 mg per hour; he was also given magnesium 2 g IV 1  Review of Systems:  In addition to the HPI above,  No Fever-chills, myalgias or other constitutional symptoms No Headache, changes with Vision or hearing, new weakness, tingling, numbness in any extremity, No problems swallowing food or Liquids, indigestion/reflux No Cough, palpitations, orthopnea  No Abdominal pain, N/V; no melena or hematochezia, no dark tarry stools No dysuria, hematuria or flank pain No new skin rashes, lesions, masses or bruises, No new joints pains-aches No recent weight gain or loss No polyuria, polydypsia or polyphagia,   Past Medical History  Diagnosis Date  . Bronchospasm   . Asthma     History reviewed. No pertinent past surgical history.  Social History   Social History  . Marital Status: Single    Spouse Name: N/A  . Number of Children: N/A  . Years of Education: N/A   Occupational History  . Not on file.   Social History Main Topics  . Smoking status: Never Smoker   . Smokeless tobacco: Not on file  . Alcohol Use: No  . Drug Use: No     Comment: Hx of smoking marajauna, last used over a year ago  . Sexual Activity: Yes   Other Topics Concern  . Not on file   Social History Narrative   ** Merged History Encounter **       Lives with Mom 2 brothers and 1 sister ([phone])    Mobility: Without assistive devices Work history: Unemployed   No Known Allergies  Family History  Problem Relation Age of Onset  . Arthritis Paternal Grandmother      Prior to Admission medications   Medication Sig Start Date End Date Taking? Authorizing Provider  albuterol (PROVENTIL HFA;VENTOLIN HFA) 108 (90 Base) MCG/ACT inhaler Inhale 2 puffs into the lungs every 6 (six) hours as needed for wheezing or shortness of breath. 04/07/16  Yes Charmayne Sheer Beers, PA-C  budesonide-formoterol (SYMBICORT) 160-4.5 MCG/ACT inhaler Inhale 2 puffs into the lungs 2 (two) times daily.    Yes Historical Provider, MD    Physical Exam: Filed Vitals:   06/04/16 0430 06/04/16 0518 06/04/16 0530 06/04/16 0636  BP: 136/79  137/69 133/52  Pulse: 66  77 108  Temp:      TempSrc:      Resp:   16   Height:      Weight:      SpO2: 97% 100% 99% 94%      Constitutional: NAD, calm, comfortable Eyes: PERRL, lids and conjunctivae normal ENMT: Mucous membranes are moist. Posterior pharynx clear of any exudate or lesions.Normal dentition.  Neck: normal, supple, no masses, no thyromegaly Respiratory: Did air movement bilaterally with scattered wheezing with predominant wheezing on the right side no crackles. Normal respiratory effort. No accessory muscle use. Room air Cardiovascular: Regular rate and occasionally tachycardic after nebs but with regular rhythm, no murmurs / rubs / gallops. No extremity edema. 2+ pedal pulses. No carotid bruits.  Abdomen: no tenderness, no masses palpated. No hepatosplenomegaly. Bowel sounds positive.  Musculoskeletal: no clubbing / cyanosis. No joint deformity upper and lower extremities. Good ROM, no contractures. Normal muscle tone.  Skin: no rashes, lesions, ulcers. No induration Neurologic: CN 2-12 grossly intact. Sensation intact, DTR normal. Strength 5/5 x all 4 extremities.  Psychiatric: Normal judgment and insight. Alert and oriented x 3. Normal mood.    Labs on Admission: I have personally reviewed following labs and imaging studies  CBC:  Recent Labs Lab 06/04/16 0503  WBC 8.2  NEUTROABS 4.0  HGB 14.5  HCT 43.7  MCV 82.1  PLT 237   Basic Metabolic Panel:  Recent Labs Lab 06/04/16 0503  NA 139  K 3.3*  CL 108  CO2 24  GLUCOSE 104*  BUN 9  CREATININE 1.33*  CALCIUM 9.3   GFR: Estimated Creatinine Clearance: 91.5 mL/min (by C-G formula based on Cr of 1.33). Liver Function Tests: No results for input(s): AST, ALT, ALKPHOS, BILITOT, PROT, ALBUMIN in the last 168 hours. No results for input(s): LIPASE, AMYLASE in the  last 168 hours. No results for input(s): AMMONIA in the last 168 hours. Coagulation Profile: No results for input(s): INR, PROTIME in the last 168 hours. Cardiac Enzymes: No results for input(s): CKTOTAL, CKMB, CKMBINDEX, TROPONINI in the last 168 hours. BNP (last 3 results) No results for input(s): PROBNP in the last 8760 hours. HbA1C: No results for input(s): HGBA1C in the last 72 hours. CBG: No results for input(s): GLUCAP in the last 168 hours. Lipid Profile: No results for input(s): CHOL, HDL, LDLCALC, TRIG, CHOLHDL, LDLDIRECT in the last 72 hours. Thyroid Function Tests: No results for input(s): TSH, T4TOTAL, FREET4, T3FREE, THYROIDAB in the last 72 hours. Anemia Panel: No results for input(s): VITAMINB12, FOLATE, FERRITIN, TIBC, IRON, RETICCTPCT in the last 72 hours. Urine analysis: No results found for: COLORURINE, APPEARANCEUR, LABSPEC, PHURINE, GLUCOSEU, HGBUR, BILIRUBINUR, KETONESUR, PROTEINUR, UROBILINOGEN, NITRITE, LEUKOCYTESUR Sepsis Labs: @LABRCNTIP (procalcitonin:4,lacticidven:4) )No results found for this or any previous visit (from the past 240 hour(s)).   Radiological Exams on Admission: Dg Chest Portable 1 View  06/04/2016  CLINICAL DATA:  Dyspnea.  Asthma EXAM: PORTABLE CHEST 1 VIEW COMPARISON:  08/25/2014 FINDINGS: Normal heart size and mediastinal contours. No acute infiltrate  or edema. No effusion or pneumothorax. No acute osseous findings. IMPRESSION: Negative portable chest. Electronically Signed   By: Marnee Spring M.D.   On: 06/04/2016 06:47     Assessment/Plan Principal Problem:    Acute respiratory failure 2/2  Asthma with status asthmaticus -Patient presents with classic and typical symptoms for acute asthma exacerbation with initial presentation consistent with status asthmaticus -Currently moving air well with continued wheezing primarily on the right side -In addition to nebulizer treatments patient has been ordered prednisone 60 mg 1 in the  ER -Budesonide neb every 12 hours -DuoNeb scheduled every 6 hours and PRN every 2 hours -No indication for antibiotics -Check peak flow pre-and post bronchodilator neb treatments -Continue preadmission Singulair  Active Problems:   Acute hypokalemia -Oral potassium has been ordered by EDP to be given -Repeat electrolytes in a.m.    Acute renal insufficiency -Baseline creatinine appears to be between 1 and 1.08 with current creatinine 1.33 -Normal saline IV fluids to help with insensible pulmonary losses during acute asthma exacerbation    Noncompliance with medication treatment due to underuse of medication -Patient reports compliance with peak flow measurements at home as well as with utilization of short acting albuterol and Singulair; does not have an "asthma plan" -Reports unable to afford Symbicort despite having Medicaid so need to clarify which LABA is covered by Medicaid therefore have consulted case management -Patient does not have PCP and has Medicaid so patient needs to be reminded prior to discharge the process for looking up eligible for physicians-it appears previously he had been seen by pediatric pulmonologists and probably has never located an adult physician since that time      DVT prophylaxis: Lovenox  Code Status: Full code  Family Communication: Family member at bedside was asleep during my initial evaluation of the patient  Disposition Plan: Anticipate discharge back to preadmission home environment once medically stable-patient needs medication assistance regarding appropriate LABA covered by Medicaid and also needs assistance with locating a Medicaid approved physician Consults called: None  Admission status: Observation/medical floor     Levonte Molina L. ANP-BC [facility] Pager [phone]   If 7PM-7AM, please contact night-coverage [email or web address] Password [facility]  06/04/2016, 7:36 AM

## 2016-06-04 NOTE — Progress Notes (Addendum)
Received signout from Dr. Juleen ChinaKohut Mr. Wonda OldsCousin is a 20 year old male with pmh of asthma; who presents with complaints of cough, wheezing, and chest tightness. Patient was previously is as per exacerbation requiring intubation. No relief with home inhalers. O2 saturations noted to be maintained on room air on admission. Patient was given 2 hour long nebulized treatments, 2 g of magnesium sulfate, and 60 mg prednisone po. However, still reported to be wheezing significantly on physical exam. Lab work showed normal CBC. Potassium 3.3, BUN 9, and creatinine at 1.33. No chest x-ray was obtained initially. Requested that a chest x-ray be obtained. Admit as observation to a MedSurg bed.

## 2016-06-04 NOTE — Care Management Note (Signed)
Case Management Note  Patient Details  Name: John Schwartz MRN: [id number] Date of Birth: 04/17/1996  Subjective/Objective:                 Independent patient from home, in obs for asthma exacerbation. Patient has been intubated in past, most recently 2 years ago. Patient takes albuterol and symbacort inhalers at home. Covered by medicaid. Does not have PCP listed, goes to Dr Meredeth IdeFleming for asthma.    Action/Plan:  CM will continue to follow  Expected Discharge Date:                  Expected Discharge Plan:  Home/Self Care  In-House Referral:     Discharge planning Services  CM Consult  Post Acute Care Choice:  NA Choice offered to:  NA  DME Arranged:  N/A DME Agency:  NA  HH Arranged:  NA HH Agency:     Status of Service:  Completed, signed off  If discussed at Long Length of Stay Meetings, dates discussed:    Additional Comments:  Lawerance SabalDebbie Khristina Janota, RN 06/04/2016, 3:50 PM

## 2016-06-04 NOTE — ED Notes (Signed)
Attempted report 

## 2016-06-04 NOTE — Progress Notes (Addendum)
Discussed briefly with case management/Sarah Manson PasseyBrown, RN-Symbicort and Advair are both covered by Medicaid as a preferred medication. I suspect patient does not understand that his LABA is different from his albuterol i.e. one is for prevention in one to assist with acute flares. I did discuss this with him briefly at time of admission BUT the patient was quite sleepy and likely needs educational reinforcement regarding the purpose of his medications prior to discharge. Another concern is that when he turns 21 and become ineligible for dependent Medicaid. CM researched this and was informed that the pt should continue on Medicaid as he is doing and once 21 they will send him a letter regarding upcoming change in coverage and how to proceed.   Junious SilkAllison Pailynn Vahey, ANP

## 2016-06-04 NOTE — ED Provider Notes (Signed)
CSN: [id number]     Arrival date & time 06/03/16  2327 History  By signing my name below, I, Soijett Blue, attest that this documentation has been prepared under the direction and in the presence of Raeford Razor, MD. Electronically Signed: Soijett Blue, ED Scribe. 06/04/2016. 3:47 AM.   Chief Complaint  Patient presents with  . Asthma      The history is provided by the patient. No language interpreter was used.    John Schwartz is a 20 y.o. male with a PMHx of asthma and bronchospasm, who presents to the Emergency Department complaining of exacerbation of asthma onset 2 days. Pt notes that he has had to be intubated for his symptoms with the last time being 2 years ago. Pt states that he has had to be admitted for his symptoms in the past. Pt reports that he is followed by Dr. Meredeth Ide for his symptoms. Pt notes that he will intermittently take prednisone to alleviate his symptoms. He states that he is having associated symptoms of chest tightness, CP due to cough, and wheezing. He states that he has tried albuterol rescue inhaler and neb treatment with no relief for his symptoms. He denies fever, leg pain, leg swelling, and any other symptoms.   Past Medical History  Diagnosis Date  . Bronchospasm   . Asthma    History reviewed. No pertinent past surgical history. Family History  Problem Relation Age of Onset  . Arthritis Paternal Grandmother    Social History  Substance Use Topics  . Smoking status: Never Smoker   . Smokeless tobacco: None  . Alcohol Use: No    Review of Systems  A complete 10 system review of systems was obtained and all systems are negative except as noted in the HPI and PMH.   Allergies  Review of patient's allergies indicates no known allergies.  Home Medications   Prior to Admission medications   Medication Sig Start Date End Date Taking? Authorizing Provider  albuterol (PROVENTIL HFA;VENTOLIN HFA) 108 (90 Base) MCG/ACT inhaler Inhale 2 puffs  into the lungs every 6 (six) hours as needed for wheezing or shortness of breath. 04/07/16   Charmayne Sheer Beers, PA-C  azithromycin (ZITHROMAX Z-PAK) 250 MG tablet Take 2 tablets (500 mg) on  Day 1,  followed by 1 tablet (250 mg) once daily on Days 2 through 5. 04/07/16   Evangeline Dakin, PA-C  Fluticasone-Salmeterol (ADVAIR DISKUS IN) Inhale 1 puff into the lungs 2 (two) times daily.    Historical Provider, MD  guaiFENesin-codeine 100-10 MG/5ML syrup Take 10 mLs by mouth every 4 (four) hours as needed for cough. 04/07/16   Evangeline Dakin, PA-C  predniSONE (DELTASONE) 10 MG tablet Take 5 tablets (50 mg total) by mouth daily with breakfast. 04/07/16   Charmayne Sheer Beers, PA-C   BP 123/80 mmHg  Pulse 62  Temp(Src) 98.7 F (37.1 C) (Oral)  Resp 20  Ht  (1.778 m)  Wt 161 lb 6 oz (73.199 kg)  BMI 23.15 kg/m2  SpO2 96% Physical Exam  Constitutional: He is oriented to person, place, and time. He appears well-developed and well-nourished. No distress.  HENT:  Head: Normocephalic and atraumatic.  Eyes: EOM are normal.  Neck: Neck supple.  Cardiovascular: Normal rate, regular rhythm and normal heart sounds.  Exam reveals no gallop and no friction rub.   No murmur heard. Pulmonary/Chest: Effort normal. Tachypnea noted. No respiratory distress. He has wheezes. He has no rales.  Expiratory wheezes bilaterally.  Prolonged expiratory phase. Tachypnea.   Abdominal: He exhibits no distension.  Musculoskeletal: Normal range of motion.  Neurological: He is alert and oriented to person, place, and time.  Skin: Skin is warm and dry.  Psychiatric: He has a normal mood and affect. His behavior is normal.  Nursing note and vitals reviewed.   ED Course  Procedures (including critical care time)  CRITICAL CARE Performed by: Raeford RazorKOHUT, Cindel Daugherty Total critical care time: 35 minutes Critical care time was exclusive of separately billable procedures and treating other patients. Critical care was necessary to treat  or prevent imminent or life-threatening deterioration. Critical care was time spent personally by me on the following activities: development of treatment plan with patient and/or surrogate as well as nursing, discussions with consultants, evaluation of patient's response to treatment, examination of patient, obtaining history from patient or surrogate, ordering and performing treatments and interventions, ordering and review of laboratory studies, ordering and review of radiographic studies, pulse oximetry and re-evaluation of patient's condition.  DIAGNOSTIC STUDIES: Oxygen Saturation is 97% on RA, nl by my interpretation.    COORDINATION OF CARE: 3:42 AM Discussed treatment plan with pt at bedside which includes breathing treatment and prednisone and pt agreed to plan.    MDM   Final diagnoses:  Status asthmaticus, unspecified asthma severity    20yM with asthma exacerbation. Steroids and hour long neb. WOB and subjective not better. Has previously required admission and also previously been intubated. He is not hypoxic, but is not appropriate for discharge at this time though. Will admit for ongoing treatment.   I personally preformed the services scribed in my presence. The recorded information has been reviewed is accurate. Raeford RazorStephen Aanvi Voyles, MD.    Raeford RazorStephen Shonte Beutler, MD 06/18/16 2224

## 2016-06-05 DIAGNOSIS — Z91120 Patient's intentional underdosing of medication regimen due to financial hardship: Secondary | ICD-10-CM | POA: Diagnosis not present

## 2016-06-05 DIAGNOSIS — N289 Disorder of kidney and ureter, unspecified: Secondary | ICD-10-CM | POA: Diagnosis present

## 2016-06-05 DIAGNOSIS — T486X6A Underdosing of antiasthmatics, initial encounter: Secondary | ICD-10-CM | POA: Diagnosis present

## 2016-06-05 DIAGNOSIS — R0602 Shortness of breath: Secondary | ICD-10-CM | POA: Diagnosis present

## 2016-06-05 DIAGNOSIS — E876 Hypokalemia: Secondary | ICD-10-CM | POA: Diagnosis not present

## 2016-06-05 DIAGNOSIS — Z23 Encounter for immunization: Secondary | ICD-10-CM | POA: Diagnosis not present

## 2016-06-05 DIAGNOSIS — J45902 Unspecified asthma with status asthmaticus: Principal | ICD-10-CM

## 2016-06-05 DIAGNOSIS — Z9114 Patient's other noncompliance with medication regimen: Secondary | ICD-10-CM | POA: Diagnosis not present

## 2016-06-05 DIAGNOSIS — J9600 Acute respiratory failure, unspecified whether with hypoxia or hypercapnia: Secondary | ICD-10-CM | POA: Diagnosis present

## 2016-06-05 LAB — BASIC METABOLIC PANEL
ANION GAP: 4 — AB (ref 5–15)
BUN: 11 mg/dL (ref 6–20)
CHLORIDE: 112 mmol/L — AB (ref 101–111)
CO2: 23 mmol/L (ref 22–32)
Calcium: 8.9 mg/dL (ref 8.9–10.3)
Creatinine, Ser: 1.09 mg/dL (ref 0.61–1.24)
GFR calc Af Amer: >60 mL/min (ref >60)
GFR calc non Af Amer: >60 mL/min (ref >60)
GLUCOSE: 97 mg/dL (ref 65–99)
POTASSIUM: 3.8 mmol/L (ref 3.5–5.1)
Sodium: 139 mmol/L (ref 135–145)

## 2016-06-05 MED ORDER — METHYLPREDNISOLONE SODIUM SUCC 125 MG IJ SOLR
60.0000 mg | Freq: Three times a day (TID) | INTRAMUSCULAR | Status: DC
  Administered 2016-06-05 – 2016-06-06 (×3): 60 mg via INTRAVENOUS
  Filled 2016-06-05 (×3): qty 2

## 2016-06-05 NOTE — Progress Notes (Signed)
PROGRESS NOTE    CASHTYN POULIOT  ZOX:[id number] DOB: 05-27-1996 DOA: 06/04/2016 PCP: No PCP Per Patient   Brief Narrative: 20 yo M with history of asthma followed by [facility], recurrent hospitalization and prior VDRF, coming with shortness of breath, wheezing, in the setting of not affording his home medications. No fevers, no purulent cough.  Assessment & Plan:   Principal Problem:   Asthma with status asthmaticus Active Problems:   Acute respiratory failure (HCC)   Acute hypokalemia   Noncompliance with medication treatment due to underuse of medication   Acute renal insufficiency   Acute asthma exacerbation  Acute respiratory failure 2/2 Asthma with status asthmaticus -Patient presents with classic and typical symptoms for acute asthma exacerbation with initial presentation consistent with status asthmaticus -Budesonide neb every 12 hours -DuoNeb scheduled every 6 hours and PRN every 2 hours -No indication for antibiotics -Continue preadmission Singulair -Still with significant bilateral wheezing. Will start IV solumedrol.    Acute hypokalemia -resolved.    Acute renal insufficiency Cr on admission 1.3.  Improved with IV fluids. Today at 1.09    DVT prophylaxis: lovenox Code Status: Full code.  Family Communication: care discussed with patient  Disposition Plan: home in 24 hours if Respiratory status improved.    Consultants:   none   Procedures:  none  Antimicrobials:   none   Subjective: Breathing better, not at baseline yet   Objective: Filed Vitals:   06/05/16 0232 06/05/16 0532 06/05/16 0812 06/05/16 0825  BP:  114/61    Pulse:  85    Temp:  98.2 F (36.8 C)    TempSrc:      Resp:  18    Height:      Weight:      SpO2: 96% 96% 98% 94%    Intake/Output Summary (Last 24 hours) at 06/05/16 1011 Last data filed at 06/05/16 0651  Gross per 24 hour  Intake 1747.5 ml  Output    300 ml  Net 1447.5 ml   Filed Weights   06/03/16 2338 06/04/16 0852  Weight: 73.199 kg (161 lb 6 oz) 73.6 kg (162 lb 4.1 oz)    Examination:  General exam: Appears calm and comfortable  Respiratory system: Bilateral expiratory wheezing  Cardiovascular system: S1 & S2 heard, RRR. No JVD, murmurs, rubs, gallops or clicks. No pedal edema. Gastrointestinal system: Abdomen is nondistended, soft and nontender. No organomegaly or masses felt. Normal bowel sounds heard. Central nervous system: Alert and oriented. No focal neurological deficits. Extremities: Symmetric 5 x 5 power. Skin: No rashes, lesions or ulcers Psychiatry: Judgement and insight appear normal. Mood & affect appropriate.     Data Reviewed: I have personally reviewed following labs and imaging studies  CBC:  Recent Labs Lab 06/04/16 0503  WBC 8.2  NEUTROABS 4.0  HGB 14.5  HCT 43.7  MCV 82.1  PLT 237   Basic Metabolic Panel:  Recent Labs Lab 06/04/16 0503 06/05/16 0602  NA 139 139  K 3.3* 3.8  CL 108 112*  CO2 24 23  GLUCOSE 104* 97  BUN 9 11  CREATININE 1.33* 1.09  CALCIUM 9.3 8.9   GFR: Estimated Creatinine Clearance: 111.6 mL/min (by C-G formula based on Cr of 1.09). Liver Function Tests: No results for input(s): AST, ALT, ALKPHOS, BILITOT, PROT, ALBUMIN in the last 168 hours. No results for input(s): LIPASE, AMYLASE in the last 168 hours. No results for input(s): AMMONIA in the last 168 hours. Coagulation Profile: No results for input(s):  INR, PROTIME in the last 168 hours. Cardiac Enzymes: No results for input(s): CKTOTAL, CKMB, CKMBINDEX, TROPONINI in the last 168 hours. BNP (last 3 results) No results for input(s): PROBNP in the last 8760 hours. HbA1C: No results for input(s): HGBA1C in the last 72 hours. CBG: No results for input(s): GLUCAP in the last 168 hours. Lipid Profile: No results for input(s): CHOL, HDL, LDLCALC, TRIG, CHOLHDL, LDLDIRECT in the last 72 hours. Thyroid Function Tests: No results for input(s): TSH,  T4TOTAL, FREET4, T3FREE, THYROIDAB in the last 72 hours. Anemia Panel: No results for input(s): VITAMINB12, FOLATE, FERRITIN, TIBC, IRON, RETICCTPCT in the last 72 hours. Sepsis Labs: No results for input(s): PROCALCITON, LATICACIDVEN in the last 168 hours.  No results found for this or any previous visit (from the past 240 hour(s)).       Radiology Studies: Dg Chest Portable 1 View  06/04/2016  CLINICAL DATA:  Dyspnea.  Asthma EXAM: PORTABLE CHEST 1 VIEW COMPARISON:  08/25/2014 FINDINGS: Normal heart size and mediastinal contours. No acute infiltrate or edema. No effusion or pneumothorax. No acute osseous findings. IMPRESSION: Negative portable chest. Electronically Signed   By: Marnee SpringJonathon  Watts M.D.   On: 06/04/2016 06:47        Scheduled Meds: . budesonide (PULMICORT) nebulizer solution  0.25 mg Nebulization BID  . enoxaparin (LOVENOX) injection  40 mg Subcutaneous Q24H  . ipratropium-albuterol  3 mL Nebulization Q6H  . methylPREDNISolone (SOLU-MEDROL) injection  60 mg Intravenous Q8H  . montelukast  10 mg Oral QHS  . pneumococcal 23 valent vaccine  0.5 mL Intramuscular Tomorrow-1000   Continuous Infusions: . sodium chloride 75 mL/hr at 06/04/16 0742  . albuterol Stopped (06/04/16 [id number])        Time spent: 35 minutes,.     Alba Coryegalado, Belkys A, MD [facility] Pager [phone]  If 7PM-7AM, please contact night-coverage [email or web address] Password [email or web address] 06/05/2016, 10:11 AM

## 2016-06-06 DIAGNOSIS — E876 Hypokalemia: Secondary | ICD-10-CM

## 2016-06-06 LAB — HIV ANTIBODY (ROUTINE TESTING W REFLEX): HIV SCREEN 4TH GENERATION: NONREACTIVE

## 2016-06-06 MED ORDER — METHYLPREDNISOLONE SODIUM SUCC 125 MG IJ SOLR
60.0000 mg | Freq: Four times a day (QID) | INTRAMUSCULAR | Status: DC
  Administered 2016-06-06 – 2016-06-07 (×4): 60 mg via INTRAVENOUS
  Filled 2016-06-06 (×5): qty 2

## 2016-06-06 MED ORDER — IPRATROPIUM-ALBUTEROL 0.5-2.5 (3) MG/3ML IN SOLN
3.0000 mL | Freq: Three times a day (TID) | RESPIRATORY_TRACT | Status: DC
  Administered 2016-06-06: 3 mL via RESPIRATORY_TRACT
  Filled 2016-06-06: qty 3

## 2016-06-06 MED ORDER — IPRATROPIUM-ALBUTEROL 0.5-2.5 (3) MG/3ML IN SOLN
3.0000 mL | Freq: Four times a day (QID) | RESPIRATORY_TRACT | Status: DC
  Administered 2016-06-06 – 2016-06-07 (×5): 3 mL via RESPIRATORY_TRACT
  Filled 2016-06-06 (×5): qty 3

## 2016-06-06 NOTE — Progress Notes (Signed)
PROGRESS NOTE    John Schwartz  JYN:[id number] DOB: 11/27/1995 DOA: 06/04/2016 PCP: No PCP Per Patient   Brief Narrative: 20 yo M with history of asthma followed by [facility] Pulmonology, recurrent hospitalization and prior VDRF, coming with shortness of breath, wheezing, in the setting of not affording his home medications. No fevers, no purulent cough.  Assessment & Plan:   Principal Problem:   Asthma with status asthmaticus Active Problems:   Acute respiratory failure (HCC)   Asthma exacerbation   Acute hypokalemia   Noncompliance with medication treatment due to underuse of medication   Acute renal insufficiency   Acute asthma exacerbation  Acute respiratory failure 2/2 Asthma with status asthmaticus -Patient presents with classic and typical symptoms for acute asthma exacerbation with initial presentation consistent with status asthmaticus -Budesonide neb every 12 hours -DuoNeb scheduled every 6 hours and PRN every 2 hours -No indication for antibiotics -Continue preadmission Singulair -Still with significant bilateral wheezing. Increase  IV solumedrol.    Acute hypokalemia -resolved.    Acute renal insufficiency Cr on admission 1.3.  Improved with IV fluids. Today at 1.09  HIV negative    DVT prophylaxis: lovenox Code Status: Full code.  Family Communication: care discussed with patient  Disposition Plan: home in 24 hours if Respiratory status improved.    Consultants:   none   Procedures:  none  Antimicrobials:   none   Subjective: Breathing better, not at baseline yet  Feels better than on admission/   Objective: Filed Vitals:   06/05/16 2120 06/06/16 0235 06/06/16 0549 06/06/16 0742  BP: 129/74  105/36   Pulse: 99  71 70  Temp: 98.5 F (36.9 C)  97.9 F (36.6 C)   TempSrc: Oral  Oral   Resp: Height:      Weight:      SpO2: 96% 97% 96% 92%    Intake/Output Summary (Last 24 hours) at 06/06/16 1334 Last data filed at  06/06/16 0852  Gross per 24 hour  Intake 2061.25 ml  Output   2025 ml  Net  36.25 ml   Filed Weights   06/03/16 2338 06/04/16 0852  Weight: 73.199 kg (161 lb 6 oz) 73.6 kg (162 lb 4.1 oz)    Examination:  General exam: Appears calm and comfortable  Respiratory system: Bilateral expiratory wheezing  Cardiovascular system: S1 & S2 heard, RRR. No JVD, murmurs, rubs, gallops or clicks. No pedal edema. Gastrointestinal system: Abdomen is nondistended, soft and nontender. No organomegaly or masses felt. Normal bowel sounds heard. Central nervous system: Alert and oriented. No focal neurological deficits. Extremities: Symmetric 5 x 5 power. Skin: No rashes, lesions or ulcers Psychiatry: Judgement and insight appear normal. Mood & affect appropriate.     Data Reviewed: I have personally reviewed following labs and imaging studies  CBC:  Recent Labs Lab 06/04/16 0503  WBC 8.2  NEUTROABS 4.0  HGB 14.5  HCT 43.7  MCV 82.1  PLT 237   Basic Metabolic Panel:  Recent Labs Lab 06/04/16 0503 06/05/16 0602  NA 139 139  K 3.3* 3.8  CL 108 112*  CO2 24 23  GLUCOSE 104* 97  BUN 9 11  CREATININE 1.33* 1.09  CALCIUM 9.3 8.9   GFR: Estimated Creatinine Clearance: 111.6 mL/min (by C-G formula based on Cr of 1.09). Liver Function Tests: No results for input(s): AST, ALT, ALKPHOS, BILITOT, PROT, ALBUMIN in the last 168 hours. No results for input(s): LIPASE, AMYLASE in the last 168  hours. No results for input(s): AMMONIA in the last 168 hours. Coagulation Profile: No results for input(s): INR, PROTIME in the last 168 hours. Cardiac Enzymes: No results for input(s): CKTOTAL, CKMB, CKMBINDEX, TROPONINI in the last 168 hours. BNP (last 3 results) No results for input(s): PROBNP in the last 8760 hours. HbA1C: No results for input(s): HGBA1C in the last 72 hours. CBG: No results for input(s): GLUCAP in the last 168 hours. Lipid Profile: No results for input(s): CHOL, HDL,  LDLCALC, TRIG, CHOLHDL, LDLDIRECT in the last 72 hours. Thyroid Function Tests: No results for input(s): TSH, T4TOTAL, FREET4, T3FREE, THYROIDAB in the last 72 hours. Anemia Panel: No results for input(s): VITAMINB12, FOLATE, FERRITIN, TIBC, IRON, RETICCTPCT in the last 72 hours. Sepsis Labs: No results for input(s): PROCALCITON, LATICACIDVEN in the last 168 hours.  No results found for this or any previous visit (from the past 240 hour(s)).       Radiology Studies: No results found.      Scheduled Meds: . budesonide (PULMICORT) nebulizer solution  0.25 mg Nebulization BID  . enoxaparin (LOVENOX) injection  40 mg Subcutaneous Q24H  . ipratropium-albuterol  3 mL Nebulization Q6H  . methylPREDNISolone (SOLU-MEDROL) injection  60 mg Intravenous Q6H  . montelukast  10 mg Oral QHS   Continuous Infusions: . sodium chloride 75 mL/hr at 06/06/16 0807     LOS: 1 day    Time spent: 35 minutes,.     Alba Cory, MD [facility] Pager [phone]  If 7PM-7AM, please contact night-coverage [email or web address] Password [email or web address] 06/06/2016, 1:34 PM

## 2016-06-07 MED ORDER — GUAIFENESIN 100 MG/5ML PO SYRP: 200.0000 mg | ORAL_SOLUTION | ORAL | Status: DC | PRN

## 2016-06-07 MED ORDER — BUDESONIDE 0.25 MG/2ML IN SUSP: 0.2500 mg | Freq: Two times a day (BID) | RESPIRATORY_TRACT | Status: DC

## 2016-06-07 MED ORDER — PREDNISONE 20 MG PO TABS: ORAL_TABLET | ORAL | 0 refills | Status: DC

## 2016-06-07 MED ORDER — MONTELUKAST SODIUM 10 MG PO TABS: 10.0000 mg | ORAL_TABLET | Freq: Every day | ORAL | 0 refills | Status: DC

## 2016-06-07 NOTE — Progress Notes (Signed)
Nsg Discharge Note  Admit Date:  06/04/2016 Discharge date: 06/07/2016   Nyan O Cudney to be D/C'd Home per MD order.  AVS completed.  Copy for chart, and copy for patient signed, and dated. Patient/caregiver able to verbalize understanding.  Discharge Medication:   Medication List    TAKE these medications   albuterol 108 (90 Base) MCG/ACT inhaler Commonly known as:  PROVENTIL HFA;VENTOLIN HFA Inhale 2 puffs into the lungs every 6 (six) hours as needed for wheezing or shortness of breath.   budesonide-formoterol 160-4.5 MCG/ACT inhaler Commonly known as:  SYMBICORT Inhale 2 puffs into the lungs 2 (two) times daily.   montelukast 10 MG tablet Commonly known as:  SINGULAIR Take 1 tablet (10 mg total) by mouth at bedtime.   predniSONE 20 MG tablet Commonly known as:  DELTASONE Take 2 tablets for 6 days       Discharge Assessment: Vitals:   06/06/16 2111 06/07/16 0522  BP: (!) 122/42 (!) 114/51  Pulse: 93 62  Resp: 16 16  Temp: 98.2 F (36.8 C) 98 F (36.7 C)   Skin clean, dry and intact without evidence of skin break down, no evidence of skin tears noted. IV catheter discontinued intact. Site without signs and symptoms of complications - no redness or edema noted at insertion site, patient denies c/o pain - only slight tenderness at site.  Dressing with slight pressure applied.  D/c Instructions-Education: Discharge instructions given to patient/family with verbalized understanding. D/c education completed with patient/family including follow up instructions, medication list, d/c activities limitations if indicated, with other d/c instructions as indicated by MD - patient able to verbalize understanding, all questions fully answered. Patient instructed to return to ED, call 911, or call MD for any changes in condition.  Patient escorted via WC, and D/C home via private auto.  Kevina Piloto Consuella Lose, RN 06/07/2016 3:37 PM

## 2016-06-07 NOTE — Discharge Summary (Signed)
Physician Discharge Summary  John Schwartz:[id number] DOB: 1996/04/02 DOA: 06/04/2016  PCP: No PCP Per Patient  Admit date: 06/04/2016 Discharge date: 06/07/2016  Admitted From:  Home  Disposition: Home   Recommendations for Outpatient Follow-up:  1. Follow up with PCP in 1-2 weeks 2. Please obtain BMP/CBC in one week    Discharge Condition: stable.  CODE STATUS: full code.  Diet recommendation: Heart Healthy   Brief/Interim Summary: Brief Narrative: 20 yo M with history of asthma followed by [facility] Pulmonology, recurrent hospitalization and prior VDRF, coming with shortness of breath, wheezing, in the setting of not affording his home medications. No fevers, no purulent cough.  Assessment & Plan: Acute respiratory failure 2/2 Asthma with status asthmaticus -Patient presents with classic and typical symptoms for acute asthma exacerbation with initial presentation consistent with status asthmaticus -Budesonide neb every 12 hours -DuoNeb scheduled every 6 hours and PRN every 2 hours -No indication for antibiotics -Continue preadmission Singulair No wheezing on lung exam today. He is feeling better. Discharge on prednisone, resume Symbicort. Education regarding medications provide to patient.    Acute hypokalemia -resolved.    Acute renal insufficiency Cr on admission 1.3.  Improved with IV fluids. Resolved   HIV negative    Discharge Diagnoses:    Asthma with status asthmaticus   Acute respiratory failure (HCC)   Asthma exacerbation   Acute hypokalemia   Noncompliance with medication treatment due to underuse of medication   Acute renal insufficiency   Acute asthma exacerbation    Discharge Instructions  Discharge Instructions    Diet - low sodium heart healthy    Complete by:  As directed   Increase activity slowly    Complete by:  As directed       Medication List    TAKE these medications   albuterol 108 (90 Base) MCG/ACT inhaler Commonly known  as:  PROVENTIL HFA;VENTOLIN HFA Inhale 2 puffs into the lungs every 6 (six) hours as needed for wheezing or shortness of breath.   budesonide-formoterol 160-4.5 MCG/ACT inhaler Commonly known as:  SYMBICORT Inhale 2 puffs into the lungs 2 (two) times daily.   montelukast 10 MG tablet Commonly known as:  SINGULAIR Take 1 tablet (10 mg total) by mouth at bedtime.   predniSONE 20 MG tablet Commonly known as:  DELTASONE Take 2 tablets for 6 days       No Known Allergies  Consultations:  none   Procedures/Studies: Dg Chest Portable 1 View  Result Date: 06/04/2016 CLINICAL DATA:  Dyspnea.  Asthma EXAM: PORTABLE CHEST 1 VIEW COMPARISON:  08/25/2014 FINDINGS: Normal heart size and mediastinal contours. No acute infiltrate or edema. No effusion or pneumothorax. No acute osseous findings. IMPRESSION: Negative portable chest. Electronically Signed   By: Marnee Spring M.D.   On: 06/04/2016 06:47      Subjective: He is feeling better, breathing better . At baseline   Discharge Exam: Vitals:   06/06/16 2111 06/07/16 0522  BP: (!) 122/42 (!) 114/51  Pulse: 93 62  Resp: 16 16  Temp: 98.2 F (36.8 C) 98 F (36.7 C)   Vitals:   06/06/16 2111 06/07/16 0145 06/07/16 0522 06/07/16 0904  BP: (!) 122/42  (!) 114/51   Pulse: 93  62   Resp: 16  16   Temp: 98.2 F (36.8 C)  98 F (36.7 C)   TempSrc: Oral  Oral   SpO2: 96% 95% 96% 98%  Weight:      Height:  General: Pt is alert, awake, not in acute distress Cardiovascular: RRR, S1/S2 +, no rubs, no gallops Respiratory: CTA bilaterally, no wheezing, no rhonchi Abdominal: Soft, NT, ND, bowel sounds + Extremities: no edema, no cyanosis    The results of significant diagnostics from this hospitalization (including imaging, microbiology, ancillary and laboratory) are listed below for reference.     Microbiology: No results found for this or any previous visit (from the past 240 hour(s)).   Labs: BNP (last 3  results) No results for input(s): BNP in the last 8760 hours. Basic Metabolic Panel:  Recent Labs Lab 06/04/16 0503 06/05/16 0602  NA 139 139  K 3.3* 3.8  CL 108 112*  CO2 24 23  GLUCOSE 104* 97  BUN 9 11  CREATININE 1.33* 1.09  CALCIUM 9.3 8.9   Liver Function Tests: No results for input(s): AST, ALT, ALKPHOS, BILITOT, PROT, ALBUMIN in the last 168 hours. No results for input(s): LIPASE, AMYLASE in the last 168 hours. No results for input(s): AMMONIA in the last 168 hours. CBC:  Recent Labs Lab 06/04/16 0503  WBC 8.2  NEUTROABS 4.0  HGB 14.5  HCT 43.7  MCV 82.1  PLT 237   Cardiac Enzymes: No results for input(s): CKTOTAL, CKMB, CKMBINDEX, TROPONINI in the last 168 hours. BNP: Invalid input(s): POCBNP CBG: No results for input(s): GLUCAP in the last 168 hours. D-Dimer No results for input(s): DDIMER in the last 72 hours. Hgb A1c No results for input(s): HGBA1C in the last 72 hours. Lipid Profile No results for input(s): CHOL, HDL, LDLCALC, TRIG, CHOLHDL, LDLDIRECT in the last 72 hours. Thyroid function studies No results for input(s): TSH, T4TOTAL, T3FREE, THYROIDAB in the last 72 hours.  Invalid input(s): FREET3 Anemia work up No results for input(s): VITAMINB12, FOLATE, FERRITIN, TIBC, IRON, RETICCTPCT in the last 72 hours. Urinalysis No results found for: COLORURINE, APPEARANCEUR, LABSPEC, PHURINE, GLUCOSEU, HGBUR, BILIRUBINUR, KETONESUR, PROTEINUR, UROBILINOGEN, NITRITE, LEUKOCYTESUR Sepsis Labs Invalid input(s): PROCALCITONIN,  WBC,  LACTICIDVEN Microbiology No results found for this or any previous visit (from the past 240 hour(s)).   Time coordinating discharge: Over 30 minutes  SIGNED:   Alba Cory, MD  [facility] 06/07/2016, 9:24 AM Pager [phone]  If 7PM-7AM, please contact night-coverage [email or web address] Password [email or web address]

## 2016-11-01 ENCOUNTER — Encounter: Payer: Self-pay | Admitting: Emergency Medicine

## 2016-11-01 ENCOUNTER — Emergency Department: Payer: Medicaid Other

## 2016-11-01 ENCOUNTER — Observation Stay
Admission: EM | Admit: 2016-11-01 | Discharge: 2016-11-02 | Disposition: A | Discharge status: Home / Self Care | Payer: Medicaid Other | Attending: Internal Medicine | Admitting: Internal Medicine

## 2016-11-01 DIAGNOSIS — J45901 Unspecified asthma with (acute) exacerbation: Secondary | ICD-10-CM | POA: Diagnosis present

## 2016-11-01 DIAGNOSIS — J4541 Moderate persistent asthma with (acute) exacerbation: Principal | ICD-10-CM | POA: Insufficient documentation

## 2016-11-01 DIAGNOSIS — J4551 Severe persistent asthma with (acute) exacerbation: Secondary | ICD-10-CM | POA: Diagnosis present

## 2016-11-01 DIAGNOSIS — J45909 Unspecified asthma, uncomplicated: Secondary | ICD-10-CM | POA: Diagnosis present

## 2016-11-01 LAB — CREATININE, SERUM: CREATININE: 1.19 mg/dL (ref 0.61–1.24)

## 2016-11-01 LAB — MAGNESIUM: MAGNESIUM: 1.7 mg/dL (ref 1.7–2.4)

## 2016-11-01 MED ORDER — SODIUM CHLORIDE 0.9 % IV SOLN: 250.0000 mL | INTRAVENOUS | Status: DC | PRN

## 2016-11-01 MED ORDER — MONTELUKAST SODIUM 10 MG PO TABS
10.0000 mg | ORAL_TABLET | Freq: Every day | ORAL | Status: DC
  Administered 2016-11-01: 10 mg via ORAL
  Filled 2016-11-01: qty 1

## 2016-11-01 MED ORDER — INFLUENZA VAC SPLIT QUAD 0.5 ML IM SUSY
0.5000 mL | PREFILLED_SYRINGE | INTRAMUSCULAR | Status: DC
  Filled 2016-11-01: qty 0.5

## 2016-11-01 MED ORDER — ALBUTEROL SULFATE (2.5 MG/3ML) 0.083% IN NEBU: 2.5000 mg | INHALATION_SOLUTION | RESPIRATORY_TRACT | Status: DC | PRN

## 2016-11-01 MED ORDER — GUAIFENESIN 100 MG/5ML PO SOLN
200.0000 mg | ORAL | Status: DC | PRN
  Administered 2016-11-01: 200 mg via ORAL
  Filled 2016-11-01 (×3): qty 10

## 2016-11-01 MED ORDER — PREDNISONE 20 MG PO TABS
60.0000 mg | ORAL_TABLET | Freq: Once | ORAL | Status: AC
  Administered 2016-11-01: 60 mg via ORAL
  Filled 2016-11-01: qty 3

## 2016-11-01 MED ORDER — IPRATROPIUM-ALBUTEROL 0.5-2.5 (3) MG/3ML IN SOLN
3.0000 mL | Freq: Once | RESPIRATORY_TRACT | Status: AC
  Administered 2016-11-01: 3 mL via RESPIRATORY_TRACT
  Filled 2016-11-01: qty 3

## 2016-11-01 MED ORDER — ALBUTEROL (5 MG/ML) CONTINUOUS INHALATION SOLN: 10.0000 mg/h | INHALATION_SOLUTION | Freq: Once | RESPIRATORY_TRACT | Status: DC

## 2016-11-01 MED ORDER — ALBUTEROL SULFATE (2.5 MG/3ML) 0.083% IN NEBU
2.5000 mg | INHALATION_SOLUTION | RESPIRATORY_TRACT | Status: AC
  Administered 2016-11-01 (×4): 2.5 mg via RESPIRATORY_TRACT
  Filled 2016-11-01 (×4): qty 3

## 2016-11-01 MED ORDER — ONDANSETRON HCL 4 MG PO TABS: 4.0000 mg | ORAL_TABLET | Freq: Four times a day (QID) | ORAL | Status: DC | PRN

## 2016-11-01 MED ORDER — ALBUTEROL SULFATE (2.5 MG/3ML) 0.083% IN NEBU: 5.0000 mg | INHALATION_SOLUTION | Freq: Once | RESPIRATORY_TRACT | Status: DC

## 2016-11-01 MED ORDER — SODIUM CHLORIDE 0.9 % IV BOLUS (SEPSIS)
500.0000 mL | Freq: Once | INTRAVENOUS | Status: AC
  Administered 2016-11-01: 500 mL via INTRAVENOUS

## 2016-11-01 MED ORDER — ENOXAPARIN SODIUM 40 MG/0.4ML ~~LOC~~ SOLN
40.0000 mg | SUBCUTANEOUS | Status: DC
  Administered 2016-11-01: 40 mg via SUBCUTANEOUS
  Filled 2016-11-01: qty 0.4

## 2016-11-01 MED ORDER — METHYLPREDNISOLONE SODIUM SUCC 125 MG IJ SOLR
60.0000 mg | Freq: Two times a day (BID) | INTRAMUSCULAR | Status: DC
  Administered 2016-11-01 – 2016-11-02 (×2): 60 mg via INTRAVENOUS
  Filled 2016-11-01 (×2): qty 2

## 2016-11-01 MED ORDER — IPRATROPIUM-ALBUTEROL 0.5-2.5 (3) MG/3ML IN SOLN
3.0000 mL | Freq: Four times a day (QID) | RESPIRATORY_TRACT | Status: DC
  Administered 2016-11-01 – 2016-11-02 (×3): 3 mL via RESPIRATORY_TRACT
  Filled 2016-11-01 (×3): qty 3

## 2016-11-01 MED ORDER — MOMETASONE FURO-FORMOTEROL FUM 200-5 MCG/ACT IN AERO
2.0000 | INHALATION_SPRAY | Freq: Two times a day (BID) | RESPIRATORY_TRACT | Status: DC
  Administered 2016-11-01 – 2016-11-02 (×2): 2 via RESPIRATORY_TRACT
  Filled 2016-11-01: qty 8.8

## 2016-11-01 MED ORDER — ONDANSETRON HCL 4 MG/2ML IJ SOLN: 4.0000 mg | Freq: Four times a day (QID) | INTRAMUSCULAR | Status: DC | PRN

## 2016-11-01 MED ORDER — SODIUM CHLORIDE 0.9% FLUSH
3.0000 mL | Freq: Two times a day (BID) | INTRAVENOUS | Status: DC
  Administered 2016-11-01 – 2016-11-02 (×3): 3 mL via INTRAVENOUS

## 2016-11-01 MED ORDER — SODIUM CHLORIDE 0.9% FLUSH: 3.0000 mL | INTRAVENOUS | Status: DC | PRN

## 2016-11-01 MED ORDER — ACETAMINOPHEN 650 MG RE SUPP: 650.0000 mg | Freq: Four times a day (QID) | RECTAL | Status: DC | PRN

## 2016-11-01 MED ORDER — ACETAMINOPHEN 325 MG PO TABS: 650.0000 mg | ORAL_TABLET | Freq: Four times a day (QID) | ORAL | Status: DC | PRN

## 2016-11-01 NOTE — H&P (Signed)
[facility] [place] [facility]   PATIENT NAME: John Schwartz    MR#:  [id number]  DATE OF BIRTH:  January 03, 1996  DATE OF ADMISSION:  11/01/2016  PRIMARY CARE PHYSICIAN: No PCP Per Patient   REQUESTING/REFERRING PHYSICIAN: Dr. Huel CoteQuigley  CHIEF COMPLAINT:   Chief Complaint  Patient presents with  . Asthma   Cough, shortness of breath and wheezing for 3 days. HISTORY OF PRESENT ILLNESS:  John Schwartz  is a 20 y.o. male with a known history of Asthma. He presently ED with the above chief complaints. He has had cough, wheezing and shortness of breath for 3 days. His chest x-ray is clear. He used home nebulizer without improvement. He is treate IV steroids and nebulizer ED, but still has wheezing. ED physician request observation.  PAST MEDICAL HISTORY:   Past Medical History:  Diagnosis Date  . Asthma   . Bronchospasm     PAST SURGICAL HISTORY:   Past Surgical History:  Procedure Laterality Date  . NO PAST SURGERIES      SOCIAL HISTORY:   Social History  Substance Use Topics  . Smoking status: Never Smoker  . Smokeless tobacco: Never Used  . Alcohol use No    FAMILY HISTORY:   Family History  Problem Relation Age of Onset  . Arthritis Paternal Grandmother     DRUG ALLERGIES:  No Known Allergies  REVIEW OF SYSTEMS:   Review of Systems  Constitutional: Negative for chills, fever and malaise/fatigue.  HENT: Negative for congestion.   Eyes: Negative for blurred vision and double vision.  Respiratory: Positive for cough, shortness of breath and wheezing. Negative for hemoptysis, sputum production and stridor.   Cardiovascular: Negative for chest pain, palpitations and leg swelling.  Gastrointestinal: Negative for abdominal pain, diarrhea, nausea and vomiting.  Genitourinary: Negative for dysuria and hematuria.  Musculoskeletal: Negative for joint pain.  Neurological: Negative for dizziness, focal weakness and loss of consciousness.    Psychiatric/Behavioral: Negative for depression. The patient is not nervous/anxious.     MEDICATIONS AT HOME:   Prior to Admission medications   Medication Sig Start Date End Date Taking? Authorizing Provider  albuterol (PROVENTIL HFA;VENTOLIN HFA) 108 (90 Base) MCG/ACT inhaler Inhale 2 puffs into the lungs every 6 (six) hours as needed for wheezing or shortness of breath. 04/07/16   Evangeline Dakinharles M Beers, PA-C  budesonide-formoterol (SYMBICORT) 160-4.5 MCG/ACT inhaler Inhale 2 puffs into the lungs 2 (two) times daily.    Historical Provider, MD  montelukast (SINGULAIR) 10 MG tablet Take 1 tablet (10 mg total) by mouth at bedtime. 06/07/16   Belkys A Regalado, MD  predniSONE (DELTASONE) 20 MG tablet Take 2 tablets for 6 days 06/07/16   Belkys A Regalado, MD      VITAL SIGNS:  Blood pressure 138/87, pulse 77, temperature 98.9 F (37.2 C), temperature source Oral, resp. rate 20, height 5\' 10"  (1.778 m), weight 163 lb (73.9 kg), SpO2 97 %.  PHYSICAL EXAMINATION:  Physical Exam  GENERAL:  20 y.o.-year-old patient lying in the bed with no acute distress.  EYES: Pupils equal, round, reactive to light and accommodation. No scleral icterus. Extraocular muscles intact.  HEENT: Head atraumatic, normocephalic. Oropharynx and nasopharynx clear.  NECK:  Supple, no jugular venous distention. No thyroid enlargement, no tenderness.  LUNGS: Moderate bilateral expiratory wheezing, no rales,rhonchi or crepitation. No use of accessory muscles of respiration.  CARDIOVASCULAR: S1, S2 normal. No murmurs, rubs, or gallops.  ABDOMEN: Soft, nontender, nondistended. Bowel sounds present. No  organomegaly or mass.  EXTREMITIES: No pedal edema, cyanosis, or clubbing.  NEUROLOGIC: Cranial nerves II through XII are intact. Muscle strength 5/5 in all extremities. Sensation intact. Gait not checked.  PSYCHIATRIC: The patient is alert and oriented x 3.  SKIN: No obvious rash, lesion, or ulcer.   LABORATORY PANEL:   CBC No  results for input(s): WBC, HGB, HCT, PLT in the last 168 hours. ------------------------------------------------------------------------------------------------------------------  Chemistries  No results for input(s): NA, K, CL, CO2, GLUCOSE, BUN, CREATININE, CALCIUM, MG, AST, ALT, ALKPHOS, BILITOT in the last 168 hours.  Invalid input(s): GFRCGP ------------------------------------------------------------------------------------------------------------------  Cardiac Enzymes No results for input(s): TROPONINI in the last 168 hours. ------------------------------------------------------------------------------------------------------------------  RADIOLOGY:  Dg Chest 2 View  Result Date: 11/01/2016 CLINICAL DATA:  Shortness of breath. EXAM: CHEST  2 VIEW COMPARISON:  Apr 07, 2016 FINDINGS: The heart size and mediastinal contours are within normal limits. Both lungs are clear. The visualized skeletal structures are unremarkable. IMPRESSION: No active cardiopulmonary disease. Electronically Signed   By: Gerome Samavid  Williams III M.D   On: 11/01/2016 12:01      IMPRESSION AND PLAN:   Asthma exacerbation.  Patient will be placed for admission. Continue IV Solu-Medrol, DuoNeb every 6 hours, continue Symbicort and Singulair.   All the records are reviewed and case discussed with ED provider. Management plans discussed with the patient, family and they are in agreement.  CODE STATUS: Full code.  TOTAL TIME TAKING CARE OF THIS PATIENT: 42 minutes.    Shaune Pollackhen, Brannon Decaire M.D on 11/01/2016 at 1:54 PM  Between 7am to 6pm - Pager - [phone]  After 6pm go to [email or web address] - Scientist, research (life [email or web address])password [email or web address]  [facility] [place] [facility]  Office  [phone]  CC: Primary care physician; No PCP Per Patient   Note: This dictation was prepared with Dragon dictation along with smaller phrase technology. Any transcriptional errors that result from this process are unintentional.

## 2016-11-01 NOTE — ED Triage Notes (Signed)
Pt c/o SHOB since Friday. Has asthma. Has been using home nebulizer without relief. Taken 2 nebs today. No distress at this time but is moderately diminished with wheezing on ausculation. Denies fevers. Reports has been admitted in past when presents like this.

## 2016-11-01 NOTE — ED Provider Notes (Signed)
Time Seen: Approximately 1142  I have reviewed the triage notes  Chief Complaint: Asthma   History of Present Illness: John Schwartz is a 20 y.o. male who presents with a long history of asthma. Patient has had a history of admissions and previous intubation in the past. He states this particular asthmatic episode is been occurring over the last several days. He's been using his home nebulizer and states he does have plenty and nebulizer treatments at home without success for relief. He is currently not on any steroids and states he does tolerate steroids well. He's had a productive cough on occasion with yellow to green tinged sputum especially green over the last 24 hours. Fever or hemoptysis. He denies any history of pulmonary embolism.   Past Medical History:  Diagnosis Date  . Asthma   . Bronchospasm     Patient Active Problem List   Diagnosis Date Noted  . Asthma with status asthmaticus 06/04/2016  . Acute hypokalemia 06/04/2016  . Noncompliance with medication treatment due to underuse of medication 06/04/2016  . Acute renal insufficiency 06/04/2016  . Acute asthma exacerbation 06/04/2016  . Dyspnea 03/16/2016  . Asthma exacerbation 03/16/2016  . Status asthmaticus 02/11/2014  . Acute respiratory failure (HCC) 02/11/2014    Past Surgical History:  Procedure Laterality Date  . NO PAST SURGERIES      Past Surgical History:  Procedure Laterality Date  . NO PAST SURGERIES      Current Outpatient Rx  . Order #: [id number] Class: Print  . Order #: [id number] Class: Historical Med  . Order #: [id number] Class: Historical Med  . Order #: [id number] Class: Print    Allergies:  Patient has no known allergies.  Family History: Family History  Problem Relation Age of Onset  . Arthritis Paternal Grandmother     Social History: Social History  Substance Use Topics  . Smoking status: Never Smoker  . Smokeless tobacco: Never Used  . Alcohol use No     Review of  Systems:   10 point review of systems was performed and was otherwise negative:  Constitutional: No fever Eyes: No visual disturbances ENT: No sore throat, ear pain Cardiac: No chest pain Respiratory: Shortness of breath with asthma since Friday. 2 nebulizers prior to arrival Abdomen: No abdominal pain, no vomiting, No diarrhea Endocrine: No weight loss, No night sweats Extremities: No peripheral edema, cyanosis Skin: No rashes, easy bruising Neurologic: No focal weakness, trouble with speech or swollowing Urologic: No dysuria, Hematuria, or urinary frequency   Physical Exam:  ED Triage Vitals [11/01/16 1133]  Enc Vitals Group     BP (!) 141/87     Pulse Rate 80     Resp 20     Temp 98.9 F (37.2 C)     Temp Source Oral     SpO2 97 %     Weight 163 lb (73.9 kg)     Height 5\' 10"  (1.778 m)     Head Circumference      Peak Flow      Pain Score 7     Pain Loc      Pain Edu?      Excl. in GC?     General: Awake , Alert , and Oriented times 3; GCS 15 No signs of respiratory distress and speaks in full and complete sentences Head: Normal cephalic , atraumatic Eyes: Pupils equal , round, reactive to light Nose/Throat: No nasal drainage, patent upper airway without erythema or exudate.  Neck: Supple,  Full range of motion, No anterior adenopathy or palpable thyroid masses Lungs: Bilateral and expiratory wheezing heard primarily at the bases without rales or rhonchi Heart: Regular rate, regular rhythm without murmurs , gallops , or rubs Abdomen: Soft, non tender without rebound, guarding , or rigidity; bowel sounds positive and symmetric in all 4 quadrants. No organomegaly .        Extremities: 2 plus symmetric pulses. No edema, clubbing or cyanosis Neurologic: normal ambulation, Motor symmetric without deficits, sensory intact Skin: warm, dry, no rashes   Radiology:  "Dg Chest 2 View  Result Date: 11/01/2016 CLINICAL DATA:  Shortness of breath. EXAM: CHEST  2 VIEW  COMPARISON:  Apr 07, 2016 FINDINGS: The heart size and mediastinal contours are within normal limits. Both lungs are clear. The visualized skeletal structures are unremarkable. IMPRESSION: No active cardiopulmonary disease. Electronically Signed   By: Gerome Samavid  Williams III M.D   On: 11/01/2016 12:01  " I personally reviewed the radiologic studies   ED Course: Patient had no episodes of hypoxia. He was given a 1 hour continuous treatment and repeat exam shows little change in his wheezing. Patient was given additional DuoNeb along with oral prednisone and had an IV established with a 500 mL fluid bolus. The patient states that he's had similar asthma exacerbations in the past and usually requires admission and taken feels symptomatically improved. He does not appear to be in respiratory distress though again still has persistent wheezing after an extensive treatments here prior to arrival and also in the emergency department Clinical Course      Assessment:  Acute exacerbation of chronic asthma   Final Clinical Impression:   Final diagnoses:  Moderate persistent asthma with exacerbation     Plan:  Inpatient            Jennye MoccasinBrian S Ginamarie Banfield, MD 11/01/16 1420

## 2016-11-01 NOTE — ED Notes (Signed)
ED Provider at bedside. 

## 2016-11-02 MED ORDER — PREDNISONE 10 MG PO TABS: ORAL_TABLET | ORAL | 0 refills | Status: DC

## 2016-11-02 MED ORDER — GUAIFENESIN-CODEINE 100-10 MG/5ML PO SOLN: 10.0000 mL | ORAL | 0 refills | Status: DC | PRN

## 2016-11-02 NOTE — Discharge Instructions (Signed)

## 2016-11-02 NOTE — Progress Notes (Signed)
Patient A&O, VSS.  No complaints of pain.  Tolerating diet well.  Ambulating in room.  Discharge instructions reviewed with patient.  Understanding was verbalized and all questions were answered.  Prescription given.  MD contacted regarding prescription needing resent to [facility]; instructed patient that pharmacy can also transfer rx if needed.  IV removed without difficulty.  Discharged home in stable condition ambulatory.

## 2016-11-02 NOTE — Discharge Summary (Addendum)
[facility]   PATIENT NAME: John [facility]    MR#:  [id number]  DATE OF BIRTH:  [birth year]  DATE OF ADMISSION:  11/01/2016 ADMITTING PHYSICIAN: Shaune Pollack, MD  DATE OF DISCHARGE: 11/02/16  PRIMARY CARE PHYSICIAN: No PCP Per Patient    ADMISSION DIAGNOSIS:  Moderate persistent asthma with exacerbation [J45.41]  DISCHARGE DIAGNOSIS:  Active Problems:   Asthma exacerbation   SECONDARY DIAGNOSIS:   Past Medical History:  Diagnosis Date  . Asthma   . Bronchospasm     HOSPITAL COURSE:  John Schwartz  is a 20 y.o. male admitted 11/01/2016 with chief complaint Asthma . Please see H&P performed by Shaune Pollack, MD for further information. Patient presented with shortness of breath secondary to asthma exacerbation. He has improved with breathing treatments and steroids.   DISCHARGE CONDITIONS:   stable  CONSULTS OBTAINED:    DRUG ALLERGIES:  No Known Allergies  DISCHARGE MEDICATIONS:   Current Discharge Medication List    START taking these medications   Details  guaiFENesin-codeine 100-10 MG/5ML syrup Take 10 mLs by mouth every 4 (four) hours as needed for cough. Qty: 120 mL, Refills: 0    predniSONE (DELTASONE) 10 MG tablet 40mg  x1 day, 20mg x2 day, 10mg  x2 day then stop Qty: 10 tablet, Refills: 0      CONTINUE these medications which have NOT CHANGED   Details  albuterol (PROVENTIL HFA;VENTOLIN HFA) 108 (90 Base) MCG/ACT inhaler Inhale 2 puffs into the lungs every 6 (six) hours as needed for wheezing or shortness of breath. Qty: 1 Inhaler, Refills: 2    albuterol (PROVENTIL) (2.5 MG/3ML) 0.083% nebulizer solution Take 2.5 mg by nebulization 2 (two) times daily.    budesonide-formoterol (SYMBICORT) 160-4.5 MCG/ACT inhaler Inhale 2 puffs into the lungs 2 (two) times daily.      STOP taking these medications     montelukast (SINGULAIR) 10 MG tablet          DISCHARGE INSTRUCTIONS:    DIET:  Regular  diet  DISCHARGE CONDITION:  Stable  ACTIVITY:  Activity as tolerated  OXYGEN:  Home Oxygen: No.   Oxygen Delivery: room air  DISCHARGE LOCATION:  home   If you experience worsening of your admission symptoms, develop shortness of breath, life threatening emergency, suicidal or homicidal thoughts you must seek medical attention immediately by calling 911 or calling your MD immediately  if symptoms less severe.  You Must read complete instructions/literature along with all the possible adverse reactions/side effects for all the Medicines you take and that have been prescribed to you. Take any new Medicines after you have completely understood and accpet all the possible adverse reactions/side effects.   Please note  You were cared for by a hospitalist during your hospital stay. If you have any questions about your discharge medications or the care you received while you were in the hospital after you are discharged, you can call the unit and asked to speak with the hospitalist on call if the hospitalist that took care of you is not available. Once you are discharged, your primary care physician will handle any further medical issues. Please note that NO REFILLS for any discharge medications will be authorized once you are discharged, as it is imperative that you return to your primary care physician (or establish a relationship with a primary care physician if you do not have one) for your aftercare needs so that they can reassess your need for medications and monitor your lab  values.    On the day of Discharge:   VITAL SIGNS:  Blood pressure (!) 117/50, pulse 96, temperature 98.2 F (36.8 C), temperature source Oral, resp. rate 16, height 5\' 10"  (1.778 m), weight 73.9 kg (163 lb), SpO2 96 %.  I/O:   Intake/Output Summary (Last 24 hours) at 11/02/16 1138 Last data filed at 11/02/16 0900  Gross per 24 hour  Intake              480 ml  Output             2250 ml  Net             -1770 ml    PHYSICAL EXAMINATION:  GENERAL:  [age] y.o.-year-old patient lying in the bed with no acute distress.  EYES: Pupils equal, round, reactive to light and accommodation. No scleral icterus. Extraocular muscles intact.  HEENT: Head atraumatic, normocephalic. Oropharynx and nasopharynx clear.  NECK:  Supple, no jugular venous distention. No thyroid enlargement, no tenderness.  LUNGS: Normal breath sounds bilaterally, no wheezing, rales,rhonchi or crepitation. No use of accessory muscles of respiration.  CARDIOVASCULAR: S1, S2 normal. No murmurs, rubs, or gallops.  ABDOMEN: Soft, non-tender, non-distended. Bowel sounds present. No organomegaly or mass.  EXTREMITIES: No pedal edema, cyanosis, or clubbing.  NEUROLOGIC: Cranial nerves II through XII are intact. Muscle strength 5/5 in all extremities. Sensation intact. Gait not checked.  PSYCHIATRIC: The patient is alert and oriented x 3.  SKIN: No obvious rash, lesion, or ulcer.   DATA REVIEW:   CBC No results for input(s): WBC, HGB, HCT, PLT in the last 168 hours.  Chemistries   Recent Labs Lab 11/01/16 1529  CREATININE 1.19  MG 1.7    Cardiac Enzymes No results for input(s): TROPONINI in the last 168 hours.  Microbiology Results  Results for orders placed or performed during the hospital encounter of 02/11/14  Rapid strep screen     Status: None   Collection Time: 02/11/14  2:20 PM  Result Value Ref Range Status   Streptococcus, Group A Screen (Direct) NEGATIVE NEGATIVE Final    Comment: (NOTE) A Rapid Antigen test may result negative if the antigen level in the sample is below the detection level of this test. The FDA has not cleared this test as a stand-alone test therefore the rapid antigen negative result has reflexed to a Group A Strep culture.  Culture, Group A Strep     Status: None   Collection Time: 02/11/14  2:20 PM  Result Value Ref Range Status   Specimen Description THROAT  Final   Special Requests NONE   Final   Culture   Final    No Beta Hemolytic Streptococci Isolated Performed at [facility]   Report Status 02/13/2014 FINAL  Final    RADIOLOGY:  Dg Chest 2 View  Result Date: 11/01/2016 CLINICAL DATA:  Shortness of breath. EXAM: CHEST  2 VIEW COMPARISON:  Apr 07, 2016 FINDINGS: The heart size and mediastinal contours are within normal limits. Both lungs are clear. The visualized skeletal structures are unremarkable. IMPRESSION: No active cardiopulmonary disease. Electronically Signed   By: Gerome Samavid  Williams III M.D   On: 11/01/2016 12:01     Management plans discussed with the patient, family and they are in agreement.  CODE STATUS:     Code Status Orders        Start     Ordered   11/01/16 1502  Full code  Continuous  11/01/16 1501    Code Status History    Date Active Date Inactive Code Status Order ID Comments User Context   06/04/2016  8:58 AM 06/07/2016  8:06 PM Full Code [id number]  Russella DarAllison L Ellis, NP Inpatient   03/16/2016  3:30 AM 03/17/2016  2:26 PM Full Code [id number]  Ihor AustinPavan Pyreddy, MD Inpatient   02/11/2014  7:07 PM 02/14/2014  9:10 PM Full Code [id number]  Nash Shearerherrelle Smith, MD ED      TOTAL TIME TAKING CARE OF THIS PATIENT: 33 minutes.    Hower,  Mardi MainlandDavid K M.D on 11/02/2016 at 11:38 AM  Between 7am to 6pm - Pager - [phone]  After 6pm go to [email or web address] - Scientist, research (life sciences)password [email or web address]  [facility]  Office  [phone]  CC: Primary care physician; No PCP Per Patient

## 2016-12-28 ENCOUNTER — Emergency Department
Admission: EM | Admit: 2016-12-28 | Discharge: 2016-12-28 | Disposition: A | Discharge status: Home / Self Care | Payer: Medicaid Other | Attending: Emergency Medicine | Admitting: Emergency Medicine

## 2016-12-28 ENCOUNTER — Encounter: Payer: Self-pay | Admitting: *Deleted

## 2016-12-28 DIAGNOSIS — R05 Cough: Secondary | ICD-10-CM | POA: Diagnosis present

## 2016-12-28 DIAGNOSIS — J4541 Moderate persistent asthma with (acute) exacerbation: Secondary | ICD-10-CM | POA: Insufficient documentation

## 2016-12-28 MED ORDER — ALBUTEROL SULFATE (2.5 MG/3ML) 0.083% IN NEBU
2.5000 mg | INHALATION_SOLUTION | Freq: Once | RESPIRATORY_TRACT | Status: AC
  Administered 2016-12-28: 2.5 mg via RESPIRATORY_TRACT
  Filled 2016-12-28: qty 3

## 2016-12-28 MED ORDER — ALBUTEROL SULFATE HFA 108 (90 BASE) MCG/ACT IN AERS: 2.0000 | INHALATION_SPRAY | RESPIRATORY_TRACT | 1 refills | Status: DC | PRN

## 2016-12-28 MED ORDER — IPRATROPIUM BROMIDE 0.02 % IN SOLN: 0.5000 mg | Freq: Four times a day (QID) | RESPIRATORY_TRACT | 0 refills | Status: DC | PRN

## 2016-12-28 MED ORDER — PREDNISONE 20 MG PO TABS
60.0000 mg | ORAL_TABLET | Freq: Once | ORAL | Status: AC
  Administered 2016-12-28: 60 mg via ORAL
  Filled 2016-12-28: qty 3

## 2016-12-28 MED ORDER — IPRATROPIUM-ALBUTEROL 0.5-2.5 (3) MG/3ML IN SOLN
3.0000 mL | Freq: Once | RESPIRATORY_TRACT | Status: AC
  Administered 2016-12-28: 3 mL via RESPIRATORY_TRACT
  Filled 2016-12-28: qty 3

## 2016-12-28 MED ORDER — PREDNISONE 10 MG PO TABS: 50.0000 mg | ORAL_TABLET | Freq: Every day | ORAL | 0 refills | Status: DC

## 2016-12-28 MED ORDER — ALBUTEROL SULFATE (2.5 MG/3ML) 0.083% IN NEBU: 2.5000 mg | INHALATION_SOLUTION | Freq: Four times a day (QID) | RESPIRATORY_TRACT | 12 refills | Status: DC | PRN

## 2016-12-28 NOTE — Discharge Instructions (Signed)
Please follow up with the pulmonologist-- you may have to have a referral from primary care. If so, please schedule an appointment with  your primary care provider.  Take the steroid medication until finished.  Use your inhaler and nebulizer treatments as prescribed.  Return to the ER for symptoms that change or worsen.

## 2016-12-28 NOTE — ED Notes (Signed)
See triage note  States he developed some increased SOB and cough about 2 days ago  No fever states he has used his inhaler and SVN treatments at home with min relief  Able to speak full sentences on arrival  afebrile

## 2016-12-28 NOTE — ED Triage Notes (Signed)
Pt presents to ED with reports of SOB for the past two days. Pt reports having used neb and inhaler at home without success. PT has hx of asthma and reports having had a cough for the past two days that has increased the SOB. Pt able to talk in complete sentences in triage.

## 2016-12-28 NOTE — ED Provider Notes (Signed)
[facility] Emergency Department Provider Note  ____________________________________________  Time seen: Approximately 1:47 PM  I have reviewed the triage vital signs and the nursing notes.   HISTORY  Chief Complaint Cough and Asthma   HPI John Schwartz is a 21 y.o. male who presents to the emergency department for evaluation of asthma flare that has not been relieved by home nebulizer treatments. Patient denies fever or other URI symptoms.    Past Medical History:  Diagnosis Date  . Asthma   . Bronchospasm     Patient Active Problem List   Diagnosis Date Noted  . Asthma with status asthmaticus 06/04/2016  . Acute hypokalemia 06/04/2016  . Noncompliance with medication treatment due to underuse of medication 06/04/2016  . Acute renal insufficiency 06/04/2016  . Acute asthma exacerbation 06/04/2016  . Dyspnea 03/16/2016  . Asthma exacerbation 03/16/2016  . Status asthmaticus 02/11/2014  . Acute respiratory failure (HCC) 02/11/2014    Past Surgical History:  Procedure Laterality Date  . NO PAST SURGERIES      Prior to Admission medications   Medication Sig Start Date End Date Taking? Authorizing Provider  albuterol (PROVENTIL HFA;VENTOLIN HFA) 108 (90 Base) MCG/ACT inhaler Inhale 2 puffs into the lungs every 4 (four) hours as needed for wheezing or shortness of breath. 12/28/16   Chinita Pester, FNP  albuterol (PROVENTIL) (2.5 MG/3ML) 0.083% nebulizer solution Take 3 mLs (2.5 mg total) by nebulization every 6 (six) hours as needed for wheezing or shortness of breath. 12/28/16   Chinita Pester, FNP  ipratropium (ATROVENT) 0.02 % nebulizer solution Take 2.5 mLs (0.5 mg total) by nebulization every 6 (six) hours as needed for wheezing or shortness of breath. 12/28/16   Chinita Pester, FNP  predniSONE (DELTASONE) 10 MG tablet Take 5 tablets (50 mg total) by mouth daily. 12/28/16   Chinita Pester, FNP    Allergies Patient has no known  allergies.  Family History  Problem Relation Age of Onset  . Arthritis Paternal Grandmother     Social History Social History  Substance Use Topics  . Smoking status: Never Smoker  . Smokeless tobacco: Never Used  . Alcohol use No    Review of Systems Constitutional: Negative for fever/chills ENT: Negative for sore throat. Cardiovascular: Denies chest pain. Respiratory: Positive for shortness of breath. Positive for cough. Gastrointestinal: Negative for nausea,  negative for vomiting.  No diarrhea.  Musculoskeletal: Negative for body aches Skin: Negative for rash. Neurological: Negative for headaches ____________________________________________   PHYSICAL EXAM:  VITAL SIGNS: ED Triage Vitals [12/28/16 1306]  Enc Vitals Group     BP (!) 146/73     Pulse Rate 79     Resp 20     Temp 98.7 F (37.1 C)     Temp Source Oral     SpO2 97 %     Weight 162 lb (73.5 kg)     Height 5\' 10"  (1.778 m)     Head Circumference      Peak Flow      Pain Score 8     Pain Loc      Pain Edu?      Excl. in GC?     Constitutional: Alert and oriented. Well appearing and in no acute distress. Eyes: Conjunctivae are normal. EOMI. Nose: No congestion; no rhinnorhea. Mouth/Throat: Mucous membranes are moist.  Oropharynx normal. Tonsils appear without exudate. Neck: No stridor.  Lymphatic: No cervical lymphadenopathy. Cardiovascular: Normal rate, regular rhythm. Grossly normal  heart sounds.  Good peripheral circulation. Respiratory: Normal respiratory effort.  No retractions. Audible wheezing. Gastrointestinal: Soft and nontender.  Musculoskeletal: FROM x 4 extremities.  Neurologic:  Normal speech and language.  Skin:  Skin is warm, dry and intact. No rash noted. Psychiatric: Mood and affect are normal. Speech and behavior are normal.  ____________________________________________   LABS (all labs ordered are listed, but only abnormal results are displayed)  Labs Reviewed - No  data to display ____________________________________________  EKG   ____________________________________________  RADIOLOGY  Not indicated ____________________________________________   PROCEDURES  Procedure(s) performed: None  Critical Care performed: No  ____________________________________________   INITIAL IMPRESSION / ASSESSMENT AND PLAN / ED COURSE     Pertinent labs & imaging results that were available during my care of the patient were reviewed by me and considered in my medical decision making (see chart for details).   21 year old male presents for treatment of asthma flare. While in the ER , he was given  A DuoNeb treatment without much improvement. He was also given prednisone.  After about an hour, his ambulatory saturation was 97% and he denied feeling of dyspnea. He was given an albuterol neb treatment and reassessed about an hour later. Wheezing had completely resolved and the patient reported feeling much better. He was discharged home with refills of his albuterol inhaler as well as his nebulizer solution of atrovent and albuterol. He was strongly advised to follow up with pulmonology/Primary Care for discussion of asthma plan. He was given strict ER return precautions. ____________________________________________   FINAL CLINICAL IMPRESSION(S) / ED DIAGNOSES  Final diagnoses:  Moderate persistent asthma with exacerbation    Note:  This document was prepared using Dragon voice recognition software and may include unintentional dictation errors.     Chinita PesterCari B [name], FNP 12/28/16 1708    Rockne MenghiniAnne-Caroline [facility], MD 01/05/17 [phone]

## 2017-01-01 ENCOUNTER — Emergency Department: Payer: Medicaid Other

## 2017-01-01 ENCOUNTER — Emergency Department (HOSPITAL_COMMUNITY)
Admission: EM | Admit: 2017-01-01 | Discharge: 2017-01-01 | Disposition: A | Discharge status: Home / Self Care | Payer: Medicaid Other | Attending: Emergency Medicine | Admitting: Emergency Medicine

## 2017-01-01 ENCOUNTER — Emergency Department
Admission: EM | Admit: 2017-01-01 | Discharge: 2017-01-01 | Disposition: A | Discharge status: Home / Self Care | Payer: Medicaid Other | Attending: Emergency Medicine | Admitting: Emergency Medicine

## 2017-01-01 ENCOUNTER — Encounter: Payer: Self-pay | Admitting: Emergency Medicine

## 2017-01-01 ENCOUNTER — Encounter (HOSPITAL_COMMUNITY): Payer: Self-pay | Admitting: *Deleted

## 2017-01-01 DIAGNOSIS — Z79899 Other long term (current) drug therapy: Secondary | ICD-10-CM | POA: Diagnosis not present

## 2017-01-01 DIAGNOSIS — J4520 Mild intermittent asthma, uncomplicated: Secondary | ICD-10-CM | POA: Diagnosis not present

## 2017-01-01 DIAGNOSIS — J45901 Unspecified asthma with (acute) exacerbation: Secondary | ICD-10-CM | POA: Diagnosis not present

## 2017-01-01 DIAGNOSIS — J9801 Acute bronchospasm: Secondary | ICD-10-CM

## 2017-01-01 DIAGNOSIS — R05 Cough: Secondary | ICD-10-CM | POA: Diagnosis present

## 2017-01-01 MED ORDER — IPRATROPIUM-ALBUTEROL 0.5-2.5 (3) MG/3ML IN SOLN
3.0000 mL | Freq: Once | RESPIRATORY_TRACT | Status: AC
  Administered 2017-01-01: 3 mL via RESPIRATORY_TRACT
  Filled 2017-01-01: qty 3

## 2017-01-01 MED ORDER — ALBUTEROL SULFATE (2.5 MG/3ML) 0.083% IN NEBU
5.0000 mg | INHALATION_SOLUTION | Freq: Once | RESPIRATORY_TRACT | Status: AC
  Administered 2017-01-01: 5 mg via RESPIRATORY_TRACT

## 2017-01-01 MED ORDER — BENZONATATE 200 MG PO CAPS: 200.0000 mg | ORAL_CAPSULE | Freq: Three times a day (TID) | ORAL | 0 refills | Status: DC | PRN

## 2017-01-01 MED ORDER — PREDNISONE 20 MG PO TABS
60.0000 mg | ORAL_TABLET | Freq: Once | ORAL | Status: AC
  Administered 2017-01-01: 60 mg via ORAL
  Filled 2017-01-01: qty 3

## 2017-01-01 MED ORDER — ALBUTEROL SULFATE (2.5 MG/3ML) 0.083% IN NEBU
INHALATION_SOLUTION | RESPIRATORY_TRACT | Status: AC
  Filled 2017-01-01: qty 6

## 2017-01-01 MED ORDER — ALBUTEROL (5 MG/ML) CONTINUOUS INHALATION SOLN
10.0000 mg/h | INHALATION_SOLUTION | Freq: Once | RESPIRATORY_TRACT | Status: AC
  Administered 2017-01-01: 10 mg/h via RESPIRATORY_TRACT
  Filled 2017-01-01: qty 20

## 2017-01-01 MED ORDER — PREDNISONE 10 MG (21) PO TBPK: 10.0000 mg | ORAL_TABLET | Freq: Every day | ORAL | 0 refills | Status: DC

## 2017-01-01 NOTE — ED Provider Notes (Signed)
[facility]-EMERGENCY DEPT Provider Note   CSN: [id number] Arrival date & time: 01/01/17  1021     History   Chief Complaint Chief Complaint  Patient presents with  . Asthma    HPI John Schwartz is a 21 y.o. male.  HPI   21 year old male with a history of severe asthma resents today with asthma exacerbation.  Patient notes over the last 3 days he has had upper respiratory congestion and cough.  He notes that over the last day he has had worsening asthma exacerbation with wheezing, difficulty breathing.  Patient notes a significant history of this in the past, 3 years ago had hospital admission within intubation for respiratory failure.  Patient notes at home nebulized breathing treatments were not providing significant improvements.  Patient followed up at [facility] was treated with 2 breathing treatments and then discharged home with cough medication.  Patient had a negative chest x-ray at that time.  He reports symptoms are not improving and wants repeat evaluation.  Patient initially notes he had one 10 mg prednisone at home that he took this morning.  Patient notes shortness of breath at baseline, audible wheezes, nonproductive cough, denies any fever or chest pain.  No lower extremity swelling or edema.     Past Medical History:  Diagnosis Date  . Asthma   . Bronchospasm     Patient Active Problem List   Diagnosis Date Noted  . Asthma with status asthmaticus 06/04/2016  . Acute hypokalemia 06/04/2016  . Noncompliance with medication treatment due to underuse of medication 06/04/2016  . Acute renal insufficiency 06/04/2016  . Acute asthma exacerbation 06/04/2016  . Dyspnea 03/16/2016  . Asthma exacerbation 03/16/2016  . Status asthmaticus 02/11/2014  . Acute respiratory failure (HCC) 02/11/2014    Past Surgical History:  Procedure Laterality Date  . NO PAST SURGERIES         Home Medications    Prior to Admission medications   Medication Sig  Start Date End Date Taking? Authorizing Provider  albuterol (PROVENTIL HFA;VENTOLIN HFA) 108 (90 Base) MCG/ACT inhaler Inhale 2 puffs into the lungs every 4 (four) hours as needed for wheezing or shortness of breath. 12/28/16   Chinita Pester, FNP  albuterol (PROVENTIL) (2.5 MG/3ML) 0.083% nebulizer solution Take 3 mLs (2.5 mg total) by nebulization every 6 (six) hours as needed for wheezing or shortness of breath. 12/28/16   Chinita Pester, FNP  benzonatate (TESSALON) 200 MG capsule Take 1 capsule (200 mg total) by mouth 3 (three) times daily as needed for cough. 01/01/17   Joni Reining, PA-C  ipratropium (ATROVENT) 0.02 % nebulizer solution Take 2.5 mLs (0.5 mg total) by nebulization every 6 (six) hours as needed for wheezing or shortness of breath. 12/28/16   Chinita Pester, FNP  predniSONE (STERAPRED UNI-PAK 21 TAB) 10 MG (21) TBPK tablet Take 1 tablet (10 mg total) by mouth daily. Take 6 tabs by mouth daily  for 1 day, then 5 tabs for 1 days, then 4 tabs for 2 days, then 3 tabs for 2 days, 2 tabs for 2 days, then 1 tab by mouth daily for 2 days 01/01/17   Eyvonne Mechanic, PA-C    Family History Family History  Problem Relation Age of Onset  . Arthritis Paternal Grandmother     Social History Social History  Substance Use Topics  . Smoking status: Never Smoker  . Smokeless tobacco: Never Used  . Alcohol use No     Allergies  Patient has no known allergies.   Review of Systems Review of Systems  All other systems reviewed and are negative.    Physical Exam Updated Vital Signs BP 134/92   Pulse 96   Temp 99.1 F (37.3 C) (Oral)   Resp 20   Ht 5\' 10"  (1.778 m)   Wt 73.5 kg   SpO2 94%   BMI 23.24 kg/m   Physical Exam  Constitutional: He is oriented to person, place, and time. He appears well-developed and well-nourished.  HENT:  Head: Normocephalic and atraumatic.  Eyes: Conjunctivae are normal. Pupils are equal, round, and reactive to light. Right eye exhibits no  discharge. Left eye exhibits no discharge. No scleral icterus.  Neck: Normal range of motion. No JVD present. No tracheal deviation present.  Pulmonary/Chest: Effort normal. No stridor. No respiratory distress. He has wheezes. He has no rales. He exhibits no tenderness.  Inspiratory and expiratory wheeze, no crackles noted.  No accessory muscle use, no signs of respiratory distress  Musculoskeletal: He exhibits no edema.  Neurological: He is alert and oriented to person, place, and time. Coordination normal.  Psychiatric: He has a normal mood and affect. His behavior is normal. Judgment and thought content normal.  Nursing note and vitals reviewed.    ED Treatments / Results  Labs (all labs ordered are listed, but only abnormal results are displayed) Labs Reviewed - No data to display  EKG  EKG Interpretation None       Radiology Dg Chest 2 View  Result Date: 01/01/2017 CLINICAL DATA:  Cough, wheezing. EXAM: CHEST  2 VIEW COMPARISON:  Radiographs of November 01, 2016 FINDINGS: The heart size and mediastinal contours are within normal limits. Both lungs are clear. No pneumothorax or pleural effusion is noted. The visualized skeletal structures are unremarkable. IMPRESSION: No active cardiopulmonary disease. Electronically Signed   By: Lupita Raider, M.D.   On: 01/01/2017 08:59    Procedures Procedures (including critical care time)  Medications Ordered in ED Medications  albuterol (PROVENTIL) (2.5 MG/3ML) 0.083% nebulizer solution 5 mg (5 mg Nebulization Given 01/01/17 1048)  albuterol (PROVENTIL,VENTOLIN) solution continuous neb (10 mg/hr Nebulization Given 01/01/17 1236)  predniSONE (DELTASONE) tablet 60 mg (60 mg Oral Given 01/01/17 1218)     Initial Impression / Assessment and Plan / ED Course  I have reviewed the triage vital signs and the nursing notes.  Pertinent labs & imaging results that were available during my care of the patient were reviewed by me and considered  in my medical decision making (see chart for details).      Final Clinical Impressions(s) / ED Diagnoses   Final diagnoses:  Exacerbation of asthma, unspecified asthma severity, unspecified whether persistent    Labs:   Imaging: DG chest no significant abnormalities  Consults:  Therapeutics: Prednisone, albuterol  Discharge Meds: Prednisone, albuterol  Assessment/Plan: 20 year old male with a history of asthma presents today with asthma exacerbation.  Patient was seen at [facility] with several breathing treatments, he received 2 breathing treatments here which improved his symptoms.  Patient was also given prednisone here.  Patient has been followed by pulmonology in the past, but has not followed up recently.  They are requesting the name of the pulmonologist at [facility] as they would like to follow-up with them.  Patient has very minimal wheeze after treatment here, I personally ambulated him in the hallway wearing the O2 sensor with no hypoxic episodes, no symptomatic shortness of breath.  Patient does not  have any signs of infectious etiology today.  Patients grandmother also reports that patient has been playing basketball, most recently last night.  Patient has no findings consistent with severe exacerbation.  He will be discharged home with pulmonary follow-up and strict return precautions.  Both the patient, his mother both verbalized understanding and agreement to today's plan had no further questions or concerns at the time of discharge.    New Prescriptions New Prescriptions   PREDNISONE (STERAPRED UNI-PAK 21 TAB) 10 MG (21) TBPK TABLET    Take 1 tablet (10 mg total) by mouth daily. Take 6 tabs by mouth daily  for 1 day, then 5 tabs for 1 days, then 4 tabs for 2 days, then 3 tabs for 2 days, 2 tabs for 2 days, then 1 tab by mouth daily for 2 days     Eyvonne MechanicJeffrey Keimani Laufer, PA-C 01/01/17 1446    Pricilla LovelessScott Goldston, MD 01/07/17 [phone]

## 2017-01-01 NOTE — Discharge Instructions (Signed)
Please read attached information. If you experience any new or worsening signs or symptoms please return to the emergency room for evaluation. Please follow-up with your primary care provider or specialist as discussed. Please use medication prescribed only as directed and discontinue taking if you have any concerning signs or symptoms.   °

## 2017-01-01 NOTE — ED Notes (Signed)
ED Provider at bedside. 

## 2017-01-01 NOTE — ED Triage Notes (Addendum)
Pt reports SOB onset this am, pt seen at [facility] for symptoms today & discharged after 2 breathing treatments, pt reports no relief after using Albuterol inhaler x 6 puffs since onset of symptoms, tachypneic, pt speaks in complete sentences, ambulatory, a&o X4

## 2017-01-01 NOTE — ED Triage Notes (Signed)
Patient presents to the ED with cold symptoms and asthma exacerbation x 1 week that got worse yesterday.  Patient is ambulatory to triage without obvious difficulty.  Speaking in full sentences.  Patient reports using breathing treatments at home with only minimal relief.  Patient states he was seen in the ED about 1 week ago and was sent home after he was feeling better.

## 2017-01-01 NOTE — ED Provider Notes (Signed)
[facility] Emergency Department Provider Note   ____________________________________________   First MD Initiated Contact with Patient 01/01/17 [phone]     (approximate)  I have reviewed the triage vital signs and the nursing notes.   HISTORY  Chief Complaint Asthma    HPI John Schwartz is a 21 y.o. male patient complaining of cold symptoms and increased wheezing(ED visit one week ago. Patient state LAST visit he was given a nebulized treatment and a new medication for his asthma. Patient went home feeling better but symptoms worsened the next day has continued to this visit. Patient was advised to follow-up family doctor but state he does not have a family doctor. Always come to the emergency room or urgent care clinic with these complaints.Patient rates his pain discomfort as a 9/10. Patient stated pain increases chest with coughing.   Past Medical History:  Diagnosis Date  . Asthma   . Bronchospasm     Patient Active Problem List   Diagnosis Date Noted  . Asthma with status asthmaticus 06/04/2016  . Acute hypokalemia 06/04/2016  . Noncompliance with medication treatment due to underuse of medication 06/04/2016  . Acute renal insufficiency 06/04/2016  . Acute asthma exacerbation 06/04/2016  . Dyspnea 03/16/2016  . Asthma exacerbation 03/16/2016  . Status asthmaticus 02/11/2014  . Acute respiratory failure (HCC) 02/11/2014    Past Surgical History:  Procedure Laterality Date  . NO PAST SURGERIES      Prior to Admission medications   Medication Sig Start Date End Date Taking? Authorizing Provider  albuterol (PROVENTIL HFA;VENTOLIN HFA) 108 (90 Base) MCG/ACT inhaler Inhale 2 puffs into the lungs every 4 (four) hours as needed for wheezing or shortness of breath. 12/28/16   Chinita Pester, FNP  albuterol (PROVENTIL) (2.5 MG/3ML) 0.083% nebulizer solution Take 3 mLs (2.5 mg total) by nebulization every 6 (six) hours as needed for wheezing or  shortness of breath. 12/28/16   Chinita Pester, FNP  benzonatate (TESSALON) 200 MG capsule Take 1 capsule (200 mg total) by mouth 3 (three) times daily as needed for cough. 01/01/17   Joni Reining, PA-C  ipratropium (ATROVENT) 0.02 % nebulizer solution Take 2.5 mLs (0.5 mg total) by nebulization every 6 (six) hours as needed for wheezing or shortness of breath. 12/28/16   Chinita Pester, FNP  predniSONE (DELTASONE) 10 MG tablet Take 5 tablets (50 mg total) by mouth daily. 12/28/16   Chinita Pester, FNP    Allergies Patient has no known allergies.  Family History  Problem Relation Age of Onset  . Arthritis Paternal Grandmother     Social History Social History  Substance Use Topics  . Smoking status: Never Smoker  . Smokeless tobacco: Never Used  . Alcohol use No    Review of Systems Constitutional: No fever/chills Eyes: No visual changes. ENT: No sore throat. Cardiovascular: Denies chest pain. Respiratory: Denies shortness of breath. Wheezing and coughing Gastrointestinal: No abdominal pain.  No nausea, no vomiting.  No diarrhea.  No constipation. Genitourinary: Negative for dysuria. Musculoskeletal: Negative for back pain. Skin: Negative for rash. Neurological: Negative for headaches, focal weakness or numbness.    ____________________________________________   PHYSICAL EXAM:  VITAL SIGNS: ED Triage Vitals [01/01/17 0815]  Enc Vitals Group     BP 131/86     Pulse Rate 99     Resp (!) 22     Temp 98.7 F (37.1 C)     Temp Source Oral     SpO2  96 %     Weight      Height      Head Circumference      Peak Flow      Pain Score 9     Pain Loc      Pain Edu?      Excl. in GC?     Constitutional: Alert and oriented. Well appearing and in no acute distress. Eyes: Conjunctivae are normal. PERRL. EOMI. Head: Atraumatic. Nose: No congestion/rhinnorhea. Mouth/Throat: Mucous membranes are moist.  Oropharynx non-erythematous. Neck: No stridor.  No cervical spine  tenderness to palpation. Hematological/Lymphatic/Immunilogical: No cervical lymphadenopathy. Cardiovascular: Normal rate, regular rhythm. Grossly normal heart sounds.  Good peripheral circulation. Respiratory: Normal respiratory effort.  No retractions. Lungs CTAB. Gastrointestinal: Soft and nontender. No distention. No abdominal bruits. No CVA tenderness. Musculoskeletal: No lower extremity tenderness nor edema.  No joint effusions. Neurologic:  Normal speech and language. No gross focal neurologic deficits are appreciated. No gait instability. Skin:  Skin is warm, dry and intact. No rash noted. Psychiatric: Mood and affect are normal. Speech and behavior are normal.  ____________________________________________   LABS (all labs ordered are listed, but only abnormal results are displayed)  Labs Reviewed - No data to display ____________________________________________  EKG   ____________________________________________  RADIOLOGY  No acute findings on chest x-ray. ____________________________________________   PROCEDURES  Procedure(s) performed: None  Procedures  Critical Care performed: No  ____________________________________________   INITIAL IMPRESSION / ASSESSMENT AND PLAN / ED COURSE  Pertinent labs & imaging results that were available during my care of the patient were reviewed by me and considered in my medical decision making (see chart for details).  Cough secondary to bronchospasms. Patient given discharge care instructions. Patient given a prescription for Tessalon Perles. Patient advised to follow-up with pulmonology for definitive evaluation and treatment.      ____________________________________________   FINAL CLINICAL IMPRESSION(S) / ED DIAGNOSES  Final diagnoses:  Cough due to bronchospasm  Mild intermittent asthma without complication      NEW MEDICATIONS STARTED DURING THIS VISIT:  New Prescriptions   BENZONATATE (TESSALON) 200  MG CAPSULE    Take 1 capsule (200 mg total) by mouth 3 (three) times daily as needed for cough.     Note:  This document was prepared using Dragon voice recognition software and may include unintentional dictation errors.    Joni Reiningonald K Zaliyah Meikle, PA-C 01/01/17 [id number]    Joni Reiningonald K Antwoin Lackey, PA-C 01/01/17 [id number]    Charlynne Panderavid Hsienta Yao, MD 01/01/17 1040

## 2017-02-23 ENCOUNTER — Encounter: Payer: Self-pay | Admitting: Emergency Medicine

## 2017-02-23 ENCOUNTER — Emergency Department: Payer: Medicaid Other

## 2017-02-23 ENCOUNTER — Emergency Department
Admission: EM | Admit: 2017-02-23 | Discharge: 2017-02-23 | Disposition: A | Discharge status: Home / Self Care | Payer: Medicaid Other | Attending: Student in an Organized Health Care Education/Training Program | Admitting: Student in an Organized Health Care Education/Training Program

## 2017-02-23 DIAGNOSIS — J4541 Moderate persistent asthma with (acute) exacerbation: Secondary | ICD-10-CM

## 2017-02-23 DIAGNOSIS — J181 Lobar pneumonia, unspecified organism: Secondary | ICD-10-CM | POA: Insufficient documentation

## 2017-02-23 DIAGNOSIS — J189 Pneumonia, unspecified organism: Secondary | ICD-10-CM

## 2017-02-23 DIAGNOSIS — R0602 Shortness of breath: Secondary | ICD-10-CM | POA: Diagnosis present

## 2017-02-23 MED ORDER — PSEUDOEPH-BROMPHEN-DM 30-2-10 MG/5ML PO SYRP: 10.0000 mL | ORAL_SOLUTION | Freq: Four times a day (QID) | ORAL | 0 refills | Status: DC | PRN

## 2017-02-23 MED ORDER — ALBUTEROL SULFATE (2.5 MG/3ML) 0.083% IN NEBU
5.0000 mg | INHALATION_SOLUTION | Freq: Once | RESPIRATORY_TRACT | Status: AC
  Administered 2017-02-23: 5 mg via RESPIRATORY_TRACT
  Filled 2017-02-23: qty 6

## 2017-02-23 MED ORDER — PREDNISONE 50 MG PO TABS: 50.0000 mg | ORAL_TABLET | Freq: Every day | ORAL | 0 refills | Status: DC

## 2017-02-23 MED ORDER — IPRATROPIUM-ALBUTEROL 0.5-2.5 (3) MG/3ML IN SOLN
3.0000 mL | Freq: Once | RESPIRATORY_TRACT | Status: AC
  Administered 2017-02-23: 3 mL via RESPIRATORY_TRACT
  Filled 2017-02-23: qty 3

## 2017-02-23 MED ORDER — ALBUTEROL SULFATE HFA 108 (90 BASE) MCG/ACT IN AERS: 2.0000 | INHALATION_SPRAY | RESPIRATORY_TRACT | 0 refills | Status: DC | PRN

## 2017-02-23 MED ORDER — METHYLPREDNISOLONE SODIUM SUCC 125 MG IJ SOLR
125.0000 mg | Freq: Once | INTRAMUSCULAR | Status: AC
  Administered 2017-02-23: 125 mg via INTRAMUSCULAR
  Filled 2017-02-23: qty 2

## 2017-02-23 MED ORDER — LEVOFLOXACIN 750 MG PO TABS: 750.0000 mg | ORAL_TABLET | Freq: Every day | ORAL | 0 refills | Status: AC

## 2017-02-23 NOTE — ED Provider Notes (Signed)
[facility] Emergency Department Provider Note  ____________________________________________  Time seen: Approximately 5:45 PM  I have reviewed the triage vital signs and the nursing notes.   HISTORY  Chief Complaint Shortness of Breath    HPI John Schwartz is a 21 y.o. male who presents emergency department complaining of coughing, shortness of breath, audible wheezing. Patient reports that symptoms have been ongoing 2 days. Patient reports that yesterday he did have a fever but denies fever today. Patient reports that he has a significant history of asthma and reports that this does appear to be an asthma exacerbation. He has used albuterol at home with only mild relief. Patient denies any headache, visual changes, nasal congestion, sore throat, abdominal pain, nausea or vomiting, diarrhea or constipation.   Past Medical History:  Diagnosis Date  . Asthma   . Bronchospasm     Patient Active Problem List   Diagnosis Date Noted  . Asthma with status asthmaticus 06/04/2016  . Acute hypokalemia 06/04/2016  . Noncompliance with medication treatment due to underuse of medication 06/04/2016  . Acute renal insufficiency 06/04/2016  . Acute asthma exacerbation 06/04/2016  . Dyspnea 03/16/2016  . Asthma exacerbation 03/16/2016  . Status asthmaticus 02/11/2014  . Acute respiratory failure (HCC) 02/11/2014    Past Surgical History:  Procedure Laterality Date  . NO PAST SURGERIES      Prior to Admission medications   Medication Sig Start Date End Date Taking? Authorizing Provider  albuterol (PROVENTIL HFA;VENTOLIN HFA) 108 (90 Base) MCG/ACT inhaler Inhale 2 puffs into the lungs every 4 (four) hours as needed for wheezing or shortness of breath. 02/23/17   Delorise Royals Cuthriell, PA-C  brompheniramine-pseudoephedrine-DM 30-2-10 MG/5ML syrup Take 10 mLs by mouth 4 (four) times daily as needed. 02/23/17   Delorise Royals Cuthriell, PA-C  levofloxacin (LEVAQUIN) 750  MG tablet Take 1 tablet (750 mg total) by mouth daily. 02/23/17 03/05/17  Christiane Ha D Cuthriell, PA-C  predniSONE (DELTASONE) 50 MG tablet Take 1 tablet (50 mg total) by mouth daily with breakfast. 02/23/17   Delorise Royals Cuthriell, PA-C    Allergies Patient has no known allergies.  Family History  Problem Relation Age of Onset  . Arthritis Paternal Grandmother     Social History Social History  Substance Use Topics  . Smoking status: Never Smoker  . Smokeless tobacco: Never Used  . Alcohol use No     Review of Systems  Constitutional: Positive fever/chills Eyes: No visual changes. No discharge ENT: No upper respiratory complaints. Cardiovascular: Negative for chest tightness. Respiratory:Positive cough. Positive SOB. Gastrointestinal: No abdominal pain.  No nausea, no vomiting.  No diarrhea.  No constipation. Musculoskeletal: Negative for musculoskeletal pain. Skin: Negative for rash, abrasions, lacerations, ecchymosis. Neurological: Negative for headaches, focal weakness or numbness. 10-point ROS otherwise negative.  ____________________________________________   PHYSICAL EXAM:  VITAL SIGNS: ED Triage Vitals  Enc Vitals Group     BP --      Pulse --      Resp 02/23/17 1648 18     Temp 02/23/17 1648 99.3 F (37.4 C)     Temp Source 02/23/17 1648 Oral     SpO2 02/23/17 1648 97 %     Weight 02/23/17 1649 162 lb (73.5 kg)     Height 02/23/17 1649  (1.778 m)     Head Circumference --      Peak Flow --      Pain Score --      Pain Loc --  Pain Edu? --      Excl. in GC? --      Constitutional: Alert and oriented. Well appearing and in no acute distress. Eyes: Conjunctivae are normal. PERRL. EOMI. Head: Atraumatic. ENT:      Ears:       Nose: No congestion/rhinnorhea.      Mouth/Throat: Mucous membranes are moist.  Neck: No stridor.  Hematological/Lymphatic/Immunilogical: No cervical lymphadenopathy. Cardiovascular: Normal rate, regular rhythm. Normal  S1 and S2.  Good peripheral circulation. Respiratory: Normal respiratory effort without tachypnea or retractions. Lungs With significant wheezing to the right middle and lower lobe. Crackles are auscultated in the right lmiddle lobe. Left lung fields are clear.Peri Jefferson air entry to the bases with no decreased or absent breath sounds. Musculoskeletal: Full range of motion to all extremities. No gross deformities appreciated. Neurologic:  Normal speech and language. No gross focal neurologic deficits are appreciated.  Skin:  Skin is warm, dry and intact. No rash noted. Psychiatric: Mood and affect are normal. Speech and behavior are normal. Patient exhibits appropriate insight and judgement.   ____________________________________________   LABS (all labs ordered are listed, but only abnormal results are displayed)  Labs Reviewed - No data to display ____________________________________________  EKG  EKG reveals normal sinus rhythm with sinus arrhythmia at a rate of 72 bpm. Minimal ST elevation in lateral leads with no reciprocal changes. PR, QRS, QT intervals within normal limits. Normal axis. No Q waves or delta waves identified. ____________________________________________  RADIOLOGY Festus Barren Cuthriell, personally viewed and evaluated these images (plain radiographs) as part of my medical decision making, as well as reviewing the written report by the radiologist.  Dg Chest 2 View  Result Date: 02/23/2017 CLINICAL DATA:  Shortness of breath upon awakening this afternoon, history asthma, wheezing, chest tightness with coughing EXAM: CHEST  2 VIEW COMPARISON:  01/01/2017 FINDINGS: Normal heart size, mediastinal contours, and pulmonary vascularity. RIGHT middle lobe consolidation consistent with pneumonia. Questionable additional infiltrate in RIGHT lower lobe. Remaining lungs clear. No pleural effusion or pneumothorax. Bones unremarkable. IMPRESSION: RIGHT middle lobe consolidation  consistent with pneumonia. Question minimal additional infiltrate in RIGHT lower lobe. Electronically Signed   By: Ulyses Southward M.D.   On: 02/23/2017 17:29    ____________________________________________    PROCEDURES  Procedure(s) performed:    Procedures    Medications  methylPREDNISolone sodium succinate (SOLU-MEDROL) 125 mg/2 mL injection 125 mg (not administered)  ipratropium-albuterol (DUONEB) 0.5-2.5 (3) MG/3ML nebulizer solution 3 mL (not administered)  albuterol (PROVENTIL) (2.5 MG/3ML) 0.083% nebulizer solution 5 mg (5 mg Nebulization Given 02/23/17 1652)     ____________________________________________   INITIAL IMPRESSION / ASSESSMENT AND PLAN / ED COURSE  Pertinent labs & imaging results that were available during my care of the patient were reviewed by me and considered in my medical decision making (see chart for details).  Review of the [place] CSRS was performed in accordance of the [facility] prior to dispensing any controlled drugs.     Patient's diagnosis is consistent with Community acquired pneumonia with asthma exacerbation. Chest x-ray reveals consolidation in the right middle lobe consistent with pneumonia. This is consistent with physical exam findings. Patient has a history of persistent asthma with exacerbation of underlying pneumonia. Patient will be discharged home with antibiotics, prednisone, refill albuterol and cough syrup. Patient will follow up with primary care as needed..  Patient is given ED precautions to return to the ED for any worsening or new symptoms.     ____________________________________________  FINAL  CLINICAL IMPRESSION(S) / ED DIAGNOSES  Final diagnoses:  Community acquired pneumonia of right middle lobe of lung (HCC)  Moderate persistent asthma with exacerbation      NEW MEDICATIONS STARTED DURING THIS VISIT:  New Prescriptions   ALBUTEROL (PROVENTIL HFA;VENTOLIN HFA) 108 (90 BASE) MCG/ACT INHALER    Inhale 2 puffs into the  lungs every 4 (four) hours as needed for wheezing or shortness of breath.   BROMPHENIRAMINE-PSEUDOEPHEDRINE-DM 30-2-10 MG/5ML SYRUP    Take 10 mLs by mouth 4 (four) times daily as needed.   LEVOFLOXACIN (LEVAQUIN) 750 MG TABLET    Take 1 tablet (750 mg total) by mouth daily.   PREDNISONE (DELTASONE) 50 MG TABLET    Take 1 tablet (50 mg total) by mouth daily with breakfast.        This chart was dictated using voice recognition software/Dragon. Despite best efforts to proofread, errors can occur which can change the meaning. Any change was purely unintentional.    Racheal Patches, PA-C 02/23/17 1813    Willy Eddy, MD 02/23/17 2115

## 2017-02-23 NOTE — ED Triage Notes (Signed)
Patient presents to the ED with shortness of breath that began suddenly when he woke up this afternoon.  Patient reports history of asthma and states this feels the same.  Patient is wheezing slightly.  Patient reports using his inhaler with little improvement.  Patient reports feeling chest tightness when he coughs.

## 2017-02-23 NOTE — ED Notes (Signed)
See triage note   States he has had some wheezing over the past few days  Occasional prod cough   Fever yesterday but low grade today  States he feels like he is not able to get a good breath

## 2017-05-27 ENCOUNTER — Encounter: Payer: Self-pay | Admitting: Emergency Medicine

## 2017-05-27 ENCOUNTER — Emergency Department: Payer: Self-pay

## 2017-05-27 ENCOUNTER — Emergency Department
Admission: EM | Admit: 2017-05-27 | Discharge: 2017-05-27 | Disposition: A | Discharge status: Home / Self Care | Payer: Self-pay | Attending: Emergency Medicine | Admitting: Emergency Medicine

## 2017-05-27 DIAGNOSIS — Y999 Unspecified external cause status: Secondary | ICD-10-CM | POA: Insufficient documentation

## 2017-05-27 DIAGNOSIS — Z79899 Other long term (current) drug therapy: Secondary | ICD-10-CM | POA: Insufficient documentation

## 2017-05-27 DIAGNOSIS — Y9367 Activity, basketball: Secondary | ICD-10-CM | POA: Insufficient documentation

## 2017-05-27 DIAGNOSIS — S63634A Sprain of interphalangeal joint of right ring finger, initial encounter: Secondary | ICD-10-CM

## 2017-05-27 DIAGNOSIS — W01198A Fall on same level from slipping, tripping and stumbling with subsequent striking against other object, initial encounter: Secondary | ICD-10-CM | POA: Insufficient documentation

## 2017-05-27 DIAGNOSIS — S93601A Unspecified sprain of right foot, initial encounter: Secondary | ICD-10-CM | POA: Insufficient documentation

## 2017-05-27 DIAGNOSIS — J45909 Unspecified asthma, uncomplicated: Secondary | ICD-10-CM | POA: Insufficient documentation

## 2017-05-27 DIAGNOSIS — S638X1A Sprain of other part of right wrist and hand, initial encounter: Secondary | ICD-10-CM | POA: Insufficient documentation

## 2017-05-27 DIAGNOSIS — T1490XA Injury, unspecified, initial encounter: Secondary | ICD-10-CM

## 2017-05-27 DIAGNOSIS — Y92310 Basketball court as the place of occurrence of the external cause: Secondary | ICD-10-CM | POA: Insufficient documentation

## 2017-05-27 MED ORDER — IBUPROFEN 800 MG PO TABS
800.0000 mg | ORAL_TABLET | Freq: Once | ORAL | Status: AC
  Administered 2017-05-27: 800 mg via ORAL
  Filled 2017-05-27: qty 1

## 2017-05-27 MED ORDER — IBUPROFEN 800 MG PO TABS: 800.0000 mg | ORAL_TABLET | Freq: Three times a day (TID) | ORAL | 0 refills | Status: DC | PRN

## 2017-05-27 NOTE — ED Triage Notes (Signed)
Pt ambulatory to triage with slow limp. Pt reports he was playing basketball when he fell onto the court, landing on right foot and right hand. Pt c/o right 2nd toe pain and right ring finger pain. No deformity noted.

## 2017-05-27 NOTE — Discharge Instructions (Signed)
These use crutches as needed for weightbearing until you are able to ambulate with no limp. Rest ice and elevate the right foot. Use aluminum thumb splint for the right ring finger for one week, after one week buddy tape the third to the fourth digits. Follow-up with orthopedics in one week if pain has not improved.

## 2017-05-27 NOTE — ED Notes (Signed)
Patient playing basketball and jumped up guy was under him and landing on his foot wrong and his finger. Does have swelling to the finger. Patient able to move it and unable to walk on foot.

## 2017-05-27 NOTE — ED Provider Notes (Signed)
[facility]-EMERGENCY DEPARTMENT Provider Note   CSN: [id number] Arrival date & time: 05/27/17  2211     History   Chief Complaint Chief Complaint  Patient presents with  . Toe Injury  . Hand Injury    HPI John Schwartz is a 21 y.o. male presents to the emergency department for evaluation of right fourth digit and right foot pain. Patient states he was playing basketball just prior to arrival when he was undercut, landed on his right second third and fourth toes and falling backwards jamming his right fourth finger. He has pain along the dorsal aspect of the second third and fourth MTP joints. He has pain on the right hand fourth digit PIP joint. He was given Tylenol at the time of accident. His pain is currently 8 out of 10. He denies any head injury, headache, nausea, vomiting or any other injury to his body.  HPI  Past Medical History:  Diagnosis Date  . Asthma   . Bronchospasm     Patient Active Problem List   Diagnosis Date Noted  . Asthma with status asthmaticus 06/04/2016  . Acute hypokalemia 06/04/2016  . Noncompliance with medication treatment due to underuse of medication 06/04/2016  . Acute renal insufficiency 06/04/2016  . Acute asthma exacerbation 06/04/2016  . Dyspnea 03/16/2016  . Asthma exacerbation 03/16/2016  . Status asthmaticus 02/11/2014  . Acute respiratory failure (HCC) 02/11/2014    Past Surgical History:  Procedure Laterality Date  . NO PAST SURGERIES         Home Medications    Prior to Admission medications   Medication Sig Start Date End Date Taking? Authorizing Provider  albuterol (PROVENTIL HFA;VENTOLIN HFA) 108 (90 Base) MCG/ACT inhaler Inhale 2 puffs into the lungs every 4 (four) hours as needed for wheezing or shortness of breath. 02/23/17   Cuthriell, Delorise RoyalsJonathan D, PA-C  brompheniramine-pseudoephedrine-DM 30-2-10 MG/5ML syrup Take 10 mLs by mouth 4 (four) times daily as needed. 02/23/17   Cuthriell, Delorise RoyalsJonathan D, PA-C  ibuprofen  (ADVIL,MOTRIN) 800 MG tablet Take 1 tablet (800 mg total) by mouth every 8 (eight) hours as needed. 05/27/17   Evon SlackGaines, Otha Rickles C, PA-C  predniSONE (DELTASONE) 50 MG tablet Take 1 tablet (50 mg total) by mouth daily with breakfast. 02/23/17   Cuthriell, Delorise RoyalsJonathan D, PA-C    Family History Family History  Problem Relation Age of Onset  . Arthritis Paternal Grandmother     Social History Social History  Substance Use Topics  . Smoking status: Never Smoker  . Smokeless tobacco: Never Used  . Alcohol use No     Allergies   Patient has no known allergies.   Review of Systems Review of Systems  Respiratory: Negative for shortness of breath.   Cardiovascular: Negative for chest pain and leg swelling.  Musculoskeletal: Positive for arthralgias and joint swelling. Negative for back pain and neck pain.  Skin: Negative for rash and wound.  Neurological: Negative for dizziness, syncope, light-headedness, numbness and headaches.  Psychiatric/Behavioral: Negative for confusion and decreased concentration.     Physical Exam Updated Vital Signs BP 127/74 (BP Location: Right Arm)   Pulse 85   Temp 97.6 F (36.4 C) (Oral)   Resp 16   Ht 5\' 10"  (1.778 m)   Wt 77.1 kg (170 lb)   SpO2 99%   BMI 24.39 kg/m   Physical Exam  Constitutional: He is oriented to person, place, and time. He appears well-developed and well-nourished.  HENT:  Head: Normocephalic and atraumatic.  Right Ear:  External ear normal.  Left Ear: External ear normal.  Eyes: Pupils are equal, round, and reactive to light. Conjunctivae and EOM are normal.  Neck: Normal range of motion.  Cardiovascular: Normal rate.   Pulmonary/Chest: Effort normal. No respiratory distress.  Musculoskeletal: Normal range of motion. He exhibits tenderness. He exhibits no edema.  Examination of the right hand shows patient is full composite fist. No tendon deficit noted. He has swelling and tenderness to the right fourth PIP joint. He is  able to maintain full active extension of the fourth digit. Examination the right foot shows patient is tender along the dorsal aspect of the second third and fourth metatarsophalangeal joints. There is no deformity noted. No skin breakdown noted. He is nontender throughout the ankle or base of the fifth metatarsal. He has good ankle plantarflexion dorsiflexion.  Neurological: He is alert and oriented to person, place, and time.  Skin: Skin is warm.  Psychiatric: He has a normal mood and affect. His behavior is normal. Judgment and thought content normal.     ED Treatments / Results  Labs (all labs ordered are listed, but only abnormal results are displayed) Labs Reviewed - No data to display  EKG  EKG Interpretation None       Radiology Dg Hand Complete Right  Result Date: 05/27/2017 CLINICAL DATA:  Initial evaluation for acute trauma, fall, pain at right main finger. EXAM: RIGHT HAND - COMPLETE 3+ VIEW COMPARISON:  None. FINDINGS: There is no evidence of fracture or dislocation. There is no evidence of arthropathy or other focal bone abnormality. Mild focal soft tissue swelling about the right fourth PIP joint. IMPRESSION: 1. No acute fracture or dislocation. 2. Mild focal soft tissue swelling about the right fourth PIP joint. Electronically Signed   By: Rise Mu M.D.   On: 05/27/2017 22:40   Dg Foot Complete Right  Result Date: 05/27/2017 CLINICAL DATA:  Initial evaluation for acute trauma, fall. Pain at second toe. EXAM: RIGHT FOOT COMPLETE - 3+ VIEW COMPARISON:  None. FINDINGS: There is no evidence of fracture or dislocation. There is no evidence of arthropathy or other focal bone abnormality. Soft tissues are unremarkable. IMPRESSION: No acute osseous abnormality about the right foot. Electronically Signed   By: Rise Mu M.D.   On: 05/27/2017 22:37    Procedures Procedures (including critical care time) SPLINT APPLICATION Date/Time: 11:10 PM Authorized  by: Patience Musca Consent: Verbal consent obtained. Risks and benefits: risks, benefits and alternatives were discussed Consent given by: patient Splint applied by: ED PA Location details: Right fourth digit aluminum/foam splint with buddy tape, right foot  Splint type: Postop shoe right foot, aluminum/foam splint right fourth digit  Supplies used: Aluminum/foam splint, postop shoe  Post-procedure: The splinted body part was neurovascularly unchanged following the procedure. Patient tolerance: Patient tolerated the procedure well with no immediate complications.     Medications Ordered in ED Medications  ibuprofen (ADVIL,MOTRIN) tablet 800 mg (800 mg Oral Given 05/27/17 2305)     Initial Impression / Assessment and Plan / ED Course  I have reviewed the triage vital signs and the nursing notes.  Pertinent labs & imaging results that were available during my care of the patient were reviewed by me and considered in my medical decision making (see chart for details).     21 year old male with right fourth PIP joint sprain, right foot sprain. He is given crutches, postop shoe as well as aluminum/foam splint. Ibuprofen is given in the emergency department. He  is educated on his injuries. He will follow-up with orthopedics as needed. He is educated on signs and symptoms to return to the ED for.  Final Clinical Impressions(s) / ED Diagnoses   Final diagnoses:  Injury  Sprain of interphalangeal joint of right ring finger, initial encounter  Foot sprain, right, initial encounter    New Prescriptions New Prescriptions   IBUPROFEN (ADVIL,MOTRIN) 800 MG TABLET    Take 1 tablet (800 mg total) by mouth every 8 (eight) hours as needed.     Evon Slack, PA-C 05/27/17 2313    Nita Sickle, MD 05/28/17 [phone]

## 2017-08-23 ENCOUNTER — Emergency Department: Payer: Self-pay

## 2017-08-23 ENCOUNTER — Emergency Department
Admission: EM | Admit: 2017-08-23 | Discharge: 2017-08-23 | Disposition: A | Discharge status: Home / Self Care | Payer: Self-pay | Attending: Emergency Medicine | Admitting: Emergency Medicine

## 2017-08-23 ENCOUNTER — Encounter: Payer: Self-pay | Admitting: Emergency Medicine

## 2017-08-23 DIAGNOSIS — J4541 Moderate persistent asthma with (acute) exacerbation: Secondary | ICD-10-CM | POA: Insufficient documentation

## 2017-08-23 DIAGNOSIS — R0602 Shortness of breath: Secondary | ICD-10-CM

## 2017-08-23 DIAGNOSIS — Z79899 Other long term (current) drug therapy: Secondary | ICD-10-CM | POA: Insufficient documentation

## 2017-08-23 LAB — CBC WITH DIFFERENTIAL/PLATELET
BASOS PCT: 1 %
Basophils Absolute: 0.1 10*3/uL (ref 0–0.1)
EOS ABS: 1 10*3/uL — AB (ref 0–0.7)
EOS PCT: 11 %
HCT: 44.1 % (ref 40.0–52.0)
Hemoglobin: 14.6 g/dL (ref 13.0–18.0)
LYMPHS PCT: 33 %
Lymphs Abs: 3 10*3/uL (ref 1.0–3.6)
MCH: 27.1 pg (ref 26.0–34.0)
MCHC: 33.1 g/dL (ref 32.0–36.0)
MCV: 81.9 fL (ref 80.0–100.0)
MONO ABS: 0.6 10*3/uL (ref 0.2–1.0)
Monocytes Relative: 6 %
Neutro Abs: 4.3 10*3/uL (ref 1.4–6.5)
Neutrophils Relative %: 49 %
PLATELETS: 258 10*3/uL (ref 150–440)
RBC: 5.39 MIL/uL (ref 4.40–5.90)
RDW: 12.9 % (ref 11.5–14.5)
WBC: 8.9 10*3/uL (ref 3.8–10.6)

## 2017-08-23 LAB — TROPONIN I

## 2017-08-23 LAB — BASIC METABOLIC PANEL
ANION GAP: 8 (ref 5–15)
BUN: 17 mg/dL (ref 6–20)
CALCIUM: 9.2 mg/dL (ref 8.9–10.3)
CO2: 24 mmol/L (ref 22–32)
Chloride: 106 mmol/L (ref 101–111)
Creatinine, Ser: 1.1 mg/dL (ref 0.61–1.24)
GFR calc non Af Amer: >60 mL/min (ref >60)
GLUCOSE: 107 mg/dL — AB (ref 65–99)
POTASSIUM: 4 mmol/L (ref 3.5–5.1)
SODIUM: 138 mmol/L (ref 135–145)

## 2017-08-23 MED ORDER — PREDNISONE 10 MG (21) PO TBPK: ORAL_TABLET | Freq: Every day | ORAL | 0 refills | Status: DC

## 2017-08-23 MED ORDER — IPRATROPIUM-ALBUTEROL 0.5-2.5 (3) MG/3ML IN SOLN
3.0000 mL | Freq: Once | RESPIRATORY_TRACT | Status: AC
  Administered 2017-08-23: 3 mL via RESPIRATORY_TRACT

## 2017-08-23 MED ORDER — HYDROCOD POLST-CPM POLST ER 10-8 MG/5ML PO SUER: 5.0000 mL | Freq: Two times a day (BID) | ORAL | 0 refills | Status: DC

## 2017-08-23 MED ORDER — METHYLPREDNISOLONE SODIUM SUCC 125 MG IJ SOLR
INTRAMUSCULAR | Status: AC
  Administered 2017-08-23: 125 mg via INTRAVENOUS
  Filled 2017-08-23: qty 2

## 2017-08-23 MED ORDER — ALBUTEROL SULFATE HFA 108 (90 BASE) MCG/ACT IN AERS: 2.0000 | INHALATION_SPRAY | Freq: Four times a day (QID) | RESPIRATORY_TRACT | 2 refills | Status: DC | PRN

## 2017-08-23 MED ORDER — IPRATROPIUM-ALBUTEROL 0.5-2.5 (3) MG/3ML IN SOLN
RESPIRATORY_TRACT | Status: AC
  Administered 2017-08-23: 3 mL via RESPIRATORY_TRACT
  Filled 2017-08-23: qty 9

## 2017-08-23 MED ORDER — METHYLPREDNISOLONE SODIUM SUCC 125 MG IJ SOLR
125.0000 mg | Freq: Once | INTRAMUSCULAR | Status: AC
  Administered 2017-08-23: 125 mg via INTRAVENOUS

## 2017-08-23 NOTE — ED Provider Notes (Signed)
[facility] Emergency Department Provider Note   ____________________________________________   First MD Initiated Contact with Patient 08/23/17 [phone]     (approximate)  I have reviewed the triage vital signs and the nursing notes.   HISTORY  Chief Complaint Asthma    HPI John Schwartz is a 21 y.o. male who presents to the ED from home with a chief complaint of shortness of breath. Patient has a history of asthma with prior intubation, most recently 2 years ago, who has been experiencing nonproductive cough and wheezing with progressive shortness of breath over the past day. Has been using his inhaler nebulizer at home without relief of symptoms. Symptoms associated with chest tightness. Denies associated fever, chills, abdominal pain, nausea, vomiting.Denies recent travel or trauma. Nothing makes his symptoms better or worse.   Past Medical History:  Diagnosis Date  . Asthma   . Bronchospasm     Patient Active Problem List   Diagnosis Date Noted  . Asthma with status asthmaticus 06/04/2016  . Acute hypokalemia 06/04/2016  . Noncompliance with medication treatment due to underuse of medication 06/04/2016  . Acute renal insufficiency 06/04/2016  . Acute asthma exacerbation 06/04/2016  . Dyspnea 03/16/2016  . Asthma exacerbation 03/16/2016  . Status asthmaticus 02/11/2014  . Acute respiratory failure (HCC) 02/11/2014    Past Surgical History:  Procedure Laterality Date  . NO PAST SURGERIES      Prior to Admission medications   Medication Sig Start Date End Date Taking? Authorizing Provider  albuterol (PROVENTIL HFA;VENTOLIN HFA) 108 (90 Base) MCG/ACT inhaler Inhale 2 puffs into the lungs every 4 (four) hours as needed for wheezing or shortness of breath. 02/23/17   Cuthriell, Delorise Royals, PA-C  brompheniramine-pseudoephedrine-DM 30-2-10 MG/5ML syrup Take 10 mLs by mouth 4 (four) times daily as needed. 02/23/17   Cuthriell, Delorise Royals, PA-C    ibuprofen (ADVIL,MOTRIN) 800 MG tablet Take 1 tablet (800 mg total) by mouth every 8 (eight) hours as needed. 05/27/17   Evon Slack, PA-C  predniSONE (DELTASONE) 50 MG tablet Take 1 tablet (50 mg total) by mouth daily with breakfast. 02/23/17   Cuthriell, Delorise Royals, PA-C    Allergies Patient has no known allergies.  Family History  Problem Relation Age of Onset  . Arthritis Paternal Grandmother     Social History Social History  Substance Use Topics  . Smoking status: Never Smoker  . Smokeless tobacco: Never Used  . Alcohol use No    Review of Systems  Constitutional: No fever/chills. Eyes: No visual changes. ENT: No sore throat. Cardiovascular: positive for chest pain. Respiratory: positive for shortness of breath. Gastrointestinal: No abdominal pain.  No nausea, no vomiting.  No diarrhea.  No constipation. Genitourinary: Negative for dysuria. Musculoskeletal: Negative for back pain. Skin: Negative for rash. Neurological: Negative for headaches, focal weakness or numbness.   ____________________________________________   PHYSICAL EXAM:  VITAL SIGNS: ED Triage Vitals [08/23/17 0608]  Enc Vitals Group     BP      Pulse Rate 84     Resp (!) 24     Temp      Temp src      SpO2 98 %     Weight 164 lb (74.4 kg)     Height  (1.778 m)     Head Circumference      Peak Flow      Pain Score      Pain Loc      Pain Edu?  Excl. in GC?     Constitutional: Alert and oriented. Well appearing and in moderate acute distress. Eyes: Conjunctivae are normal. PERRL. EOMI. Head: Atraumatic. Nose: No congestion/rhinnorhea. Mouth/Throat: Mucous membranes are moist.  Oropharynx non-erythematous. Neck: No stridor.   Cardiovascular: Normal rate, regular rhythm. Grossly normal heart sounds.  Good peripheral circulation. Respiratory: Increased respiratory effort.  Tripoding. No retractions. Lungs diminished bibasilarly. Gastrointestinal: Soft and nontender. No  distention. No abdominal bruits. No CVA tenderness. Musculoskeletal: No lower extremity tenderness nor edema.  No joint effusions. Neurologic:  Normal speech and language. No gross focal neurologic deficits are appreciated.  Skin:  Skin is warm, dry and intact. No rash noted. Psychiatric: Mood and affect are normal. Speech and behavior are normal.  ____________________________________________   LABS (all labs ordered are listed, but only abnormal results are displayed)  Labs Reviewed  CBC WITH DIFFERENTIAL/PLATELET  BASIC METABOLIC PANEL  TROPONIN I   ____________________________________________  EKG  ED ECG REPORT I, Odean Fester J, the attending physician, personally viewed and interpreted this ECG.   Date: 08/23/2017  EKG Time: 0615  Rate: 73  Rhythm: normal EKG, normal sinus rhythm  Axis: borderline RAD  Intervals:none  ST&T Change: nonspecific  ____________________________________________  RADIOLOGY  No results found.  ____________________________________________   PROCEDURES  Procedure(s) performed: None  Procedures  Critical Care performed: No  ____________________________________________   INITIAL IMPRESSION / ASSESSMENT AND PLAN / ED COURSE  As part of my medical decision making, I reviewed the following data within the electronic MEDICAL RECORD NUMBER Nursing notes reviewed and incorporated, Interpreter needed, Labs reviewed, EKG interpreted, Radiograph reviewed and Notes from prior ED visits.   21 year old male with history of asthma who presents with acute exacerbation. Room air saturations 98-100%. Differential includes, but is not limited to, viral syndrome, bronchitis including COPD exacerbation, pneumonia, reactive airway disease including asthma, CHF including exacerbation with or without pulmonary/interstitial edema, pneumothorax, ACS, thoracic trauma, and pulmonary embolism. Will initiate DuoNeb, IV Solu-Medrol, check screening labs, chest x-ray,  and reassess.  Clinical Course as of Aug 24 655  [facility] Aug 23, 2017  7829 Much better aeration after 3 stacked DuoNeb treatments. Patient appears more comfortable. Wheezing appreciated. Room air saturations 99%. Updated patient and spouse of laboratory and x-ray imaging results. Will continue to monitor in emergency department. Consider additional DuoNeb/hospitalization as needed. Care transferred to Dr. Mayford Knife pending reassessment and disposition.  [JS]    Clinical Course User Index [JS] Irean Hong, MD     ____________________________________________   FINAL CLINICAL IMPRESSION(S) / ED DIAGNOSES  Final diagnoses:  Moderate persistent asthma with exacerbation  Shortness of breath      NEW MEDICATIONS STARTED DURING THIS VISIT:  New Prescriptions   No medications on file     Note:  This document was prepared using Dragon voice recognition software and may include unintentional dictation errors.    Irean Hong, MD 08/23/17 [phone]

## 2017-08-23 NOTE — ED Triage Notes (Signed)
Patient has a history of asthma. Patient states that he has been short of breath times two days. Patient states that he used his last nebulizer treatment about 30 minutes prior to arrival. Patient with audible wheezes.

## 2017-08-23 NOTE — ED Provider Notes (Signed)
patient continues to look well, will be discharged with albuterol, steroids and outpatient follow-up.   Emily Filbert, MD 08/23/17 [phone]

## 2017-10-15 ENCOUNTER — Emergency Department
Admission: EM | Admit: 2017-10-15 | Discharge: 2017-10-15 | Disposition: A | Discharge status: Home / Self Care | Payer: Medicaid Other | Attending: Student in an Organized Health Care Education/Training Program | Admitting: Student in an Organized Health Care Education/Training Program

## 2017-10-15 ENCOUNTER — Other Ambulatory Visit: Payer: Self-pay

## 2017-10-15 ENCOUNTER — Encounter: Payer: Self-pay | Admitting: Emergency Medicine

## 2017-10-15 DIAGNOSIS — J45909 Unspecified asthma, uncomplicated: Secondary | ICD-10-CM | POA: Insufficient documentation

## 2017-10-15 DIAGNOSIS — Z79899 Other long term (current) drug therapy: Secondary | ICD-10-CM | POA: Insufficient documentation

## 2017-10-15 DIAGNOSIS — K0889 Other specified disorders of teeth and supporting structures: Secondary | ICD-10-CM | POA: Insufficient documentation

## 2017-10-15 MED ORDER — AMOXICILLIN 500 MG PO TABS: 500.0000 mg | ORAL_TABLET | Freq: Three times a day (TID) | ORAL | 0 refills | Status: AC

## 2017-10-15 MED ORDER — KETOROLAC TROMETHAMINE 30 MG/ML IJ SOLN
30.0000 mg | Freq: Once | INTRAMUSCULAR | Status: AC
  Administered 2017-10-15: 30 mg via INTRAMUSCULAR
  Filled 2017-10-15: qty 1

## 2017-10-15 MED ORDER — KETOROLAC TROMETHAMINE 10 MG PO TABS: 10.0000 mg | ORAL_TABLET | Freq: Four times a day (QID) | ORAL | 0 refills | Status: AC | PRN

## 2017-10-15 NOTE — Discharge Instructions (Signed)
OPTIONS FOR DENTAL FOLLOW UP CARE ° °[place] Department of Health and Human Services - Local Safety Net Dental Clinics °http://www.ncdhhs.gov/dph/oralhealth/services/safetynetclinics.htm °  °[facility] (336-562-3123) ° °[facility] [place] ([phone]) ° °[facility] ([phone] ext [phone]) ° °[facility] ([phone]) ° °[facility] ([phone]) °This clinic caters to the indigent population and is on a lottery system. °Location: °[facility], [address], [place] °Clinic Hours: °Wednesdays from 6pm - 9pm, patients seen by a lottery system. °For dates, call or go to [email or web address].[facility].edu/[facility]/patients/Dental-[facility] °Services: °Cleanings, fillings and simple extractions. °Payment Options: °DENTAL WORK IS FREE OF CHARGE. Bring proof of income or support. °Best way to get seen: °Arrive at 5:15 pm - this is a lottery, NOT first come/first serve, so arriving earlier will not increase your chances of being seen. °  °  °[facility] °[phone] °Select option 1 for emergencies °  °Location: °[facility], [address], [place] °Clinic Hours: °No walk-ins accepted - call the day before to schedule an appointment. °Check in times are 9:30 am and 1:30 pm. °Services: °Simple extractions, temporary fillings, pulpectomy/pulp debridement, uncomplicated abscess drainage. °Payment Options: °PAYMENT IS DUE AT THE TIME OF SERVICE.  Fee is usually $100-200, additional surgical procedures (e.g. abscess drainage) may be extra. °Cash, checks, Visa/MasterCard accepted.  Can file Medicaid if patient is covered for dental - patient should call case worker to check. °No discount for [facility] patients. °Best way to get seen: °MUST call the day before and get onto the schedule. Can usually be seen the next 1-2 days. No walk-ins accepted. °  °  °[facility] °[phone] °   °Location: °[facility], [address], [place] °Clinic Hours: °M, W, Th, F 8am or 1:30pm, Tues 9a or 1:30 - first come/first served. °Services: °Simple extractions, temporary fillings, uncomplicated abscess drainage.  You do not need to be an [place] resident. °Payment Options: °PAYMENT IS DUE AT THE TIME OF SERVICE. °Dental insurance, otherwise sliding scale - bring proof of income or support. °Depending on income and treatment needed, cost is usually $50-200. °Best way to get seen: °Arrive early as it is first come/first served. °  °  °[facility] °[phone] °  °Location: °[address] °Clinic Hours: °Mon-Thu 8a-5p °Services: °Most basic dental services including extractions and fillings. °Payment Options: °PAYMENT IS DUE AT THE TIME OF SERVICE. °Sliding scale, up to 50% off - bring proof if income or support. °Medicaid with dental option accepted. °Best way to get seen: °Call to schedule an appointment, can usually be seen within 2 weeks OR they will try to see walk-ins - show up at 8a or 2p (you may have to wait). °  °  °[facility] °[phone] °[place] RESIDENTS ONLY °  °Location: °[facility], [address], [place], [place] [address] °Clinic Hours: By appointment only. °Monday - Thursday 8am-5pm, Friday 8am-12pm °Services: Cleanings, fillings, extractions. °Payment Options: °PAYMENT IS DUE AT THE TIME OF SERVICE. °Cash, Visa or MasterCard. Sliding scale - $30 minimum per service. °Best way to get seen: °Come in to office, complete packet and make an appointment - need proof of income °or support monies for each household member and proof of [place] residence. °Usually takes about a month to get in. °  °  °[facility] °[phone] °  °Location: °[address]   [place] °Clinic Hours: Walk-in Urgent Care Dental Services are offered Monday-Friday  mornings only. °The numbers of emergencies accepted daily is limited to the number of °providers available. °Maximum 15 - Mondays, Wednesdays & Thursdays °Maximum 10 - Tuesdays & Fridays °Services: °You do not need to be a  [place] resident to be seen for a dental emergency. °Emergencies are defined as pain, swelling, abnormal bleeding, or dental trauma. Walkins will receive x-rays if needed. °NOTE: Dental cleaning is not an emergency. °Payment Options: °PAYMENT IS DUE AT THE TIME OF SERVICE. °Minimum co-pay is $40.00 for uninsured patients. °Minimum co-pay is $3.00 for Medicaid with dental coverage. °Dental Insurance is accepted and must be presented at time of visit. °Medicare does not cover dental. °Forms of payment: Cash, credit card, checks. °Best way to get seen: °If not previously registered with the clinic, walk-in dental registration begins at 7:15 am and is on a first come/first serve basis. °If previously registered with the clinic, call to make an appointment. °  °  °[facility] °[phone] °[place] RESIDENTS ONLY °  °Location: °[address], [place], [place] °Clinic Hours: °Mon-Thu 10a-2p °Services: Extractions only! °Payment Options: °FREE (donations accepted) - bring proof of income or support °Best way to get seen: °Call and schedule an appointment OR come at 8am on the 1st Monday of every month (except for holidays) when it is first come/first served. °  °  °[facility] °[phone] °  °Location: °[address], [place] °Clinic Hours: °Friday mornings °Services, Payment Options, Best way to get seen: °Call for info °

## 2017-10-15 NOTE — ED Provider Notes (Signed)
[facility] Emergency Department Provider Note  ____________________________________________  Time seen: Approximately 6:53 PM  I have reviewed the triage vital signs and the nursing notes.   HISTORY  Chief Complaint Dental Pain    HPI Azan Ginette OttoO Heumann is a 21 y.o. male presents to the emergency department with 10 out of 10 left-sided upper jaw pain for the past 3 days.  Patient does not currently have an appointment with a local dentist.  Patient describes pain as a throbbing sensation.  No alleviating measures have been attempted.   Past Medical History:  Diagnosis Date  . Asthma   . Bronchospasm     Patient Active Problem List   Diagnosis Date Noted  . Asthma with status asthmaticus 06/04/2016  . Acute hypokalemia 06/04/2016  . Noncompliance with medication treatment due to underuse of medication 06/04/2016  . Acute renal insufficiency 06/04/2016  . Acute asthma exacerbation 06/04/2016  . Dyspnea 03/16/2016  . Asthma exacerbation 03/16/2016  . Status asthmaticus 02/11/2014  . Acute respiratory failure (HCC) 02/11/2014    Past Surgical History:  Procedure Laterality Date  . NO PAST SURGERIES      Prior to Admission medications   Medication Sig Start Date End Date Taking? Authorizing Provider  albuterol (PROVENTIL HFA;VENTOLIN HFA) 108 (90 Base) MCG/ACT inhaler Inhale 2 puffs into the lungs every 4 (four) hours as needed for wheezing or shortness of breath. 02/23/17   Cuthriell, Delorise RoyalsJonathan D, PA-C  albuterol (PROVENTIL HFA;VENTOLIN HFA) 108 (90 Base) MCG/ACT inhaler Inhale 2 puffs into the lungs every 6 (six) hours as needed for wheezing or shortness of breath. 08/23/17   Emily FilbertWilliams, Jonathan E, MD  albuterol (PROVENTIL) (2.5 MG/3ML) 0.083% nebulizer solution Take 2.5 mg by nebulization every 6 (six) hours as needed for wheezing or shortness of breath.    [provider]  amoxicillin (AMOXIL) 500 MG tablet Take 1 tablet (500 mg total) by mouth  3 (three) times daily for 10 days. 10/15/17 10/25/17  Orvil FeilWoods, Bryahna Lesko M, PA-C  brompheniramine-pseudoephedrine-DM 30-2-10 MG/5ML syrup Take 10 mLs by mouth 4 (four) times daily as needed. Patient not taking: Reported on 08/23/2017 02/23/17   Cuthriell, Delorise RoyalsJonathan D, PA-C  chlorpheniramine-HYDROcodone (TUSSIONEX PENNKINETIC ER) 10-8 MG/5ML SUER Take 5 mLs by mouth 2 (two) times daily. 08/23/17   Emily FilbertWilliams, Jonathan E, MD  ibuprofen (ADVIL,MOTRIN) 800 MG tablet Take 1 tablet (800 mg total) by mouth every 8 (eight) hours as needed. Patient not taking: Reported on 08/23/2017 05/27/17   Evon SlackGaines, Thomas C, PA-C  ketorolac (TORADOL) 10 MG tablet Take 1 tablet (10 mg total) by mouth every 6 (six) hours as needed for up to 5 days. 10/15/17 10/20/17  Orvil FeilWoods, Seona Clemenson M, PA-C  predniSONE (DELTASONE) 50 MG tablet Take 1 tablet (50 mg total) by mouth daily with breakfast. Patient not taking: Reported on 08/23/2017 02/23/17   Cuthriell, Delorise RoyalsJonathan D, PA-C  predniSONE (STERAPRED UNI-PAK 21 TAB) 10 MG (21) TBPK tablet Take by mouth daily. 08/23/17   Emily FilbertWilliams, Jonathan E, MD    Allergies Patient has no known allergies.  Family History  Problem Relation Age of Onset  . Arthritis Paternal Grandmother     Social History Social History   Tobacco Use  . Smoking status: Never Smoker  . Smokeless tobacco: Never Used  Substance Use Topics  . Alcohol use: No  . Drug use: No    Comment: Hx of smoking marajauna, last used over a year ago     Review of Systems  Constitutional: No fever/chills  Eyes: No visual changes. No discharge ENT: Patient has dental pain.  Cardiovascular: no chest pain. Respiratory: no cough. No SOB. Skin: Negative for rash, abrasions, lacerations, ecchymosis. Neurological: Negative for headaches, focal weakness or numbness.  ____________________________________________   PHYSICAL EXAM:  VITAL SIGNS: ED Triage Vitals  Enc Vitals Group     BP 10/15/17 1813 135/77     Pulse Rate 10/15/17 1813 73      Resp 10/15/17 1813 16     Temp 10/15/17 1813 98.5 F (36.9 C)     Temp Source 10/15/17 1813 Oral     SpO2 10/15/17 1813 98 %     Weight 10/15/17 1810 180 lb (81.6 kg)     Height 10/15/17 1810 5\' 10"  (1.778 m)     Head Circumference --      Peak Flow --      Pain Score 10/15/17 1809 7     Pain Loc --      Pain Edu? --      Excl. in GC? --      Constitutional: Alert and oriented. Well appearing and in no acute distress. Eyes: Conjunctivae are normal. PERRL. EOMI. Head: Atraumatic. ENT:      Ears: TMs are pearly bilaterally.       Nose: No congestion/rhinnorhea.      Mouth/Throat: Mucous membranes are moist.  The patient has superior 16 pain, which is broken.  No other dental caries or broken teeth are visualized. Neck: No stridor. FROM.   Cardiovascular: Normal rate, regular rhythm. Normal S1 and S2.  Good peripheral circulation. Respiratory: Normal respiratory effort without tachypnea or retractions. Lungs CTAB. Good air entry to the bases with no decreased or absent breath sounds. Skin:  Skin is warm, dry and intact. No rash noted.  ____________________________________________   LABS (all labs ordered are listed, but only abnormal results are displayed)  Labs Reviewed - No data to display ____________________________________________  EKG   ____________________________________________  RADIOLOGY   No results found.  ____________________________________________    PROCEDURES  Procedure(s) performed:    Procedures    Medications  ketorolac (TORADOL) 30 MG/ML injection 30 mg (not administered)     ____________________________________________   INITIAL IMPRESSION / ASSESSMENT AND PLAN / ED COURSE  Pertinent labs & imaging results that were available during my care of the patient were reviewed by me and considered in my medical decision making (see chart for details).  Review of the [place] CSRS was performed in accordance of the [facility] prior to  dispensing any controlled drugs.     Assessment and plan Dental pain Patient presents to the emergency department with 10 out of 10 superior 16 pain.  On physical exam, superior 16 is broken.  Patient was given Toradol in the emergency department.  He was discharged with Toradol and amoxicillin.  Patient was advised to seek care with a local dentist as soon as possible.  Dental resources were provided in patient's discharge paperwork.  Vital signs were reassuring prior to discharge.  Patient was observed sleeping during emergency department encounter.    ____________________________________________  FINAL CLINICAL IMPRESSION(S) / ED DIAGNOSES  Final diagnoses:  Pain, dental      NEW MEDICATIONS STARTED DURING THIS VISIT:  ED Discharge Orders        Ordered    ketorolac (TORADOL) 10 MG tablet  Every 6 hours PRN     10/15/17 1849    amoxicillin (AMOXIL) 500 MG tablet  3 times daily     10/15/17  1851          This chart was dictated using voice recognition software/Dragon. Despite best efforts to proofread, errors can occur which can change the meaning. Any change was purely unintentional.    Orvil FeilWoods, Caydn Justen M, PA-C 10/15/17 1858    Willy Eddyobinson, Patrick, MD 10/15/17 1911

## 2017-10-15 NOTE — ED Triage Notes (Signed)
C/O left upper jaw dental pain x 3 days. 

## 2017-10-21 ENCOUNTER — Emergency Department (HOSPITAL_COMMUNITY)
Admission: EM | Admit: 2017-10-21 | Discharge: 2017-10-21 | Disposition: A | Discharge status: Home / Self Care | Payer: Medicaid Other | Attending: Emergency Medicine | Admitting: Emergency Medicine

## 2017-10-21 ENCOUNTER — Encounter (HOSPITAL_COMMUNITY): Payer: Self-pay | Admitting: Emergency Medicine

## 2017-10-21 DIAGNOSIS — K029 Dental caries, unspecified: Secondary | ICD-10-CM | POA: Insufficient documentation

## 2017-10-21 DIAGNOSIS — Z79899 Other long term (current) drug therapy: Secondary | ICD-10-CM | POA: Insufficient documentation

## 2017-10-21 DIAGNOSIS — J45909 Unspecified asthma, uncomplicated: Secondary | ICD-10-CM | POA: Insufficient documentation

## 2017-10-21 MED ORDER — TRAMADOL HCL 50 MG PO TABS
50.0000 mg | ORAL_TABLET | Freq: Once | ORAL | Status: AC
  Administered 2017-10-21: 50 mg via ORAL
  Filled 2017-10-21: qty 1

## 2017-10-21 NOTE — ED Notes (Signed)
Lolli-cane applied to patient broken tooth, per verbal order.

## 2017-10-21 NOTE — ED Provider Notes (Signed)
[facility]-EMERGENCY DEPT Provider Note   CSN: [id number] Arrival date & time: 10/21/17  1139     History   Chief Complaint No chief complaint on file.   HPI John Schwartz is a 21 y.o. male who presents to the ED via EMS for dental pain. Patient reports left upper dental pain x 4 days with pain radiating from the tooth to her head causing headache this morning. No fever, n/v or other problems. Patient [facility] and treated with Toradol and Amoxicillin. Patient states medication is not working.  HPI  Past Medical History:  Diagnosis Date  . Asthma   . Bronchospasm     Patient Active Problem List   Diagnosis Date Noted  . Asthma with status asthmaticus 06/04/2016  . Acute hypokalemia 06/04/2016  . Noncompliance with medication treatment due to underuse of medication 06/04/2016  . Acute renal insufficiency 06/04/2016  . Acute asthma exacerbation 06/04/2016  . Dyspnea 03/16/2016  . Asthma exacerbation 03/16/2016  . Status asthmaticus 02/11/2014  . Acute respiratory failure (HCC) 02/11/2014    Past Surgical History:  Procedure Laterality Date  . NO PAST SURGERIES         Home Medications    Prior to Admission medications   Medication Sig Start Date End Date Taking? Authorizing Provider  albuterol (PROVENTIL HFA;VENTOLIN HFA) 108 (90 Base) MCG/ACT inhaler Inhale 2 puffs into the lungs every 4 (four) hours as needed for wheezing or shortness of breath. 02/23/17   Cuthriell, Delorise RoyalsJonathan D, PA-C  albuterol (PROVENTIL HFA;VENTOLIN HFA) 108 (90 Base) MCG/ACT inhaler Inhale 2 puffs into the lungs every 6 (six) hours as needed for wheezing or shortness of breath. 08/23/17   Emily FilbertWilliams, Jonathan E, MD  albuterol (PROVENTIL) (2.5 MG/3ML) 0.083% nebulizer solution Take 2.5 mg by nebulization every 6 (six) hours as needed for wheezing or shortness of breath.    [provider]  amoxicillin (AMOXIL) 500 MG tablet Take 1 tablet  (500 mg total) by mouth 3 (three) times daily for 10 days. 10/15/17 10/25/17  Orvil FeilWoods, Jaclyn M, PA-C  brompheniramine-pseudoephedrine-DM 30-2-10 MG/5ML syrup Take 10 mLs by mouth 4 (four) times daily as needed. Patient not taking: Reported on 08/23/2017 02/23/17   Cuthriell, Delorise RoyalsJonathan D, PA-C  chlorpheniramine-HYDROcodone (TUSSIONEX PENNKINETIC ER) 10-8 MG/5ML SUER Take 5 mLs by mouth 2 (two) times daily. 08/23/17   Emily FilbertWilliams, Jonathan E, MD  ibuprofen (ADVIL,MOTRIN) 800 MG tablet Take 1 tablet (800 mg total) by mouth every 8 (eight) hours as needed. Patient not taking: Reported on 08/23/2017 05/27/17   Evon SlackGaines, Thomas C, PA-C  predniSONE (DELTASONE) 50 MG tablet Take 1 tablet (50 mg total) by mouth daily with breakfast. Patient not taking: Reported on 08/23/2017 02/23/17   Cuthriell, Delorise RoyalsJonathan D, PA-C  predniSONE (STERAPRED UNI-PAK 21 TAB) 10 MG (21) TBPK tablet Take by mouth daily. 08/23/17   Emily FilbertWilliams, Jonathan E, MD    Family History Family History  Problem Relation Age of Onset  . Arthritis Paternal Grandmother     Social History Social History   Tobacco Use  . Smoking status: Never Smoker  . Smokeless tobacco: Never Used  Substance Use Topics  . Alcohol use: No  . Drug use: No    Comment: Hx of smoking marajauna, last used over a year ago     Allergies   Patient has no known allergies.   Review of Systems Review of Systems  Constitutional: Negative for chills and fever.  HENT: Positive for dental problem.  Eyes: Negative for pain, itching and visual disturbance.  Respiratory: Negative for cough and shortness of breath.   Cardiovascular: Negative for chest pain.  Gastrointestinal: Negative for abdominal pain, nausea and vomiting.  Skin: Negative for rash.  Neurological: Positive for headaches (left side ). Negative for syncope.  Psychiatric/Behavioral: Negative for confusion.     Physical Exam Updated Vital Signs BP (!) 147/87 (BP Location: Left Arm)   Pulse 71   Temp 98.4  F (36.9 C) (Oral)   Resp 18   SpO2 100%   Physical Exam  Constitutional: He is oriented to person, place, and time. He appears well-developed and well-nourished. No distress.  HENT:  Head: Normocephalic.  Right Ear: Tympanic membrane normal.  Left Ear: Tympanic membrane normal.  Nose: Nose normal.  Mouth/Throat: Uvula is midline and oropharynx is clear and moist. Dental caries present.    The left upper first molar is broken and decayed. There is swelling of the gum surrounding the tooth. The patient c/o pain with palpation of the tooth and gum. There is no abscess noted.  Eyes: EOM are normal.  Neck: Neck supple.  Cardiovascular: Normal rate.  Pulmonary/Chest: Effort normal.  Musculoskeletal: Normal range of motion.  Neurological: He is alert and oriented to person, place, and time. No cranial nerve deficit.  Skin: Skin is warm and dry.  Psychiatric: He has a normal mood and affect.  Nursing note and vitals reviewed.    ED Treatments / Results  Labs (all labs ordered are listed, but only abnormal results are displayed) Labs Reviewed - No data to display Radiology No results found.  Procedures Procedures (including critical care time)  Medications Ordered in ED Medications  traMADol (ULTRAM) tablet 50 mg (50 mg Oral Given 10/21/17 1321)     Initial Impression / Assessment and Plan / ED Course  I have reviewed the triage vital signs and the nursing notes. Patient with toothache.  No gross abscess.  Exam unconcerning for Ludwig's angina or spread of infection.  Patient to continue antibiotics prescribed yesterday and medication that was prescribed for pain. Referral to dentist on call given and encouraged patient to call today to schedule f/u.     Final Clinical Impressions(s) / ED Diagnoses   Final diagnoses:  Pain due to dental caries    ED Discharge Orders    None       Kerrie Buffaloeese, Faiz Weber FidelityM, [place]NP 10/21/17 1426    Loren RacerYelverton, David, MD 10/26/17 [phone]

## 2017-10-21 NOTE — ED Notes (Signed)
Pt given information about the charging stations in the lobby.

## 2017-10-21 NOTE — Discharge Instructions (Signed)
Continue to take the medication you were prescribed yesterday. Call the dentist today to schedule follow up.

## 2017-10-21 NOTE — ED Triage Notes (Signed)
Per EMS, patient from work, c/o left upper toothache x3 days with pain radiating from tooth causing headache since this morning. Denies fevers.

## 2018-02-26 ENCOUNTER — Encounter: Payer: Self-pay | Admitting: Emergency Medicine

## 2018-02-26 ENCOUNTER — Other Ambulatory Visit: Payer: Self-pay

## 2018-02-26 ENCOUNTER — Emergency Department
Admission: EM | Admit: 2018-02-26 | Discharge: 2018-02-26 | Disposition: A | Discharge status: Home / Self Care | Payer: Self-pay | Attending: Emergency Medicine | Admitting: Emergency Medicine

## 2018-02-26 DIAGNOSIS — J45909 Unspecified asthma, uncomplicated: Secondary | ICD-10-CM | POA: Insufficient documentation

## 2018-02-26 DIAGNOSIS — Z5321 Procedure and treatment not carried out due to patient leaving prior to being seen by health care provider: Secondary | ICD-10-CM | POA: Insufficient documentation

## 2018-02-26 MED ORDER — ALBUTEROL SULFATE (2.5 MG/3ML) 0.083% IN NEBU
5.0000 mg | INHALATION_SOLUTION | Freq: Once | RESPIRATORY_TRACT | Status: AC
  Administered 2018-02-26: 5 mg via RESPIRATORY_TRACT

## 2018-02-26 MED ORDER — ALBUTEROL SULFATE (2.5 MG/3ML) 0.083% IN NEBU
INHALATION_SOLUTION | RESPIRATORY_TRACT | Status: AC
  Filled 2018-02-26: qty 3

## 2018-02-26 NOTE — ED Notes (Signed)
Patient up to stat desk asking how to get up to L&D.  Explained to patient that he would be going to a treatment room shortly and patient stated he needed to check on his sister who was in labor.

## 2018-02-26 NOTE — ED Triage Notes (Signed)
Pt arrives ambulatory to triage with c/o asthma attack since yesterday evening. Pt reports that his nebulizer is broken at home. Pt is in NAD.

## 2018-02-27 ENCOUNTER — Other Ambulatory Visit: Payer: Self-pay

## 2018-02-27 ENCOUNTER — Emergency Department: Payer: Medicaid Other

## 2018-02-27 ENCOUNTER — Emergency Department
Admission: EM | Admit: 2018-02-27 | Discharge: 2018-02-27 | Disposition: A | Discharge status: Home / Self Care | Payer: Medicaid Other | Attending: Emergency Medicine | Admitting: Emergency Medicine

## 2018-02-27 ENCOUNTER — Encounter: Payer: Self-pay | Admitting: Emergency Medicine

## 2018-02-27 DIAGNOSIS — R0602 Shortness of breath: Secondary | ICD-10-CM | POA: Insufficient documentation

## 2018-02-27 DIAGNOSIS — R079 Chest pain, unspecified: Secondary | ICD-10-CM | POA: Insufficient documentation

## 2018-02-27 DIAGNOSIS — Z79899 Other long term (current) drug therapy: Secondary | ICD-10-CM | POA: Insufficient documentation

## 2018-02-27 DIAGNOSIS — J45901 Unspecified asthma with (acute) exacerbation: Secondary | ICD-10-CM | POA: Insufficient documentation

## 2018-02-27 LAB — CBC
HEMATOCRIT: 42.9 % (ref 40.0–52.0)
Hemoglobin: 14.4 g/dL (ref 13.0–18.0)
MCH: 27.6 pg (ref 26.0–34.0)
MCHC: 33.5 g/dL (ref 32.0–36.0)
MCV: 82.5 fL (ref 80.0–100.0)
Platelets: 271 10*3/uL (ref 150–440)
RBC: 5.2 MIL/uL (ref 4.40–5.90)
RDW: 12.9 % (ref 11.5–14.5)
WBC: 6.9 10*3/uL (ref 3.8–10.6)

## 2018-02-27 LAB — BASIC METABOLIC PANEL
ANION GAP: 6 (ref 5–15)
BUN: 15 mg/dL (ref 6–20)
CO2: 27 mmol/L (ref 22–32)
Calcium: 9.2 mg/dL (ref 8.9–10.3)
Chloride: 103 mmol/L (ref 101–111)
Creatinine, Ser: 1.07 mg/dL (ref 0.61–1.24)
GFR calc Af Amer: >60 mL/min (ref >60)
Glucose, Bld: 107 mg/dL — ABNORMAL HIGH (ref 65–99)
POTASSIUM: 3.7 mmol/L (ref 3.5–5.1)
SODIUM: 136 mmol/L (ref 135–145)

## 2018-02-27 LAB — TROPONIN I: Troponin I: <0.03 ng/mL (ref <0.03)

## 2018-02-27 MED ORDER — IPRATROPIUM-ALBUTEROL 0.5-2.5 (3) MG/3ML IN SOLN
3.0000 mL | Freq: Once | RESPIRATORY_TRACT | Status: AC
  Administered 2018-02-27: 3 mL via RESPIRATORY_TRACT
  Filled 2018-02-27: qty 3

## 2018-02-27 MED ORDER — ALBUTEROL SULFATE (2.5 MG/3ML) 0.083% IN NEBU
5.0000 mg | INHALATION_SOLUTION | Freq: Once | RESPIRATORY_TRACT | Status: AC
  Administered 2018-02-27: 5 mg via RESPIRATORY_TRACT
  Filled 2018-02-27: qty 6

## 2018-02-27 MED ORDER — PREDNISONE 20 MG PO TABS
60.0000 mg | ORAL_TABLET | Freq: Once | ORAL | Status: AC
  Administered 2018-02-27: 60 mg via ORAL
  Filled 2018-02-27: qty 3

## 2018-02-27 MED ORDER — PREDNISONE 10 MG (21) PO TBPK: ORAL_TABLET | Freq: Every day | ORAL | 0 refills | Status: DC

## 2018-02-27 MED ORDER — IPRATROPIUM-ALBUTEROL 0.5-2.5 (3) MG/3ML IN SOLN
3.0000 mL | Freq: Once | RESPIRATORY_TRACT | Status: AC
  Administered 2018-02-27: 3 mL via RESPIRATORY_TRACT

## 2018-02-27 MED ORDER — IPRATROPIUM-ALBUTEROL 0.5-2.5 (3) MG/3ML IN SOLN
RESPIRATORY_TRACT | Status: AC
  Filled 2018-02-27: qty 3

## 2018-02-27 NOTE — ED Notes (Signed)
Patient states he has been having trouble with his asthma over the past few days. Has used nebulizer and rescue inhalers but they have not helped. Thinks the pollen has been a trigger for the asthma flare. Denies a history of smoking and is not near people who smoke. Patient without difficulty breathing at this time. Able to speak in complete sentences without difficulty.

## 2018-02-27 NOTE — ED Provider Notes (Addendum)
[facility] Emergency Department Provider Note       Time seen: ----------------------------------------- 9:00 PM on 02/27/2018 -----------------------------------------   I have reviewed the triage vital signs and the nursing notes.  HISTORY   Chief Complaint Asthma and Chest Pain    HPI John Schwartz is a [age] y.o. male with a history of asthma who presents to the ED for an asthma exacerbation.  Patient states have been having trouble with his asthma for the past few days which he attributes to the pollen.  He is uses nebulizer and rescue inhalers but they have not helped.  He did denies a history of smoking, denies recent illness or other complaints.  Past Medical History:  Diagnosis Date  . Asthma   . Bronchospasm     Patient Active Problem List   Diagnosis Date Noted  . Asthma with status asthmaticus 06/04/2016  . Acute hypokalemia 06/04/2016  . Noncompliance with medication treatment due to underuse of medication 06/04/2016  . Acute renal insufficiency 06/04/2016  . Acute asthma exacerbation 06/04/2016  . Dyspnea 03/16/2016  . Asthma exacerbation 03/16/2016  . Status asthmaticus 02/11/2014  . Acute respiratory failure (HCC) 02/11/2014    Past Surgical History:  Procedure Laterality Date  . NO PAST SURGERIES      Allergies Patient has no known allergies.  Social History Social History   Tobacco Use  . Smoking status: Never Smoker  . Smokeless tobacco: Never Used  Substance Use Topics  . Alcohol use: No  . Drug use: No    Comment: Hx of smoking marajauna, last used over a year ago   Review of Systems Constitutional: Negative for fever. Cardiovascular: Negative for chest pain. Respiratory: Positive for shortness of breath and wheezing Gastrointestinal: Negative for abdominal pain, vomiting and diarrhea. Genitourinary: Negative for dysuria. Musculoskeletal: Negative for back pain. Skin: Negative for rash. Neurological:  Negative for headaches, focal weakness or numbness.  All systems negative/normal/unremarkable except as stated in the HPI  ____________________________________________   PHYSICAL EXAM:  VITAL SIGNS: ED Triage Vitals  Enc Vitals Group     BP 02/27/18 2025 136/80     Pulse Rate 02/27/18 2025 82     Resp 02/27/18 2025 20     Temp 02/27/18 2025 98.2 F (36.8 C)     Temp Source 02/27/18 2025 Oral     SpO2 02/27/18 2025 96 %     Weight 02/27/18 2021 165 lb (74.8 kg)     Height 02/27/18 2021 5\' 10"  (1.778 m)     Head Circumference --      Peak Flow --      Pain Score 02/27/18 2022 6     Pain Loc --      Pain Edu? --      Excl. in GC? --    Constitutional: Alert and oriented. Well appearing and in no distress. Eyes: Conjunctivae are normal. Normal extraocular movements. Cardiovascular: Normal rate, regular rhythm. No murmurs, rubs, or gallops. Respiratory: Normal respiratory effort without tachypnea nor retractions.  Mild expiratory wheezing is noted Gastrointestinal: Soft and nontender. Normal bowel sounds Musculoskeletal: Nontender with normal range of motion in extremities. No lower extremity tenderness nor edema. Neurologic:  Normal speech and language. No gross focal neurologic deficits are appreciated.  Skin:  Skin is warm, dry and intact. No rash noted. ___________________________________________  ED COURSE:  As part of my medical decision making, I reviewed the following data within the electronic MEDICAL RECORD NUMBER History obtained from family if  available, nursing notes, old chart and ekg, as well as notes from prior ED visits. Patient presented for asthma exacerbation, we will assess with labs and imaging as indicated at this time.   Procedures ____________________________________________   LABS (pertinent positives/negatives)  Labs Reviewed  BASIC METABOLIC PANEL - Abnormal; Notable for the following components:      Result Value   Glucose, Bld 107 (*)    All  other components within normal limits  CBC  TROPONIN I   EKG: Sinus rhythm with sinus arrhythmia, rate is 78 bpm, normal PR interval, normal QRS, normal QT  RADIOLOGY Images were viewed by me  Chest x-ray is normal  ____________________________________________  DIFFERENTIAL DIAGNOSIS   Asthma, pneumonia, seasonal allergy  FINAL ASSESSMENT AND PLAN  Asthma exacerbation   Plan: The patient had presented for asthma exacerbation. Patient's labs are unremarkable. Patient's imaging is also unremarkable.  He was given duo nebs and steroids here and is stable for outpatient follow-up.   Ulice Dash, MD   Note: This note was generated in part or whole with voice recognition software. Voice recognition is usually quite accurate but there are transcription errors that can and very often do occur. I apologize for any typographical errors that were not detected and corrected.     Emily Filbert, MD 02/27/18 2112    Emily Filbert, MD 03/10/18 [phone]

## 2018-02-27 NOTE — ED Triage Notes (Signed)
Pt arrives ambulatory to triage with c/o chest pain and asthma flair which started last night. Pt was seen here for the same 02/26/2018 but denied chest pain at that time. Pt has diminished lung sounds at this time in triage.

## 2018-03-04 ENCOUNTER — Other Ambulatory Visit: Payer: Self-pay

## 2018-03-04 ENCOUNTER — Inpatient Hospital Stay
Admission: EM | Admit: 2018-03-04 | Discharge: 2018-03-06 | DRG: 203 | Disposition: A | Discharge status: Home / Self Care | Payer: Self-pay | Attending: Internal Medicine | Admitting: Internal Medicine

## 2018-03-04 ENCOUNTER — Emergency Department: Payer: Self-pay

## 2018-03-04 DIAGNOSIS — J45901 Unspecified asthma with (acute) exacerbation: Secondary | ICD-10-CM

## 2018-03-04 DIAGNOSIS — J4541 Moderate persistent asthma with (acute) exacerbation: Principal | ICD-10-CM | POA: Diagnosis present

## 2018-03-04 DIAGNOSIS — Z77098 Contact with and (suspected) exposure to other hazardous, chiefly nonmedicinal, chemicals: Secondary | ICD-10-CM | POA: Diagnosis present

## 2018-03-04 DIAGNOSIS — J4551 Severe persistent asthma with (acute) exacerbation: Secondary | ICD-10-CM | POA: Diagnosis present

## 2018-03-04 MED ORDER — METHYLPREDNISOLONE SODIUM SUCC 125 MG IJ SOLR
60.0000 mg | Freq: Four times a day (QID) | INTRAMUSCULAR | Status: DC
  Administered 2018-03-05 (×4): 60 mg via INTRAVENOUS
  Filled 2018-03-04 (×6): qty 2

## 2018-03-04 MED ORDER — ENOXAPARIN SODIUM 40 MG/0.4ML ~~LOC~~ SOLN
40.0000 mg | SUBCUTANEOUS | Status: DC
  Filled 2018-03-04: qty 0.4

## 2018-03-04 MED ORDER — IPRATROPIUM-ALBUTEROL 0.5-2.5 (3) MG/3ML IN SOLN
3.0000 mL | RESPIRATORY_TRACT | Status: DC
  Administered 2018-03-04 – 2018-03-06 (×7): 3 mL via RESPIRATORY_TRACT
  Filled 2018-03-04 (×7): qty 3

## 2018-03-04 MED ORDER — KETOROLAC TROMETHAMINE 30 MG/ML IJ SOLN
30.0000 mg | Freq: Four times a day (QID) | INTRAMUSCULAR | Status: DC | PRN
Start: 2018-03-04 — End: 2018-03-06

## 2018-03-04 MED ORDER — SODIUM CHLORIDE 0.9 % IV SOLN
INTRAVENOUS | Status: AC
  Administered 2018-03-04: 22:00:00 via INTRAVENOUS

## 2018-03-04 MED ORDER — ONDANSETRON HCL 4 MG/2ML IJ SOLN: 4.0000 mg | Freq: Four times a day (QID) | INTRAMUSCULAR | Status: DC | PRN

## 2018-03-04 MED ORDER — METHYLPREDNISOLONE SODIUM SUCC 125 MG IJ SOLR
125.0000 mg | Freq: Once | INTRAMUSCULAR | Status: AC
  Administered 2018-03-04: 125 mg via INTRAVENOUS
  Filled 2018-03-04: qty 2

## 2018-03-04 MED ORDER — ACETAMINOPHEN 650 MG RE SUPP: 650.0000 mg | Freq: Four times a day (QID) | RECTAL | Status: DC | PRN

## 2018-03-04 MED ORDER — ACETAMINOPHEN 325 MG PO TABS
650.0000 mg | ORAL_TABLET | Freq: Four times a day (QID) | ORAL | Status: DC | PRN
Start: 2018-03-04 — End: 2018-03-06

## 2018-03-04 MED ORDER — ALBUTEROL SULFATE (2.5 MG/3ML) 0.083% IN NEBU
5.0000 mg | INHALATION_SOLUTION | Freq: Once | RESPIRATORY_TRACT | Status: AC
  Administered 2018-03-04: 5 mg via RESPIRATORY_TRACT
  Filled 2018-03-04: qty 6

## 2018-03-04 MED ORDER — IPRATROPIUM-ALBUTEROL 0.5-2.5 (3) MG/3ML IN SOLN
3.0000 mL | Freq: Once | RESPIRATORY_TRACT | Status: AC
  Administered 2018-03-04: 3 mL via RESPIRATORY_TRACT
  Filled 2018-03-04: qty 3

## 2018-03-04 MED ORDER — ONDANSETRON HCL 4 MG PO TABS: 4.0000 mg | ORAL_TABLET | Freq: Four times a day (QID) | ORAL | Status: DC | PRN

## 2018-03-04 MED ORDER — TRAMADOL HCL 50 MG PO TABS
50.0000 mg | ORAL_TABLET | Freq: Four times a day (QID) | ORAL | Status: DC | PRN
  Administered 2018-03-04: 50 mg via ORAL
  Filled 2018-03-04: qty 1

## 2018-03-04 MED ORDER — MAGNESIUM SULFATE 2 GM/50ML IV SOLN
2.0000 g | Freq: Once | INTRAVENOUS | Status: AC
  Administered 2018-03-04: 2 g via INTRAVENOUS
  Filled 2018-03-04: qty 50

## 2018-03-04 NOTE — ED Provider Notes (Signed)
[facility] Emergency Department Provider Note   ____________________________________________   First MD Initiated Contact with Patient 03/04/18 1755     (approximate)  I have reviewed the triage vital signs and the nursing notes.   HISTORY  Chief Complaint Shortness of Breath   HPI John Schwartz is a 22 y.o. male who works at [employer].  He was on his first day at work and accidentally inhaled cleaning chemicals at work.  There was some kind of ascitic pain.  He was not mixing chemicals.  He got 1 DuoNeb there and one in EMS he has had to hear he is now talking better but still wheezing a lot.  He has a history of asthma and bronchospasm.  He was okay before he inhaled the material.   Past Medical History:  Diagnosis Date  . Asthma   . Bronchospasm     Patient Active Problem List   Diagnosis Date Noted  . Asthma with status asthmaticus 06/04/2016  . Acute hypokalemia 06/04/2016  . Noncompliance with medication treatment due to underuse of medication 06/04/2016  . Acute renal insufficiency 06/04/2016  . Acute asthma exacerbation 06/04/2016  . Dyspnea 03/16/2016  . Asthma exacerbation 03/16/2016  . Status asthmaticus 02/11/2014  . Acute respiratory failure (HCC) 02/11/2014    Past Surgical History:  Procedure Laterality Date  . NO PAST SURGERIES      Prior to Admission medications   Medication Sig Start Date End Date Taking? Authorizing Provider  albuterol (PROVENTIL HFA;VENTOLIN HFA) 108 (90 Base) MCG/ACT inhaler Inhale 2 puffs into the lungs every 4 (four) hours as needed for wheezing or shortness of breath. 02/23/17   Cuthriell, Delorise Royals, PA-C  albuterol (PROVENTIL HFA;VENTOLIN HFA) 108 (90 Base) MCG/ACT inhaler Inhale 2 puffs into the lungs every 6 (six) hours as needed for wheezing or shortness of breath. 08/23/17   Emily Filbert, MD  albuterol (PROVENTIL) (2.5 MG/3ML) 0.083% nebulizer solution Take 2.5 mg by  nebulization every 6 (six) hours as needed for wheezing or shortness of breath.    [provider]  brompheniramine-pseudoephedrine-DM 30-2-10 MG/5ML syrup Take 10 mLs by mouth 4 (four) times daily as needed. Patient not taking: Reported on 08/23/2017 02/23/17   Cuthriell, Delorise Royals, PA-C  chlorpheniramine-HYDROcodone (TUSSIONEX PENNKINETIC ER) 10-8 MG/5ML SUER Take 5 mLs by mouth 2 (two) times daily. 08/23/17   Emily Filbert, MD  ibuprofen (ADVIL,MOTRIN) 800 MG tablet Take 1 tablet (800 mg total) by mouth every 8 (eight) hours as needed. Patient not taking: Reported on 08/23/2017 05/27/17   Evon Slack, PA-C  predniSONE (DELTASONE) 50 MG tablet Take 1 tablet (50 mg total) by mouth daily with breakfast. Patient not taking: Reported on 08/23/2017 02/23/17   Cuthriell, Delorise Royals, PA-C  predniSONE (STERAPRED UNI-PAK 21 TAB) 10 MG (21) TBPK tablet Take by mouth daily. 08/23/17   Emily Filbert, MD  predniSONE (STERAPRED UNI-PAK 21 TAB) 10 MG (21) TBPK tablet Take by mouth daily. Dispense steroid taper pack as directed 02/27/18   Emily Filbert, MD    Allergies Patient has no known allergies.  Family History  Problem Relation Age of Onset  . Arthritis Paternal Grandmother     Social History Social History   Tobacco Use  . Smoking status: Never Smoker  . Smokeless tobacco: Never Used  Substance Use Topics  . Alcohol use: No  . Drug use: No    Comment: Hx of smoking marajauna, last used over a year ago  Review of Systems  Constitutional: No fever/chills Eyes: No visual changes. ENT: No sore throat. Cardiovascular: Denies chest pain. Respiratory:  shortness of breath. Gastrointestinal: No abdominal pain.  No nausea, no vomiting.  No diarrhea.  No constipation. Genitourinary: Negative for dysuria. Musculoskeletal: Negative for back pain. Skin: Negative for rash. Neurological: Negative for headaches, focal weakness    ____________________________________________   PHYSICAL EXAM:  VITAL SIGNS: ED Triage Vitals  Enc Vitals Group     BP 03/04/18 1800 (!) 129/101     Pulse Rate 03/04/18 1800 88     Resp --      Temp 03/04/18 1800 98.3 F (36.8 C)     Temp src --      SpO2 03/04/18 1800 100 %     Weight 03/04/18 1748 165 lb (74.8 kg)     Height --      Head Circumference --      Peak Flow --      Pain Score 03/04/18 1748 7     Pain Loc --      Pain Edu? --      Excl. in GC? --     Constitutional: Alert and oriented. Well appearing and in no acute distress. Eyes: Conjunctivae are normal.  Head: Atraumatic. Nose: No congestion/rhinnorhea. Mouth/Throat: Mucous membranes are moist.  Oropharynx non-erythematous. Neck: No stridor.   Cardiovascular: Normal rate, regular rhythm. Grossly normal heart sounds.  Good peripheral circulation. Respiratory: Normal respiratory effort.  No retractions. Lungs initially very tight with some wheezing later not as tight but still wheezing Gastrointestinal: Soft and nontender. No distention. No abdominal bruits. No CVA tenderness. Musculoskeletal: No lower extremity tenderness nor edema.  No joint effusions. Neurologic:  Normal speech and language. No gross focal neurologic deficits are appreciated. No gait instability. Skin:  Skin is warm, dry and intact. No rash noted. Psychiatric: Mood and affect are normal. Speech and behavior are normal.  ____________________________________________   LABS (all labs ordered are listed, but only abnormal results are displayed)  Labs Reviewed - No data to display ____________________________________________  EKG   ____________________________________________  RADIOLOGY  ED MD interpretation: Chest x-ray reviewed by me I agree with radiology's normal chest x-ray  Official radiology report(s): Dg Chest 2 View  Result Date: 03/04/2018 CLINICAL DATA:  Shortness of breath EXAM: CHEST - 2 VIEW COMPARISON:  02/27/2018  FINDINGS: The heart size and mediastinal contours are within normal limits. Both lungs are clear. The visualized skeletal structures are unremarkable. IMPRESSION: Normal chest. Electronically Signed   By: Deatra RobinsonKevin  Herman M.D.   On: 03/04/2018 18:49    ____________________________________________   PROCEDURES  Procedure(s) performed:  Procedures  Critical Ca  ____________________________________________   INITIAL IMPRESSION / ASSESSMENT AND PLAN / ED COURSE  Patient is now had Solu-Medrol magnesium and 4 duo nebs.  I will admit him as he is still very wheezy and tight even though he has improved somewhat         ____________________________________________   FINAL CLINICAL IMPRESSION(S) / ED DIAGNOSES  Final diagnoses:  Exacerbation of asthma, unspecified asthma severity, unspecified whether persistent     ED Discharge Orders    None       Note:  This document was prepared using Dragon voice recognition software and may include unintentional dictation errors.    Arnaldo NatalMalinda, Lyndel Dancel F, MD 03/04/18 [phone]

## 2018-03-04 NOTE — Progress Notes (Signed)
Spoke to poison control. We are unsure what the patient inhaled.  Unable to determine chemical from ED notes.  Poison Control recommends supportive care at this time.  We may call back with questions at any time.  If we do find out what the chemical is, call them .

## 2018-03-04 NOTE — ED Notes (Signed)
Called floor to give report to floor. Charge nurse is currently in Rapid response. This RN will call back.

## 2018-03-04 NOTE — H&P (Signed)
[facility] - [place] at [facility]   PATIENT NAME: John Schwartz    MR#:  [id number]  DATE OF BIRTH:  Feb 11, 1996  DATE OF ADMISSION:  03/04/2018  PRIMARY CARE PHYSICIAN: Patient, No Pcp Per   REQUESTING/REFERRING PHYSICIAN: Dr. Dorothea GlassmanPaul Malinda  CHIEF COMPLAINT:   Chief Complaint  Patient presents with  . Shortness of Breath    HISTORY OF PRESENT ILLNESS:  John Wonda OldsCousin  is a 22 y.o. male with a known history of moderate persistent asthma on inhalers at home, allergies presents to hospital secondary to worsening breathing and also wheezing today. Patient states he was admitted to the hospital last week for asthma exacerbation and was discharged on prednisone taper.  He still have some prednisone left over.  His asthma is very sensitive to irritants and allergens.  He went to his work today and was exposed to chemical inhalants.  Started feeling chest tightness as soon as he got exposed.  But throughout the day symptoms got worse to the point that he could not breathe and started having audible wheezing and so brought to the emergency room.  His saturations are within normal limits.  In spite of receiving multiple duo nebs, Solu-Medrol and magnesium, he still feels tight and has wheezing.  So he is being admitted for asthma exacerbation.  PAST MEDICAL HISTORY:   Past Medical History:  Diagnosis Date  . Asthma   . Bronchospasm     PAST SURGICAL HISTORY:   Past Surgical History:  Procedure Laterality Date  . NO PAST SURGERIES      SOCIAL HISTORY:   Social History   Tobacco Use  . Smoking status: Never Smoker  . Smokeless tobacco: Never Used  Substance Use Topics  . Alcohol use: No    FAMILY HISTORY:   Family History  Problem Relation Age of Onset  . Arthritis Paternal Grandmother     DRUG ALLERGIES:  No Known Allergies  REVIEW OF SYSTEMS:   Review of Systems  Constitutional: Negative for chills, fever, malaise/fatigue and weight loss.    HENT: Negative for ear discharge, ear pain, hearing loss and nosebleeds.   Eyes: Negative for blurred vision, double vision and photophobia.  Respiratory: Positive for cough, shortness of breath and wheezing. Negative for hemoptysis.   Cardiovascular: Negative for chest pain, palpitations, orthopnea and leg swelling.  Gastrointestinal: Negative for abdominal pain, constipation, diarrhea, heartburn, melena, nausea and vomiting.  Genitourinary: Negative for dysuria, frequency, hematuria and urgency.  Musculoskeletal: Negative for back pain, myalgias and neck pain.  Skin: Negative for rash.  Neurological: Negative for dizziness, tingling, tremors, sensory change, speech change, focal weakness and headaches.  Endo/Heme/Allergies: Does not bruise/bleed easily.  Psychiatric/Behavioral: Negative for depression.    MEDICATIONS AT HOME:   Prior to Admission medications   Medication Sig Start Date End Date Taking? Authorizing Provider  albuterol (PROVENTIL HFA;VENTOLIN HFA) 108 (90 Base) MCG/ACT inhaler Inhale 2 puffs into the lungs every 4 (four) hours as needed for wheezing or shortness of breath. 02/23/17  Yes Cuthriell, Delorise RoyalsJonathan D, PA-C  predniSONE (STERAPRED UNI-PAK 21 TAB) 10 MG (21) TBPK tablet Take by mouth daily. Dispense steroid taper pack as directed 02/27/18  Yes Emily FilbertWilliams, Jonathan E, MD  albuterol (PROVENTIL HFA;VENTOLIN HFA) 108 (90 Base) MCG/ACT inhaler Inhale 2 puffs into the lungs every 6 (six) hours as needed for wheezing or shortness of breath. Patient not taking: Reported on 03/04/2018 08/23/17   Emily FilbertWilliams, Jonathan E, MD  brompheniramine-pseudoephedrine-DM 30-2-10 MG/5ML syrup Take 10 mLs  by mouth 4 (four) times daily as needed. Patient not taking: Reported on 08/23/2017 02/23/17   Cuthriell, Delorise Royals, PA-C  chlorpheniramine-HYDROcodone (TUSSIONEX PENNKINETIC ER) 10-8 MG/5ML SUER Take 5 mLs by mouth 2 (two) times daily. Patient not taking: Reported on 03/04/2018 08/23/17   Emily Filbert, MD  ibuprofen (ADVIL,MOTRIN) 800 MG tablet Take 1 tablet (800 mg total) by mouth every 8 (eight) hours as needed. Patient not taking: Reported on 08/23/2017 05/27/17   Evon Slack, PA-C  predniSONE (DELTASONE) 50 MG tablet Take 1 tablet (50 mg total) by mouth daily with breakfast. Patient not taking: Reported on 08/23/2017 02/23/17   Cuthriell, Delorise Royals, PA-C  predniSONE (STERAPRED UNI-PAK 21 TAB) 10 MG (21) TBPK tablet Take by mouth daily. Patient not taking: Reported on 03/04/2018 08/23/17   Emily Filbert, MD      VITAL SIGNS:  Blood pressure 139/87, pulse 72, temperature 98.3 F (36.8 C), weight 74.8 kg (165 lb), SpO2 96 %.  PHYSICAL EXAMINATION:   Physical Exam  GENERAL:  22 y.o.-year-old patient lying in the bed with no acute distress.  EYES: Pupils equal, round, reactive to light and accommodation. No scleral icterus. Extraocular muscles intact.  HEENT: Head atraumatic, normocephalic. Oropharynx and nasopharynx clear.  NECK:  Supple, no jugular venous distention. No thyroid enlargement, no tenderness.  LUNGS: Tight in auscultation, diffuse expiratory wheezing all over the lung fields, no rales,rhonchi or crepitation. No use of accessory muscles of respiration.  CARDIOVASCULAR: S1, S2 normal. No murmurs, rubs, or gallops.  ABDOMEN: Soft, nontender, nondistended. Bowel sounds present. No organomegaly or mass.  EXTREMITIES: No pedal edema, cyanosis, or clubbing.  NEUROLOGIC: Cranial nerves II through XII are intact. Muscle strength 5/5 in all extremities. Sensation intact. Gait not checked.  PSYCHIATRIC: The patient is alert and oriented x 3.  SKIN: No obvious rash, lesion, or ulcer.   LABORATORY PANEL:   CBC Recent Labs  Lab 02/27/18 2030  WBC 6.9  HGB 14.4  HCT 42.9  PLT 271   ------------------------------------------------------------------------------------------------------------------  Chemistries  Recent Labs  Lab 02/27/18 2030  NA 136  K  3.7  CL 103  CO2 27  GLUCOSE 107*  BUN 15  CREATININE 1.07  CALCIUM 9.2   ------------------------------------------------------------------------------------------------------------------  Cardiac Enzymes Recent Labs  Lab 02/27/18 2030  TROPONINI <0.03   ------------------------------------------------------------------------------------------------------------------  RADIOLOGY:  Dg Chest 2 View  Result Date: 03/04/2018 CLINICAL DATA:  Shortness of breath EXAM: CHEST - 2 VIEW COMPARISON:  02/27/2018 FINDINGS: The heart size and mediastinal contours are within normal limits. Both lungs are clear. The visualized skeletal structures are unremarkable. IMPRESSION: Normal chest. Electronically Signed   By: Deatra Robinson M.D.   On: 03/04/2018 18:49    EKG:   Orders placed or performed during the hospital encounter of 02/27/18  . EKG 12-Lead  . EKG 12-Lead  . ED EKG within 10 minutes  . ED EKG within 10 minutes  . EKG    IMPRESSION AND PLAN:   John Schwartz  is a 22 y.o. male with a known history of moderate persistent asthma on inhalers at home, allergies presents to hospital secondary to worsening breathing and also wheezing today.  1.  Acute asthma exacerbation-triggered by exposure to chemical inhalants. -Admit, IV steroids, duo nebs and inhalers. -O2 supplement.  As needed -Avoid allergens and irritants. -Chest x-ray is clear otherwise.  2.  DVT prophylaxis-Lovenox    All the records are reviewed and case discussed with ED provider. Management plans discussed with the  patient, family and they are in agreement.  CODE STATUS: Full code  TOTAL TIME TAKING CARE OF THIS PATIENT: 50 minutes.    Enid Baas M.D on 03/04/2018 at 8:02 PM  Between 7am to 6pm - Pager - [phone]  After 6pm go to [email or web address] - password [email or web address]  [facility]  Office  [phone]  CC: Primary care physician; Patient, No Pcp Per

## 2018-03-04 NOTE — ED Triage Notes (Signed)
Pt c/o inhaling chemicals accidentally at work. States it triggered his asthma. Pt given albuterol and duoneb treatments in route.

## 2018-03-05 LAB — BASIC METABOLIC PANEL
Anion gap: 4 — ABNORMAL LOW (ref 5–15)
BUN: 10 mg/dL (ref 6–20)
CHLORIDE: 107 mmol/L (ref 101–111)
CO2: 26 mmol/L (ref 22–32)
CREATININE: 1.02 mg/dL (ref 0.61–1.24)
Calcium: 9.2 mg/dL (ref 8.9–10.3)
GFR calc Af Amer: >60 mL/min (ref >60)
GFR calc non Af Amer: >60 mL/min (ref >60)
Glucose, Bld: 152 mg/dL — ABNORMAL HIGH (ref 65–99)
Potassium: 4.3 mmol/L (ref 3.5–5.1)
Sodium: 137 mmol/L (ref 135–145)

## 2018-03-05 LAB — CBC
HCT: 43.6 % (ref 40.0–52.0)
Hemoglobin: 14.8 g/dL (ref 13.0–18.0)
MCH: 27.8 pg (ref 26.0–34.0)
MCHC: 33.8 g/dL (ref 32.0–36.0)
MCV: 82.3 fL (ref 80.0–100.0)
PLATELETS: 284 10*3/uL (ref 150–440)
RBC: 5.3 MIL/uL (ref 4.40–5.90)
RDW: 12.5 % (ref 11.5–14.5)
WBC: 9.7 10*3/uL (ref 3.8–10.6)

## 2018-03-05 NOTE — Progress Notes (Signed)
[facility] - [place] at [facility]   PATIENT NAME: John Schwartz    MR#:  [id number]  DATE OF BIRTH:  10-20-1996  SUBJECTIVE:  Patient seen and evaluated today Has shortness of breath and wheezing No chest pain  REVIEW OF SYSTEMS:    ROS  CONSTITUTIONAL: No documented fever. No fatigue, weakness. No weight gain, no weight loss.  EYES: No blurry or double vision.  ENT: No tinnitus. No postnasal drip. No redness of the oropharynx.  RESPIRATORY: Has occasional cough, has wheezing,  no hemoptysis. No dyspnea.  CARDIOVASCULAR: No chest pain. No orthopnea. No palpitations. No syncope.  GASTROINTESTINAL: No nausea, no vomiting or diarrhea. No abdominal pain. No melena or hematochezia.  GENITOURINARY: No dysuria or hematuria.  ENDOCRINE: No polyuria or nocturia. No heat or cold intolerance.  HEMATOLOGY: No anemia. No bruising. No bleeding.  INTEGUMENTARY: No rashes. No lesions.  MUSCULOSKELETAL: No arthritis. No swelling. No gout.  NEUROLOGIC: No numbness, tingling, or ataxia. No seizure-type activity.  PSYCHIATRIC: No anxiety. No insomnia. No ADD.   DRUG ALLERGIES:  No Known Allergies  VITALS:  Blood pressure 103/60, pulse 83, temperature 98.2 F (36.8 C), temperature source Oral, resp. rate 16, height 5\' 10"  (1.778 m), weight 78.1 kg (172 lb 1.6 oz), SpO2 96 %.  PHYSICAL EXAMINATION:   Physical Exam  GENERAL:  22 y.o.-year-old patient lying in the bed with no acute distress.  EYES: Pupils equal, round, reactive to light and accommodation. No scleral icterus. Extraocular muscles intact.  HEENT: Head atraumatic, normocephalic. Oropharynx and nasopharynx clear.  NECK:  Supple, no jugular venous distention. No thyroid enlargement, no tenderness.  LUNGS: Decreased breath sounds bilaterally, bilateral wheezing. No use of accessory muscles of respiration.  CARDIOVASCULAR: S1, S2 normal. No murmurs, rubs, or gallops.  ABDOMEN: Soft, nontender, nondistended. Bowel  sounds present. No organomegaly or mass.  EXTREMITIES: No cyanosis, clubbing or edema b/l.    NEUROLOGIC: Cranial nerves II through XII are intact. No focal Motor or sensory deficits b/l.   PSYCHIATRIC: The patient is alert and oriented x 3.  SKIN: No obvious rash, lesion, or ulcer.   LABORATORY PANEL:   CBC Recent Labs  Lab 03/05/18 0507  WBC 9.7  HGB 14.8  HCT 43.6  PLT 284   ------------------------------------------------------------------------------------------------------------------ Chemistries  Recent Labs  Lab 03/05/18 0507  NA 137  K 4.3  CL 107  CO2 26  GLUCOSE 152*  BUN 10  CREATININE 1.02  CALCIUM 9.2   ------------------------------------------------------------------------------------------------------------------  Cardiac Enzymes Recent Labs  Lab 02/27/18 2030  TROPONINI <0.03   ------------------------------------------------------------------------------------------------------------------  RADIOLOGY:  Dg Chest 2 View  Result Date: 03/04/2018 CLINICAL DATA:  Shortness of breath EXAM: CHEST - 2 VIEW COMPARISON:  02/27/2018 FINDINGS: The heart size and mediastinal contours are within normal limits. Both lungs are clear. The visualized skeletal structures are unremarkable. IMPRESSION: Normal chest. Electronically Signed   By: Deatra RobinsonKevin  Herman M.D.   On: 03/04/2018 18:49     ASSESSMENT AND PLAN:   22 year old male patient with history of bronchial asthma, allergies was admitted for worsening asthma exacerbation.  1.Acute bronchial asthma exacerbation improving slowly  continue IV Solu-Medrol Nebulization treatments Oxygen as needed  2.DVT prophylaxis with Lovenox subcutaneously  3.Avoid allergens and pollen  All the records are reviewed and case discussed with Care Management/Social Worker. Management plans discussed with the patient, family and they are in agreement.  CODE STATUS: Full code  DVT Prophylaxis: SCDs  TOTAL TIME TAKING CARE  OF THIS PATIENT: 35 minutes.  POSSIBLE D/C IN 1 DAYS, DEPENDING ON CLINICAL CONDITION.  [name] M.D on 03/05/2018 at 1:21 PM  Between 7am to 6pm - Pager - [phone]  After 6pm go to [email or web address] - password [email or web address]  [facility] [place] [facility]  Office  [phone]  CC: Primary care physician; Patient, No Pcp Per  Note: This dictation was prepared with Dragon dictation along with smaller phrase technology. Any transcriptional errors that result from this process are unintentional.

## 2018-03-06 MED ORDER — ALBUTEROL SULFATE HFA 108 (90 BASE) MCG/ACT IN AERS: 2.0000 | INHALATION_SPRAY | RESPIRATORY_TRACT | 2 refills | Status: DC | PRN

## 2018-03-06 MED ORDER — IPRATROPIUM-ALBUTEROL 0.5-2.5 (3) MG/3ML IN SOLN
3.0000 mL | Freq: Four times a day (QID) | RESPIRATORY_TRACT | Status: DC
  Filled 2018-03-06: qty 3

## 2018-03-06 MED ORDER — PREDNISONE 10 MG PO TABS: 10.0000 mg | ORAL_TABLET | Freq: Every day | ORAL | 0 refills | Status: AC

## 2018-03-06 MED ORDER — IPRATROPIUM-ALBUTEROL 0.5-2.5 (3) MG/3ML IN SOLN
3.0000 mL | RESPIRATORY_TRACT | Status: DC | PRN
  Administered 2018-03-06: 08:00:00 3 mL via RESPIRATORY_TRACT

## 2018-03-06 NOTE — Progress Notes (Signed)
Advanced care plan.  Purpose of the Encounter: CODE STATUS  Parties in Attendance:Patient  Patient's Decision Capacity:Good  Subjective/Patient's story: Admitted for shortness of breath and wheezing  Objective/Medical story Has bronchial asthma exacerbation  Goals of care determination:  Advanced directives discussed For now patient wants everything done cardiac resuscitation, intubation and ventilator if need arises  CODE STATUS: Full code  Time spent discussing advanced care planning: 16 minutes

## 2018-03-06 NOTE — Progress Notes (Signed)
Patient is being discharged home today. IV removed. DC & Rx instructions given and patient acknowledged understanding. Work note from Dr. Tobi BastosPyreddy printed and given to patient in discharge packet.

## 2018-03-06 NOTE — Discharge Summary (Signed)
[facility] - [place] at [facility]   PATIENT NAME: John Schwartz    MR#:  [id number]  DATE OF BIRTH:  [birth year]  DATE OF ADMISSION:  03/04/2018 ADMITTING PHYSICIAN: John Baasadhika Kalisetti, MD  DATE OF DISCHARGE: 03/06/2018 10:10 AM  PRIMARY CARE PHYSICIAN: John Schwartz   ADMISSION DIAGNOSIS:  1.  Acute bronchial asthma exacerbation 2 dyspnea.  DISCHARGE DIAGNOSIS:  1.  Bronchial asthma exacerbation resolved 2.  Dyspnea resolved  SECONDARY DIAGNOSIS:   Past Medical History:  Diagnosis Date  . Asthma   . Bronchospasm      ADMITTING HISTORY John Wonda OldsCousin  is a 22 y.o. male with a known history of moderate persistent asthma on inhalers at home, allergies presents to hospital secondary to worsening breathing and also wheezing today. Patient states he was admitted to the hospital last week for asthma exacerbation and was discharged on prednisone taper.  He still have some prednisone left over.  His asthma is very sensitive to irritants and allergens.  He went to his work today and was exposed to chemical inhalants.  Started feeling chest tightness as soon as he got exposed.  But throughout the day symptoms got worse to the point that he could not breathe and started having audible wheezing and so brought to the emergency room.  His saturations are within normal limits.  In spite of receiving multiple duo nebs, Solu-Medrol and magnesium, he still feels tight and has wheezing.  So he is being admitted for asthma exacerbation  HOSPITAL COURSE:  Patient admitted to medical floor.  Patient started on IV Solu-Medrol and nebulization treatments.  Tolerated steroids well.  Shortness of breath improved.  Patient will be discharged home on oral steroids and continue inhaler treatments.  CONSULTS OBTAINED:    DRUG ALLERGIES:  No Known Allergies  DISCHARGE MEDICATIONS:   Allergies as of 03/06/2018   No Known Allergies     Medication List    STOP taking these  medications   brompheniramine-pseudoephedrine-DM 30-2-10 MG/5ML syrup   chlorpheniramine-HYDROcodone 10-8 MG/5ML Suer Commonly known as:  TUSSIONEX PENNKINETIC ER   ibuprofen 800 MG tablet Commonly known as:  ADVIL,MOTRIN     TAKE these medications   albuterol 108 (90 Base) MCG/ACT inhaler Commonly known as:  PROVENTIL HFA;VENTOLIN HFA Inhale 2 puffs into the lungs every 4 (four) hours as needed for up to 15 days for wheezing or shortness of breath. What changed:  Another medication with the same name was removed. Continue taking this medication, and follow the directions you see here.   predniSONE 10 MG tablet Commonly known as:  DELTASONE Take 1 tablet (10 mg total) by mouth daily for 6 days. Label  & dispense according to the schedule below.  6 tablets day one, then 5 table day 2, then 4 tablets day 3, then 3 tablets day 4, 2 tablets day 5, then 1 tablet day 6, then stop What changed:    medication strength  how much to take  when to take this  additional instructions  Another medication with the same name was removed. Continue taking this medication, and follow the directions you see here.       Today  Patient seen and evaluated today No shortness of breath and wheezing Has good appetite  VITAL SIGNS:  Blood pressure (!) 103/52, pulse 93, temperature 98.7 F (37.1 C), temperature source Oral, resp. rate 16, height 5\' 10"  (1.778 m), weight 78.1 kg (172 lb 1.6 oz), SpO2 97 %.  I/O:  Intake/Output Summary (Last 24 hours) at 03/06/2018 1216 Last data filed at 03/06/2018 0400 Gross Schwartz 24 hour  Intake 480 ml  Output -  Net 480 ml    PHYSICAL EXAMINATION:  Physical Exam  GENERAL:  22 y.o.-year-old patient lying in the bed with no acute distress.  LUNGS: Normal breath sounds bilaterally, no wheezing, rales,rhonchi or crepitation. No use of accessory muscles of respiration.  CARDIOVASCULAR: S1, S2 normal. No murmurs, rubs, or gallops.  ABDOMEN: Soft,  non-tender, non-distended. Bowel sounds present. No organomegaly or mass.  NEUROLOGIC: Moves all 4 extremities. PSYCHIATRIC: The patient is alert and oriented x 3.  SKIN: No obvious rash, lesion, or ulcer.   DATA REVIEW:   CBC Recent Labs  Lab 03/05/18 0507  WBC 9.7  HGB 14.8  HCT 43.6  PLT 284    Chemistries  Recent Labs  Lab 03/05/18 0507  NA 137  K 4.3  CL 107  CO2 26  GLUCOSE 152*  BUN 10  CREATININE 1.02  CALCIUM 9.2    Cardiac Enzymes Recent Labs  Lab 02/27/18 2030  TROPONINI <0.03    Microbiology Results  Results for orders placed or performed during the hospital encounter of 02/11/14  Rapid strep screen     Status: None   Collection Time: 02/11/14  2:20 PM  Result Value Ref Range Status   Streptococcus, Group A Screen (Direct) NEGATIVE NEGATIVE Final    Comment: (NOTE) A Rapid Antigen test may result negative if the antigen level in the sample is below the detection level of this test. The FDA has not cleared this test as a stand-alone test therefore the rapid antigen negative result has reflexed to a Group A Strep culture.  Culture, Group A Strep     Status: None   Collection Time: 02/11/14  2:20 PM  Result Value Ref Range Status   Specimen Description THROAT  Final   Special Requests NONE  Final   Culture   Final    No Beta Hemolytic Streptococci Isolated Performed at [facility]   Report Status 02/13/2014 FINAL  Final    RADIOLOGY:  Dg Chest 2 View  Result Date: 03/04/2018 CLINICAL DATA:  Shortness of breath EXAM: CHEST - 2 VIEW COMPARISON:  02/27/2018 FINDINGS: The heart size and mediastinal contours are within normal limits. Both lungs are clear. The visualized skeletal structures are unremarkable. IMPRESSION: Normal chest. Electronically Signed   By: Deatra Robinson M.D.   On: 03/04/2018 18:49    Follow up with PCP in 1 week.  Management plans discussed with the patient, family and they are in agreement.  CODE STATUS: Full  code    Code Status Orders  (From admission, onward)        Start     Ordered   03/04/18 2105  Full code  Continuous     03/04/18 2104    Code Status History    Date Active Date Inactive Code Status Order ID Comments User Context   11/01/2016 1502 11/02/2016 1656 Full Code [id number]  Shaune Pollack, MD ED   06/04/2016 0858 06/07/2016 2006 Full Code [id number]  Russella Dar, NP Inpatient   03/16/2016 0330 03/17/2016 1426 Full Code [id number]  [name], MD Inpatient   02/11/2014 1907 02/14/2014 2110 Full Code [id number]  Nash Shearer, MD ED      TOTAL TIME TAKING CARE OF THIS PATIENT ON DAY OF DISCHARGE: more than 30 minutes.   [name] M.D on 03/06/2018 at 12:16 PM  Between  7am to 6pm - Pager - [phone]  After 6pm go to [email or web address] - password [email or web address]  [facility] [place] [facility]  Office  [phone]  CC: Primary care physician; John Schwartz  Note: This dictation was prepared with Dragon dictation along with smaller phrase technology. Any transcriptional errors that result from this process are unintentional.

## 2018-03-06 NOTE — Progress Notes (Signed)
[facility] - [place] at [facility]    John Schwartz was admitted to the Hospital on 03/04/2018 and Discharged  03/06/2018 and should be excused from work/school   for 3  days starting 03/04/2018 , may return to work/school without any restrictions from 03/07/2018  Call John AustinPavan Pyreddy MD with questions.  John AustinPavan Schwartz M.D on 03/06/2018,at 9:29 AM  [facility] - [place] at [facility]    Office  [phone]

## 2018-03-07 LAB — HIV ANTIBODY (ROUTINE TESTING W REFLEX): HIV Screen 4th Generation wRfx: NONREACTIVE

## 2018-03-15 ENCOUNTER — Telehealth: Payer: Self-pay | Admitting: Adult Health Nurse Practitioner

## 2018-03-15 NOTE — Telephone Encounter (Signed)
He called back, and went over the application process, and he is eligible. Mailed out application today.

## 2018-03-15 NOTE — Telephone Encounter (Signed)
Called and no answer. Call back

## 2018-03-20 ENCOUNTER — Other Ambulatory Visit: Payer: Self-pay

## 2018-03-20 DIAGNOSIS — J45901 Unspecified asthma with (acute) exacerbation: Secondary | ICD-10-CM | POA: Insufficient documentation

## 2018-03-20 MED ORDER — ALBUTEROL SULFATE (2.5 MG/3ML) 0.083% IN NEBU
INHALATION_SOLUTION | RESPIRATORY_TRACT | Status: AC
  Administered 2018-03-20: 5 mg via RESPIRATORY_TRACT
  Filled 2018-03-20: qty 6

## 2018-03-20 MED ORDER — ALBUTEROL SULFATE (2.5 MG/3ML) 0.083% IN NEBU
5.0000 mg | INHALATION_SOLUTION | Freq: Once | RESPIRATORY_TRACT | Status: AC
  Administered 2018-03-20: 5 mg via RESPIRATORY_TRACT

## 2018-03-20 NOTE — ED Triage Notes (Signed)
Pt reports difficulty breathing for the past 24 hours, states hx of asthma and not getting relief by using his inhalers or neb treatments. Pt is working to breathe with prolonged expiration, pt reports his last breathing treatment at home was 30 min pta. Neb treatment initiated in triage

## 2018-03-21 ENCOUNTER — Emergency Department: Payer: Self-pay

## 2018-03-21 ENCOUNTER — Emergency Department
Admission: EM | Admit: 2018-03-21 | Discharge: 2018-03-21 | Disposition: A | Discharge status: Home / Self Care | Payer: Self-pay | Attending: Emergency Medicine | Admitting: Emergency Medicine

## 2018-03-21 DIAGNOSIS — J45901 Unspecified asthma with (acute) exacerbation: Secondary | ICD-10-CM

## 2018-03-21 LAB — CBC WITH DIFFERENTIAL/PLATELET
Basophils Absolute: 0.1 10*3/uL (ref 0–0.1)
Basophils Relative: 1 %
EOS ABS: 0.8 10*3/uL — AB (ref 0–0.7)
Eosinophils Relative: 10 %
HCT: 43.9 % (ref 40.0–52.0)
Hemoglobin: 14.7 g/dL (ref 13.0–18.0)
Lymphocytes Relative: 29 %
Lymphs Abs: 2.4 10*3/uL (ref 1.0–3.6)
MCH: 27.8 pg (ref 26.0–34.0)
MCHC: 33.5 g/dL (ref 32.0–36.0)
MCV: 83.1 fL (ref 80.0–100.0)
MONOS PCT: 8 %
Monocytes Absolute: 0.7 10*3/uL (ref 0.2–1.0)
NEUTROS ABS: 4.4 10*3/uL (ref 1.4–6.5)
NEUTROS PCT: 52 %
Platelets: 279 10*3/uL (ref 150–440)
RBC: 5.28 MIL/uL (ref 4.40–5.90)
RDW: 13.4 % (ref 11.5–14.5)
WBC: 8.2 10*3/uL (ref 3.8–10.6)

## 2018-03-21 LAB — BASIC METABOLIC PANEL
Anion gap: 7 (ref 5–15)
BUN: 15 mg/dL (ref 6–20)
CHLORIDE: 103 mmol/L (ref 101–111)
CO2: 29 mmol/L (ref 22–32)
CREATININE: 1.26 mg/dL — AB (ref 0.61–1.24)
Calcium: 9.5 mg/dL (ref 8.9–10.3)
GFR calc Af Amer: >60 mL/min (ref >60)
GFR calc non Af Amer: >60 mL/min (ref >60)
GLUCOSE: 92 mg/dL (ref 65–99)
POTASSIUM: 4.1 mmol/L (ref 3.5–5.1)
SODIUM: 139 mmol/L (ref 135–145)

## 2018-03-21 MED ORDER — ALBUTEROL SULFATE (2.5 MG/3ML) 0.083% IN NEBU: 2.5000 mg | INHALATION_SOLUTION | RESPIRATORY_TRACT | 0 refills | Status: DC | PRN

## 2018-03-21 MED ORDER — METHYLPREDNISOLONE SODIUM SUCC 125 MG IJ SOLR
125.0000 mg | Freq: Once | INTRAMUSCULAR | Status: AC
  Administered 2018-03-21: 125 mg via INTRAVENOUS
  Filled 2018-03-21: qty 2

## 2018-03-21 MED ORDER — PREDNISONE 20 MG PO TABS
ORAL_TABLET | ORAL | 0 refills | Status: DC
Start: 2018-03-21 — End: 2018-04-24

## 2018-03-21 MED ORDER — ALBUTEROL SULFATE HFA 108 (90 BASE) MCG/ACT IN AERS: 2.0000 | INHALATION_SPRAY | RESPIRATORY_TRACT | 0 refills | Status: DC | PRN

## 2018-03-21 MED ORDER — IPRATROPIUM-ALBUTEROL 0.5-2.5 (3) MG/3ML IN SOLN
3.0000 mL | Freq: Once | RESPIRATORY_TRACT | Status: AC
  Administered 2018-03-21: 3 mL via RESPIRATORY_TRACT
  Filled 2018-03-21: qty 3

## 2018-03-21 NOTE — ED Notes (Signed)
Pt states feeling better at this time.

## 2018-03-21 NOTE — Discharge Instructions (Addendum)
1.  Finish steroids as prescribed (Prednisone 60 mg daily x 4 days). Start your next dose Tuesday. 2.  Use your albuterol inhaler or nebulizer every 4 hours as needed for difficulty breathing. 3.  Return to the ER for worsening symptoms, persistent vomiting, difficulty breathing or other concerns.

## 2018-03-21 NOTE — ED Provider Notes (Signed)
[facility] Emergency Department Provider Note   ____________________________________________   First MD Initiated Contact with Patient 03/21/18 [phone]     (approximate)  I have reviewed the triage vital signs and the nursing notes.   HISTORY  Chief Complaint Asthma    HPI John Schwartz is a 22 y.o. male who presents to the ED from home with a chief complaint of difficulty breathing.  Patient has a history of asthma with hospitalizations and intubation in 2016.  States he thinks the weather change has exacerbated his asthma.  Reports difficulty breathing for the past 24 hours.  Using his inhalers and nebulizer treatments without relief of symptoms.  No associated fever, chills, chest pain, abdominal pain, nausea, vomiting, diarrhea.  Denies recent travel or trauma.   Past Medical History:  Diagnosis Date  . Asthma   . Bronchospasm     Patient Active Problem List   Diagnosis Date Noted  . Asthma with status asthmaticus 06/04/2016  . Acute hypokalemia 06/04/2016  . Noncompliance with medication treatment due to underuse of medication 06/04/2016  . Acute renal insufficiency 06/04/2016  . Acute asthma exacerbation 06/04/2016  . Dyspnea 03/16/2016  . Asthma exacerbation 03/16/2016  . Status asthmaticus 02/11/2014  . Acute respiratory failure (HCC) 02/11/2014    Past Surgical History:  Procedure Laterality Date  . NO PAST SURGERIES      Prior to Admission medications   Medication Sig Start Date End Date Taking? Authorizing Provider  albuterol (PROVENTIL HFA;VENTOLIN HFA) 108 (90 Base) MCG/ACT inhaler Inhale 2 puffs into the lungs every 4 (four) hours as needed for wheezing or shortness of breath. 03/21/18   Irean Hong, MD  albuterol (PROVENTIL) (2.5 MG/3ML) 0.083% nebulizer solution Take 3 mLs (2.5 mg total) by nebulization every 4 (four) hours as needed for wheezing or shortness of breath. 03/21/18   Irean Hong, MD  predniSONE (DELTASONE) 20 MG  tablet 3 tablets daily x 4 days 03/21/18   Irean Hong, MD    Allergies Patient has no known allergies.  Family History  Problem Relation Age of Onset  . Arthritis Paternal Grandmother     Social History Social History   Tobacco Use  . Smoking status: Never Smoker  . Smokeless tobacco: Never Used  Substance Use Topics  . Alcohol use: No  . Drug use: No    Comment: Hx of smoking marajauna, last used over a year ago    Review of Systems  Constitutional: No fever/chills. Eyes: No visual changes. ENT: No sore throat. Cardiovascular: Denies chest pain. Respiratory: Positive for wheezing and shortness of breath. Gastrointestinal: No abdominal pain.  No nausea, no vomiting.  No diarrhea.  No constipation. Genitourinary: Negative for dysuria. Musculoskeletal: Negative for back pain. Skin: Negative for rash. Neurological: Negative for headaches, focal weakness or numbness.   ____________________________________________   PHYSICAL EXAM:  VITAL SIGNS: ED Triage Vitals  Enc Vitals Group     BP 03/20/18 2318 (!) 128/107     Pulse Rate 03/20/18 2318 75     Resp 03/20/18 2318 (!) 24     Temp 03/20/18 2318 98.1 F (36.7 C)     Temp Source 03/20/18 2318 Oral     SpO2 03/20/18 2318 98 %     Weight 03/20/18 2319 165 lb (74.8 kg)     Height 03/20/18 2319  (1.778 m)     Head Circumference --      Peak Flow --  Pain Score 03/20/18 2319 8     Pain Loc --      Pain Edu? --      Excl. in GC? --     Constitutional: Alert and oriented. Well appearing and in no acute distress. Eyes: Conjunctivae are normal. PERRL. EOMI. Head: Atraumatic. Nose: No congestion/rhinnorhea. Mouth/Throat: Mucous membranes are moist.  Oropharynx non-erythematous. Neck: No stridor.   Cardiovascular: Normal rate, regular rhythm. Grossly normal heart sounds.  Good peripheral circulation. Respiratory: Normal respiratory effort.  No retractions. Lungs diminished bilaterally. Gastrointestinal:  Soft and nontender. No distention. No abdominal bruits. No CVA tenderness. Musculoskeletal: No lower extremity tenderness nor edema.  No joint effusions. Neurologic:  Normal speech and language. No gross focal neurologic deficits are appreciated. No gait instability. Skin:  Skin is warm, dry and intact. No rash noted. Psychiatric: Mood and affect are normal. Speech and behavior are normal.  ____________________________________________   LABS (all labs ordered are listed, but only abnormal results are displayed)  Labs Reviewed  CBC WITH DIFFERENTIAL/PLATELET - Abnormal; Notable for the following components:      Result Value   Eosinophils Absolute 0.8 (*)    All other components within normal limits  BASIC METABOLIC PANEL - Abnormal; Notable for the following components:   Creatinine, Ser 1.26 (*)    All other components within normal limits   ____________________________________________  EKG  None ____________________________________________  RADIOLOGY  ED MD interpretation: No pneumonia  Official radiology report(s): Dg Chest 2 View  Result Date: 03/21/2018 CLINICAL DATA:  22 y/o M; 24 hours of difficulty breathing. History of asthma. EXAM: CHEST - 2 VIEW COMPARISON:  03/04/2018 chest radiograph FINDINGS: Stable heart size and mediastinal contours are within normal limits. Both lungs are clear. The visualized skeletal structures are unremarkable. IMPRESSION: No acute pulmonary process identified. Electronically Signed   By: Mitzi Hansen M.D.   On: 03/21/2018 04:49    ____________________________________________   PROCEDURES  Procedure(s) performed: None  Procedures  Critical Care performed: No  ____________________________________________   INITIAL IMPRESSION / ASSESSMENT AND PLAN / ED COURSE  As part of my medical decision making, I reviewed the following data within the electronic MEDICAL RECORD NUMBER Nursing notes reviewed and incorporated, Labs  reviewed, EKG interpreted, Old chart reviewed, Radiograph reviewed and Notes from prior ED visits   22 year old male with asthma who presents with exacerbation.  Differential diagnosis includes but is not limited to asthma exacerbation, pneumonia, bronchitis, influenza, etc.  Patient received albuterol nebulizer in triage without significant improvement.  Will administer IV Solu-Medrol, DuoNeb and reassess.  Will check screening lab work and chest x-ray.   Clinical Course as of Mar 22 603  Mon Mar 21, 2018  1610 Patient asleep.  Lungs are clear to auscultation bilaterally with good aeration.  Room air saturations 97%.  Discussed with patient and given strict return precautions.  Patient verbalizes understanding and agrees with plan of care.   [JS]    Clinical Course User Index [JS] Irean Hong, MD     ____________________________________________   FINAL CLINICAL IMPRESSION(S) / ED DIAGNOSES  Final diagnoses:  Moderate asthma with exacerbation, unspecified whether persistent     ED Discharge Orders        Ordered    albuterol (PROVENTIL HFA;VENTOLIN HFA) 108 (90 Base) MCG/ACT inhaler  Every 4 hours PRN     03/21/18 0604    albuterol (PROVENTIL) (2.5 MG/3ML) 0.083% nebulizer solution  Every 4 hours PRN     03/21/18 0604  predniSONE (DELTASONE) 20 MG tablet     03/21/18 0604       Note:  This document was prepared using Dragon voice recognition software and may include unintentional dictation errors.    Irean Hong, MD 03/21/18 [phone]

## 2018-04-15 ENCOUNTER — Other Ambulatory Visit: Payer: Self-pay

## 2018-04-15 ENCOUNTER — Encounter: Payer: Self-pay | Admitting: *Deleted

## 2018-04-15 ENCOUNTER — Emergency Department
Admission: EM | Admit: 2018-04-15 | Discharge: 2018-04-16 | Disposition: A | Discharge status: Home / Self Care | Payer: Medicaid Other | Attending: Emergency Medicine | Admitting: Emergency Medicine

## 2018-04-15 DIAGNOSIS — Z5321 Procedure and treatment not carried out due to patient leaving prior to being seen by health care provider: Secondary | ICD-10-CM | POA: Insufficient documentation

## 2018-04-15 DIAGNOSIS — R0602 Shortness of breath: Secondary | ICD-10-CM | POA: Insufficient documentation

## 2018-04-15 MED ORDER — ALBUTEROL SULFATE (2.5 MG/3ML) 0.083% IN NEBU
INHALATION_SOLUTION | RESPIRATORY_TRACT | Status: AC
  Filled 2018-04-15: qty 6

## 2018-04-15 MED ORDER — ALBUTEROL SULFATE (2.5 MG/3ML) 0.083% IN NEBU
5.0000 mg | INHALATION_SOLUTION | Freq: Once | RESPIRATORY_TRACT | Status: AC
  Administered 2018-04-15: 5 mg via RESPIRATORY_TRACT

## 2018-04-15 NOTE — ED Triage Notes (Signed)
After breathing treatment, breathing improved, only a mild wheeze on the left side. Pt agrees to notify nurse first if he starts feeling sob again.

## 2018-04-15 NOTE — ED Triage Notes (Signed)
Pt reports chest tightness and sob for several hours. Hx of asthma, insp/exp wheezing and cough. Has used nebs/inhaler.

## 2018-04-18 ENCOUNTER — Telehealth: Payer: Self-pay | Admitting: Emergency Medicine

## 2018-04-18 NOTE — Telephone Encounter (Signed)
Called patient due to lwot to inquire about condition and follow up plans. Says he continues to wake up and niight and is stuffy inhead,  His pcp is unable to see him as they have no appointments coming up.  Is thinking about coming back here.  I told him he can always return here or go to urgent care if needed.

## 2018-04-23 ENCOUNTER — Encounter: Payer: Self-pay | Admitting: Emergency Medicine

## 2018-04-23 DIAGNOSIS — J45909 Unspecified asthma, uncomplicated: Secondary | ICD-10-CM | POA: Insufficient documentation

## 2018-04-23 DIAGNOSIS — Z5321 Procedure and treatment not carried out due to patient leaving prior to being seen by health care provider: Secondary | ICD-10-CM | POA: Insufficient documentation

## 2018-04-23 DIAGNOSIS — R0602 Shortness of breath: Secondary | ICD-10-CM | POA: Insufficient documentation

## 2018-04-23 MED ORDER — IPRATROPIUM-ALBUTEROL 0.5-2.5 (3) MG/3ML IN SOLN
3.0000 mL | Freq: Once | RESPIRATORY_TRACT | Status: AC
  Administered 2018-04-23: 3 mL via RESPIRATORY_TRACT

## 2018-04-23 MED ORDER — IPRATROPIUM-ALBUTEROL 0.5-2.5 (3) MG/3ML IN SOLN
RESPIRATORY_TRACT | Status: AC
  Administered 2018-04-23: 3 mL via RESPIRATORY_TRACT
  Filled 2018-04-23: qty 9

## 2018-04-23 NOTE — ED Triage Notes (Signed)
Patient with complaint of shortness of breath that started this morning. Patient with a history of asthma. Patient has used his inhaler with no improvement. Patient with audible wheezes and labored breathing.

## 2018-04-24 ENCOUNTER — Encounter: Payer: Self-pay | Admitting: Emergency Medicine

## 2018-04-24 ENCOUNTER — Emergency Department: Payer: Self-pay

## 2018-04-24 ENCOUNTER — Other Ambulatory Visit: Payer: Self-pay

## 2018-04-24 ENCOUNTER — Emergency Department
Admission: EM | Admit: 2018-04-24 | Discharge: 2018-04-24 | Disposition: A | Discharge status: Home / Self Care | Payer: Self-pay | Attending: Emergency Medicine | Admitting: Emergency Medicine

## 2018-04-24 DIAGNOSIS — J45901 Unspecified asthma with (acute) exacerbation: Secondary | ICD-10-CM | POA: Insufficient documentation

## 2018-04-24 DIAGNOSIS — R0602 Shortness of breath: Secondary | ICD-10-CM

## 2018-04-24 MED ORDER — PREDNISONE 20 MG PO TABS
60.0000 mg | ORAL_TABLET | Freq: Once | ORAL | Status: AC
  Administered 2018-04-24: 60 mg via ORAL
  Filled 2018-04-24: qty 3

## 2018-04-24 MED ORDER — IPRATROPIUM-ALBUTEROL 0.5-2.5 (3) MG/3ML IN SOLN
3.0000 mL | Freq: Once | RESPIRATORY_TRACT | Status: AC
  Administered 2018-04-24: 3 mL via RESPIRATORY_TRACT

## 2018-04-24 MED ORDER — ALBUTEROL SULFATE (2.5 MG/3ML) 0.083% IN NEBU
10.0000 mg/h | INHALATION_SOLUTION | Freq: Once | RESPIRATORY_TRACT | Status: AC
  Administered 2018-04-24: 10 mg/h via RESPIRATORY_TRACT
  Filled 2018-04-24: qty 12

## 2018-04-24 MED ORDER — IPRATROPIUM-ALBUTEROL 0.5-2.5 (3) MG/3ML IN SOLN
RESPIRATORY_TRACT | Status: AC
  Filled 2018-04-24: qty 6

## 2018-04-24 MED ORDER — PREDNISONE 20 MG PO TABS: 40.0000 mg | ORAL_TABLET | Freq: Every day | ORAL | 0 refills | Status: DC

## 2018-04-24 NOTE — ED Notes (Signed)
Report to anna, rn

## 2018-04-24 NOTE — ED Notes (Signed)
Pt noted to be absent from WR when rounding

## 2018-04-24 NOTE — Discharge Instructions (Signed)
Please use your breathing treatments at home every 4 hours as needed for difficulty breathing.  Please take your prednisone as prescribed for the next 5 days.  Return to the emergency department for any increased trouble breathing.

## 2018-04-24 NOTE — ED Triage Notes (Signed)
Pt walked up to stat registration and stated "I'm back"; Pt checked in earlier in the shift with an asthma flare up; was given a nebulizer treatment in triage and then left without being seen; pt says his sister got tired of waiting; pt has been using his inhaler with no relief;

## 2018-04-24 NOTE — ED Notes (Signed)
Patient with inspiratory and expiratory wheezes in all fields. Patient returns for a second time tonight after receiving a breathing treatment the first time then leaving because his ride didn't want to wait.

## 2018-04-24 NOTE — ED Notes (Signed)
absent in WR when RN rounding

## 2018-04-24 NOTE — ED Notes (Signed)
Patient transported to X-ray 

## 2018-04-24 NOTE — ED Provider Notes (Signed)
Lungs clear on reexam, patient appropriate for discharge at this time.   Jene EveryKinner, Kimbly Eanes, MD 04/24/18 [phone]

## 2018-04-24 NOTE — ED Provider Notes (Signed)
[facility] Emergency Department Provider Note  Time seen: 4:57 AM  I have reviewed the triage vital signs and the nursing notes.   HISTORY  Chief Complaint Asthma    HPI John Schwartz is a 22 y.o. male with a past medical history of asthma presents to the emergency department for difficulty breathing.  According to the patient since this morning he has been experiencing difficulty breathing consistent with asthma exacerbation.  Came earlier received a breathing treatment had to leave so he left, states he continued to have shortness of breath throughout the day so he returned to the emergency department.  States moderate shortness of breath.  Moderate chest tightness.  Denies any fever.  Slight cough.   Past Medical History:  Diagnosis Date  . Asthma   . Bronchospasm     Patient Active Problem List   Diagnosis Date Noted  . Asthma with status asthmaticus 06/04/2016  . Acute hypokalemia 06/04/2016  . Noncompliance with medication treatment due to underuse of medication 06/04/2016  . Acute renal insufficiency 06/04/2016  . Acute asthma exacerbation 06/04/2016  . Dyspnea 03/16/2016  . Asthma exacerbation 03/16/2016  . Status asthmaticus 02/11/2014  . Acute respiratory failure (HCC) 02/11/2014    Past Surgical History:  Procedure Laterality Date  . NO PAST SURGERIES      Prior to Admission medications   Medication Sig Start Date End Date Taking? Authorizing Provider  albuterol (PROVENTIL HFA;VENTOLIN HFA) 108 (90 Base) MCG/ACT inhaler Inhale 2 puffs into the lungs every 4 (four) hours as needed for wheezing or shortness of breath. 03/21/18  Yes Irean Hong, MD  albuterol (PROVENTIL) (2.5 MG/3ML) 0.083% nebulizer solution Take 3 mLs (2.5 mg total) by nebulization every 4 (four) hours as needed for wheezing or shortness of breath. 03/21/18  Yes Irean Hong, MD    No Known Allergies  Family History  Problem Relation Age of Onset  . Arthritis  Paternal Grandmother     Social History Social History   Tobacco Use  . Smoking status: Never Smoker  . Smokeless tobacco: Never Used  Substance Use Topics  . Alcohol use: No  . Drug use: No    Comment: Hx of smoking marajauna, last used over a year ago    Review of Systems Constitutional: Negative for fever. Eyes: Negative for visual complaints ENT: Negative for recent illness/congestion Cardiovascular: Positive for chest tightness Respiratory: Positive for shortness of breath with wheeze and mild cough Gastrointestinal: Negative for abdominal pain, vomiting  Musculoskeletal: Negative for musculoskeletal complaints All other ROS negative  ____________________________________________   PHYSICAL EXAM:  VITAL SIGNS: ED Triage Vitals  Enc Vitals Group     BP 04/24/18 0421 113/80     Pulse Rate 04/24/18 0421 72     Resp 04/24/18 0421 (!) 24     Temp 04/24/18 0421 98.2 F (36.8 C)     Temp Source 04/24/18 0421 Oral     SpO2 04/24/18 0421 97 %     Weight 04/24/18 0419 165 lb (74.8 kg)     Height 04/24/18 0419 5\' 10"  (1.778 m)     Head Circumference --      Peak Flow --      Pain Score 04/24/18 0426 7     Pain Loc --      Pain Edu? --      Excl. in GC? --     Constitutional: Alert and oriented. Well appearing and in no distress. Eyes: Normal exam ENT  Head: Normocephalic and atraumatic.   Mouth/Throat: Mucous membranes are moist. Cardiovascular: Normal rate, regular rhythm.  Respiratory: Mild tachypnea, moderate expiratory wheeze bilaterally. Gastrointestinal: Soft and nontender. No distention.  Musculoskeletal: Nontender with normal range of motion in all extremities. No lower extremity tenderness or edema. Neurologic:  Normal speech and language. No gross focal neurologic deficits  Skin:  Skin is warm, dry and intact.  Psychiatric: Mood and affect are normal.   INITIAL IMPRESSION / ASSESSMENT AND PLAN / ED COURSE  Pertinent labs & imaging results  that were available during my care of the patient were reviewed by me and considered in my medical decision making (see chart for details).  Patient presents to the emergency department for difficulty breathing.  Patient has wheeze.  Examination and history most consistent with asthma exacerbation.  Patient does have moderate expiratory wheeze on exam currently.  We will treat with duo nebs and prednisone and continue to closely monitor.  Patient's vitals are reassuring at this time.  Patient continues to have wheeze.  We will dose albuterol over 1 hour and continue to monitor.  care signed out to oncoming physician.  ____________________________________________   FINAL CLINICAL IMPRESSION(S) / ED DIAGNOSES  Asthma exacerbation    Minna AntisPaduchowski, Sharlon Pfohl, MD 04/26/18 1506

## 2018-04-24 NOTE — ED Notes (Signed)
Pt tolerating nebulizer well. Call bell at right side.

## 2018-05-07 ENCOUNTER — Other Ambulatory Visit: Payer: Self-pay

## 2018-05-07 ENCOUNTER — Encounter: Payer: Self-pay | Admitting: Emergency Medicine

## 2018-05-07 ENCOUNTER — Emergency Department
Admission: EM | Admit: 2018-05-07 | Discharge: 2018-05-07 | Disposition: A | Discharge status: Home / Self Care | Payer: Medicaid Other | Attending: Student in an Organized Health Care Education/Training Program | Admitting: Student in an Organized Health Care Education/Training Program

## 2018-05-07 DIAGNOSIS — J4521 Mild intermittent asthma with (acute) exacerbation: Secondary | ICD-10-CM | POA: Insufficient documentation

## 2018-05-07 MED ORDER — PREDNISONE 10 MG (48) PO TBPK: ORAL_TABLET | ORAL | 0 refills | Status: DC

## 2018-05-07 MED ORDER — IPRATROPIUM-ALBUTEROL 0.5-2.5 (3) MG/3ML IN SOLN
3.0000 mL | Freq: Once | RESPIRATORY_TRACT | Status: AC
  Administered 2018-05-07: 3 mL via RESPIRATORY_TRACT
  Filled 2018-05-07: qty 3

## 2018-05-07 MED ORDER — ALBUTEROL SULFATE (2.5 MG/3ML) 0.083% IN NEBU: 2.5000 mg | INHALATION_SOLUTION | Freq: Four times a day (QID) | RESPIRATORY_TRACT | 12 refills | Status: DC | PRN

## 2018-05-07 MED ORDER — IPRATROPIUM-ALBUTEROL 0.5-2.5 (3) MG/3ML IN SOLN: 3.0000 mL | RESPIRATORY_TRACT | 3 refills | Status: DC | PRN

## 2018-05-07 MED ORDER — METHYLPREDNISOLONE SODIUM SUCC 125 MG IJ SOLR
125.0000 mg | Freq: Once | INTRAMUSCULAR | Status: AC
  Administered 2018-05-07: 125 mg via INTRAMUSCULAR
  Filled 2018-05-07: qty 2

## 2018-05-07 NOTE — Discharge Instructions (Addendum)
Follow-up with your regular doctor if not better in 3 to 5 days.  Use medication as prescribed.  Return to emergency department if you are worsening.  Stay out of the heat as this will exacerbate your asthma

## 2018-05-07 NOTE — ED Provider Notes (Signed)
[facility] Emergency Department Provider Note  ____________________________________________   First MD Initiated Contact with Patient 05/07/18 1325     (approximate)  I have reviewed the triage vital signs and the nursing notes.   HISTORY  Chief Complaint Asthma    HPI John Schwartz is a 22 y.o. male presents emergency department complaining of acute asthma exasperation.  He states he ran out of his nebulizer treatments and used inhaler without relief.  He states he finished his prednisone yesterday.  He denies any fever or chills.  He denies any mucus production.  He states this is typical of his asthma.  Past Medical History:  Diagnosis Date  . Asthma   . Bronchospasm     Patient Active Problem List   Diagnosis Date Noted  . Asthma with status asthmaticus 06/04/2016  . Acute hypokalemia 06/04/2016  . Noncompliance with medication treatment due to underuse of medication 06/04/2016  . Acute renal insufficiency 06/04/2016  . Acute asthma exacerbation 06/04/2016  . Dyspnea 03/16/2016  . Asthma exacerbation 03/16/2016  . Status asthmaticus 02/11/2014  . Acute respiratory failure (HCC) 02/11/2014    Past Surgical History:  Procedure Laterality Date  . NO PAST SURGERIES      Prior to Admission medications   Medication Sig Start Date End Date Taking? Authorizing Provider  albuterol (PROVENTIL HFA;VENTOLIN HFA) 108 (90 Base) MCG/ACT inhaler Inhale 2 puffs into the lungs every 4 (four) hours as needed for wheezing or shortness of breath. 03/21/18   Irean HongSung, Jade J, MD  albuterol (PROVENTIL) (2.5 MG/3ML) 0.083% nebulizer solution Take 3 mLs (2.5 mg total) by nebulization every 6 (six) hours as needed for wheezing or shortness of breath. 05/07/18   Avelardo Reesman, Roselyn BeringSusan W, PA-C  ipratropium-albuterol (DUONEB) 0.5-2.5 (3) MG/3ML SOLN Take 3 mLs by nebulization every 4 (four) hours as needed. 05/07/18   Marcey Persad, Roselyn BeringSusan W, PA-C  predniSONE (STERAPRED UNI-PAK 48 TAB) 10  MG (48) TBPK tablet Take 6 pills for 2 days then decrease by 1 pill every 2 days 05/07/18   Faythe GheeFisher, Talaysha Freeberg W, PA-C    Allergies Patient has no known allergies.  Family History  Problem Relation Age of Onset  . Arthritis Paternal Grandmother     Social History Social History   Tobacco Use  . Smoking status: Never Smoker  . Smokeless tobacco: Never Used  Substance Use Topics  . Alcohol use: No  . Drug use: No    Comment: Hx of smoking marajauna, last used over a year ago    Review of Systems  Constitutional: No fever/chills Eyes: No visual changes. ENT: No sore throat. Respiratory: Positive for wheezing and cough Genitourinary: Negative for dysuria. Musculoskeletal: Negative for back pain. Skin: Negative for rash.    ____________________________________________   PHYSICAL EXAM:  VITAL SIGNS: ED Triage Vitals  Enc Vitals Group     BP 05/07/18 1257 120/74     Pulse Rate 05/07/18 1257 61     Resp 05/07/18 1257 18     Temp 05/07/18 1257 98.5 F (36.9 C)     Temp Source 05/07/18 1257 Oral     SpO2 05/07/18 1257 98 %     Weight 05/07/18 1257 165 lb (74.8 kg)     Height 05/07/18 1257 5\' 10"  (1.778 m)     Head Circumference --      Peak Flow --      Pain Score 05/07/18 1259 0     Pain Loc --  Pain Edu? --      Excl. in GC? --     Constitutional: Alert and oriented. Well appearing and in no acute distress. Eyes: Conjunctivae are normal.  Head: Atraumatic. Nose: No congestion/rhinnorhea. Mouth/Throat: Mucous membranes are moist.   Cardiovascular: Normal rate, regular rhythm.  Heart sounds are normal Respiratory: Normal respiratory effort.  No retractions, positive for audible wheezing, wheezing throughout all lung fields GU: deferred Musculoskeletal: FROM all extremities, warm and well perfused Neurologic:  Normal speech and language.  Skin:  Skin is warm, dry and intact. No rash noted. Psychiatric: Mood and affect are normal. Speech and behavior are  normal.  ____________________________________________   LABS (all labs ordered are listed, but only abnormal results are displayed)  Labs Reviewed - No data to display ____________________________________________   ____________________________________________  RADIOLOGY    ____________________________________________   PROCEDURES  Procedure(s) performed: DuoNeb, Solu-Medrol 125 mg IM  Procedures    ____________________________________________   INITIAL IMPRESSION / ASSESSMENT AND PLAN / ED COURSE  Pertinent labs & imaging results that were available during my care of the patient were reviewed by me and considered in my medical decision making (see chart for details).  Patient is a 22 year old male presents emergency department concerns of a asthma exasperation.  He states he ran out of his nebulizer solutions and also finished his prednisone yesterday.  On physical exam patient appears well.  He has audible wheezing.  Lungs with diffuse wheezing all lung fields.  Remainder of the exam is unremarkable  Patient was given Solu-Medrol 125 mg IM and a DuoNeb.  After the DuoNeb the lungs are clear to auscultation.  Discussed the findings with patient.  Explained to him this is an acute asthma exasperation.  He was given a prescription for albuterol Nebules, DuoNeb Nebules, and I Sterapred Dosepak for 12 days.  He is to follow-up with his regular doctor if not better in 3 to 5 days.  Return emergency department if worsening.  He states he understands will comply with our instructions.  He was discharged in stable condition     As part of my medical decision making, I reviewed the following data within the electronic MEDICAL RECORD NUMBER Nursing notes reviewed and incorporated, Old chart reviewed, Notes from prior ED visits and [place] Controlled Substance Database  ____________________________________________   FINAL CLINICAL IMPRESSION(S) / ED DIAGNOSES  Final diagnoses:    Mild intermittent asthma with exacerbation      NEW MEDICATIONS STARTED DURING THIS VISIT:  Discharge Medication List as of 05/07/2018  2:05 PM    START taking these medications   Details  ipratropium-albuterol (DUONEB) 0.5-2.5 (3) MG/3ML SOLN Take 3 mLs by nebulization every 4 (four) hours as needed., Starting Sat 05/07/2018, Print    predniSONE (STERAPRED UNI-PAK 48 TAB) 10 MG (48) TBPK tablet Take 6 pills for 2 days then decrease by 1 pill every 2 days, Print         Note:  This document was prepared using Dragon voice recognition software and may include unintentional dictation errors.    Faythe Ghee, PA-C 05/07/18 1900    Willy Eddy, MD 05/08/18 [id number]

## 2018-05-07 NOTE — ED Notes (Signed)
Pt c/o difficulty breathing with no relief from inhaler this AM. Pt reports running out of nebulizer treatments last night around 7pm. Hx asthma. Inspiratory and expiratory wheezes bilaterally. Inspiratory wheezes audible even without stethoscope.

## 2018-05-07 NOTE — ED Triage Notes (Addendum)
Pt presents to ED c/o asthma exacerbation. Pt reports he was seen for same in this ED 2 wks ago and given prednisone that helped but now he is out. Also states he is out of his regular nebulizer meds. Pt in NAD at this time, ambulatory to triage without difficulty.

## 2018-05-26 ENCOUNTER — Emergency Department: Payer: Self-pay

## 2018-05-26 ENCOUNTER — Emergency Department
Admission: EM | Admit: 2018-05-26 | Discharge: 2018-05-26 | Disposition: A | Discharge status: Home / Self Care | Payer: Self-pay | Attending: Emergency Medicine | Admitting: Emergency Medicine

## 2018-05-26 ENCOUNTER — Other Ambulatory Visit: Payer: Self-pay

## 2018-05-26 DIAGNOSIS — J45901 Unspecified asthma with (acute) exacerbation: Secondary | ICD-10-CM | POA: Insufficient documentation

## 2018-05-26 MED ORDER — PREDNISONE 20 MG PO TABS
60.0000 mg | ORAL_TABLET | Freq: Once | ORAL | Status: AC
  Administered 2018-05-26: 60 mg via ORAL
  Filled 2018-05-26: qty 3

## 2018-05-26 MED ORDER — IPRATROPIUM-ALBUTEROL 0.5-2.5 (3) MG/3ML IN SOLN
3.0000 mL | Freq: Once | RESPIRATORY_TRACT | Status: AC
  Administered 2018-05-26: 3 mL via RESPIRATORY_TRACT
  Filled 2018-05-26: qty 3

## 2018-05-26 MED ORDER — FLUTICASONE-SALMETEROL 100-50 MCG/DOSE IN AEPB: 1.0000 | INHALATION_SPRAY | Freq: Two times a day (BID) | RESPIRATORY_TRACT | 0 refills | Status: DC

## 2018-05-26 MED ORDER — PREDNISONE 50 MG PO TABS: ORAL_TABLET | ORAL | 0 refills | Status: AC

## 2018-05-26 NOTE — ED Notes (Signed)
See triage note.  Presents with some wheezing for the past 2 days  No fever or cough   States he has been using inhaler and SVN at home with min relief

## 2018-05-26 NOTE — ED Triage Notes (Signed)
Pt c/o increased wheezing over the past couple of days, states he has been using his inhaler. Pt is in NAD, respirations WNL. Speaking in complete sentences without difficulty.

## 2018-05-26 NOTE — ED Provider Notes (Signed)
[facility] Emergency Department Provider Note  ____________________________________________  Time seen: Approximately 9:08 AM  I have reviewed the triage vital signs and the nursing notes.   HISTORY  Chief Complaint Asthma    HPI Dagon SENICA CRALL is a 22 y.o. male presents to emergency department for evaluation of wheezing for 2 days.  Patient has asthma and states this is typical of an asthma exacerbation.  He has been using his albuterol inhaler and nebulizer without relief.  He used to use an Advair but discontinued this about 2 years ago.  He was going to come to the emergency department yesterday but went to work instead.  He went to work today and his boss told him to come to the emergency room.  He has seasonal allergies.  No recent illness.  No fever, chills, cough, chest pain.  Past Medical History:  Diagnosis Date  . Asthma   . Bronchospasm     Patient Active Problem List   Diagnosis Date Noted  . Asthma with status asthmaticus 06/04/2016  . Acute hypokalemia 06/04/2016  . Noncompliance with medication treatment due to underuse of medication 06/04/2016  . Acute renal insufficiency 06/04/2016  . Acute asthma exacerbation 06/04/2016  . Dyspnea 03/16/2016  . Asthma exacerbation 03/16/2016  . Status asthmaticus 02/11/2014  . Acute respiratory failure (HCC) 02/11/2014    Past Surgical History:  Procedure Laterality Date  . NO PAST SURGERIES      Prior to Admission medications   Medication Sig Start Date End Date Taking? Authorizing Provider  albuterol (PROVENTIL HFA;VENTOLIN HFA) 108 (90 Base) MCG/ACT inhaler Inhale 2 puffs into the lungs every 4 (four) hours as needed for wheezing or shortness of breath. 03/21/18   Irean Hong, MD  albuterol (PROVENTIL) (2.5 MG/3ML) 0.083% nebulizer solution Take 3 mLs (2.5 mg total) by nebulization every 6 (six) hours as needed for wheezing or shortness of breath. 05/07/18   Sherrie Mustache Roselyn Bering, PA-C   Fluticasone-Salmeterol (ADVAIR DISKUS) 100-50 MCG/DOSE AEPB Inhale 1 puff into the lungs 2 (two) times daily. 05/26/18 05/26/19  Enid Derry, PA-C  ipratropium-albuterol (DUONEB) 0.5-2.5 (3) MG/3ML SOLN Take 3 mLs by nebulization every 4 (four) hours as needed. 05/07/18   Sherrie Mustache Roselyn Bering, PA-C  predniSONE (DELTASONE) 50 MG tablet Take 1 pill daily 05/27/18 05/30/18  Enid Derry, PA-C    Allergies Patient has no known allergies.  Family History  Problem Relation Age of Onset  . Arthritis Paternal Grandmother     Social History Social History   Tobacco Use  . Smoking status: Never Smoker  . Smokeless tobacco: Never Used  Substance Use Topics  . Alcohol use: No  . Drug use: No    Comment: Hx of smoking marajauna, last used over a year ago     Review of Systems  Constitutional: No fever/chills ENT: Negative for congestion and rhinorrhea. Cardiovascular: No chest pain. Respiratory: Positive for cough and SOB. Gastrointestinal: No abdominal pain.  No nausea, no vomiting.   Skin: Negative for rash, abrasions, lacerations, ecchymosis. Neurological: Negative for headaches.   ____________________________________________   PHYSICAL EXAM:  VITAL SIGNS: ED Triage Vitals [05/26/18 0812]  Enc Vitals Group     BP 126/68     Pulse Rate 71     Resp 17     Temp 98.1 F (36.7 C)     Temp Source Oral     SpO2 97 %     Weight 165 lb (74.8 kg)     Height 5'  10" (1.778 m)     Head Circumference      Peak Flow      Pain Score 0     Pain Loc      Pain Edu?      Excl. in GC?      Constitutional: Alert and oriented. Well appearing and in no acute distress. Eyes: Conjunctivae are normal. PERRL. EOMI. No discharge. Head: Atraumatic. ENT: No frontal and maxillary sinus tenderness.      Ears:       Nose: No congestion/rhinnorhea.      Mouth/Throat: Mucous membranes are moist.  Neck: No stridor.   Hematological/Lymphatic/Immunilogical: No cervical  lymphadenopathy. Cardiovascular: Normal rate, regular rhythm.  Good peripheral circulation. Respiratory: Normal respiratory effort without tachypnea or retractions. Wheezing in all lung fields. Good air entry to the bases with no decreased or absent breath sounds. Gastrointestinal: Bowel sounds 4 quadrants. Soft and nontender to palpation. No guarding or rigidity. No palpable masses. No distention. Musculoskeletal: Full range of motion to all extremities. No gross deformities appreciated. Neurologic:  Normal speech and language. No gross focal neurologic deficits are appreciated.  Skin:  Skin is warm, dry and intact. No rash noted. Psychiatric: Mood and affect are normal. Speech and behavior are normal. Patient exhibits appropriate insight and judgement.   ____________________________________________   LABS (all labs ordered are listed, but only abnormal results are displayed)  Labs Reviewed - No data to display ____________________________________________  EKG   ____________________________________________  RADIOLOGY Lexine BatonI, Batina Dougan, personally viewed and evaluated these images (plain radiographs) as part of my medical decision making, as well as reviewing the written report by the radiologist.  Dg Chest 2 View  Result Date: 05/26/2018 CLINICAL DATA:  Progressively worsening wheezing over the past 2 days. Current history of asthma. EXAM: CHEST - 2 VIEW COMPARISON:  04/24/2018, 03/21/2018 and earlier. FINDINGS: Cardiomediastinal silhouette unremarkable. Mild central peribronchial thickening, unchanged. Lungs otherwise clear. No localized airspace consolidation. No pleural effusions. No pneumothorax. Normal pulmonary vascularity. Visualized bony thorax intact. IMPRESSION: Mild changes of asthma and/or bronchitis. No acute cardiopulmonary disease otherwise. Electronically Signed   By: Hulan Saashomas  Lawrence M.D.   On: 05/26/2018 09:21     ____________________________________________    PROCEDURES  Procedure(s) performed:    Procedures    Medications  ipratropium-albuterol (DUONEB) 0.5-2.5 (3) MG/3ML nebulizer solution 3 mL (3 mLs Nebulization Given 05/26/18 0856)  predniSONE (DELTASONE) tablet 60 mg (60 mg Oral Given 05/26/18 1002)     ____________________________________________   INITIAL IMPRESSION / ASSESSMENT AND PLAN / ED COURSE  Pertinent labs & imaging results that were available during my care of the patient were reviewed by me and considered in my medical decision making (see chart for details).  Review of the [place] CSRS was performed in accordance of the [facility] prior to dispensing any controlled drugs.     Patient's diagnosis is consistent with asthma exacerbation. Vital signs and exam are reassuring.  Chest x-ray consistent with changes of asthma.  Patient has been evaluated multiple times in the last 3 months for asthma exacerbations.  We discussed that patient needs to be reevaluated by pulmonology and/or primary care due to frequent exacerbations.  I will give him one more course of prednisone, and I will restart him on his Advair to help reduce exacerbations.  He is agreeable to follow-up with pulmonology or primary care to continue this medicine. Patient feels comfortable going home. Patient will be discharged home with prescriptions for advair and prednisone. He will continue albuterol  inhaler and nebulizer. Patient is to follow up with pulmonologist as needed or otherwise directed. Patient is given ED precautions to return to the ED for any worsening or new symptoms.     ____________________________________________  FINAL CLINICAL IMPRESSION(S) / ED DIAGNOSES  Final diagnoses:  Exacerbation of asthma, unspecified asthma severity, unspecified whether persistent      NEW MEDICATIONS STARTED DURING THIS VISIT:  ED Discharge Orders        Ordered    predniSONE (DELTASONE) 50 MG tablet      05/26/18 1009    Fluticasone-Salmeterol (ADVAIR DISKUS) 100-50 MCG/DOSE AEPB  2 times daily     05/26/18 1009          This chart was dictated using voice recognition software/Dragon. Despite best efforts to proofread, errors can occur which can change the meaning. Any change was purely unintentional.    Enid Derry, PA-C 05/26/18 1343    Dionne Bucy, MD 05/26/18 1451

## 2018-06-02 ENCOUNTER — Emergency Department
Admission: EM | Admit: 2018-06-02 | Discharge: 2018-06-02 | Disposition: A | Discharge status: Home / Self Care | Payer: Medicaid Other | Attending: Emergency Medicine | Admitting: Emergency Medicine

## 2018-06-02 ENCOUNTER — Other Ambulatory Visit: Payer: Self-pay

## 2018-06-02 ENCOUNTER — Emergency Department: Payer: Medicaid Other

## 2018-06-02 DIAGNOSIS — J45901 Unspecified asthma with (acute) exacerbation: Secondary | ICD-10-CM | POA: Insufficient documentation

## 2018-06-02 LAB — CBC
HEMATOCRIT: 41 % (ref 40.0–52.0)
Hemoglobin: 13.8 g/dL (ref 13.0–18.0)
MCH: 28.1 pg (ref 26.0–34.0)
MCHC: 33.8 g/dL (ref 32.0–36.0)
MCV: 83.2 fL (ref 80.0–100.0)
Platelets: 316 10*3/uL (ref 150–440)
RBC: 4.93 MIL/uL (ref 4.40–5.90)
RDW: 12.9 % (ref 11.5–14.5)
WBC: 7.4 10*3/uL (ref 3.8–10.6)

## 2018-06-02 LAB — BASIC METABOLIC PANEL
Anion gap: 8 (ref 5–15)
BUN: 8 mg/dL (ref 6–20)
CO2: 26 mmol/L (ref 22–32)
Calcium: 8.9 mg/dL (ref 8.9–10.3)
Chloride: 107 mmol/L (ref 98–111)
Creatinine, Ser: 1.15 mg/dL (ref 0.61–1.24)
GFR calc Af Amer: >60 mL/min (ref >60)
GFR calc non Af Amer: >60 mL/min (ref >60)
GLUCOSE: 133 mg/dL — AB (ref 70–99)
POTASSIUM: 3.4 mmol/L — AB (ref 3.5–5.1)
SODIUM: 141 mmol/L (ref 135–145)

## 2018-06-02 LAB — TROPONIN I: Troponin I: <0.03 ng/mL (ref <0.03)

## 2018-06-02 MED ORDER — IPRATROPIUM-ALBUTEROL 0.5-2.5 (3) MG/3ML IN SOLN
3.0000 mL | Freq: Once | RESPIRATORY_TRACT | Status: AC
  Administered 2018-06-02: 3 mL via RESPIRATORY_TRACT

## 2018-06-02 MED ORDER — ALBUTEROL SULFATE (2.5 MG/3ML) 0.083% IN NEBU
INHALATION_SOLUTION | RESPIRATORY_TRACT | Status: AC
  Filled 2018-06-02: qty 12

## 2018-06-02 MED ORDER — PREDNISONE 20 MG PO TABS: 60.0000 mg | ORAL_TABLET | Freq: Every day | ORAL | 0 refills | Status: AC

## 2018-06-02 MED ORDER — ALBUTEROL SULFATE (2.5 MG/3ML) 0.083% IN NEBU: 2.5000 mg | INHALATION_SOLUTION | Freq: Four times a day (QID) | RESPIRATORY_TRACT | 1 refills | Status: DC | PRN

## 2018-06-02 MED ORDER — PREDNISONE 20 MG PO TABS
60.0000 mg | ORAL_TABLET | Freq: Once | ORAL | Status: AC
  Administered 2018-06-02: 60 mg via ORAL

## 2018-06-02 MED ORDER — ALBUTEROL SULFATE (2.5 MG/3ML) 0.083% IN NEBU
10.0000 mg | INHALATION_SOLUTION | Freq: Once | RESPIRATORY_TRACT | Status: AC
  Administered 2018-06-02: 10 mg via RESPIRATORY_TRACT

## 2018-06-02 NOTE — ED Notes (Signed)
Pt unable to sign due to signature pad malfunction.

## 2018-06-02 NOTE — Discharge Instructions (Addendum)

## 2018-06-02 NOTE — ED Triage Notes (Addendum)
Pt comes via POV with c/o  Mid chest pain and SHOB that started yesterday and got worse today. Pt does have asthma and had recent attack about a week ago. Pt also states productive cough with yellow mucous.  Pt denies N/V, fever, chills and back pain. Pt is alert and speaking in clear sentences.

## 2018-06-02 NOTE — ED Provider Notes (Signed)
[facility] Emergency Department Provider Note  ____________________________________________  Time seen: Approximately 7:21 PM  I have reviewed the triage vital signs and the nursing notes.   HISTORY  Chief Complaint Chest Pain and Shortness of Breath   HPI [facility] is a 22 y.o. male with a history of asthma who presents for evaluation of an asthma exacerbation.  Patient reports that he has had shortness of breath, wheezing, cough productive of yellow mucus, and chest tightness since yesterday.  He has been using his inhalers at home.  His symptoms became more severe today.  His symptoms are constant and worse with ambulation.  Usually produced by a viral URI or the weather.  The weather has been extremely hot and humid.  No fever or chills.  No personal or family history of blood clots, recent travel immobilization, leg pain or swelling, hemoptysis, sharp pleuritic chest pain.  Past Medical History:  Diagnosis Date  . Asthma   . Bronchospasm     Patient Active Problem List   Diagnosis Date Noted  . Asthma with status asthmaticus 06/04/2016  . Acute hypokalemia 06/04/2016  . Noncompliance with medication treatment due to underuse of medication 06/04/2016  . Acute renal insufficiency 06/04/2016  . Acute asthma exacerbation 06/04/2016  . Dyspnea 03/16/2016  . Asthma exacerbation 03/16/2016  . Status asthmaticus 02/11/2014  . Acute respiratory failure (HCC) 02/11/2014    Past Surgical History:  Procedure Laterality Date  . NO PAST SURGERIES      Prior to Admission medications   Medication Sig Start Date End Date Taking? Authorizing Provider  albuterol (PROVENTIL HFA;VENTOLIN HFA) 108 (90 Base) MCG/ACT inhaler Inhale 2 puffs into the lungs every 4 (four) hours as needed for wheezing or shortness of breath. 03/21/18   Irean Hong, MD  albuterol (PROVENTIL) (2.5 MG/3ML) 0.083% nebulizer solution Take 3 mLs (2.5 mg total) by nebulization every 6  (six) hours as needed for wheezing or shortness of breath. 06/02/18   Nita Sickle, MD  Fluticasone-Salmeterol (ADVAIR DISKUS) 100-50 MCG/DOSE AEPB Inhale 1 puff into the lungs 2 (two) times daily. 05/26/18 05/26/19  Enid Derry, PA-C  ipratropium-albuterol (DUONEB) 0.5-2.5 (3) MG/3ML SOLN Take 3 mLs by nebulization every 4 (four) hours as needed. 05/07/18   Fisher, Roselyn Bering, PA-C  predniSONE (DELTASONE) 20 MG tablet Take 3 tablets (60 mg total) by mouth daily for 4 days. 06/02/18 06/06/18  Nita Sickle, MD    Allergies Patient has no known allergies.  Family History  Problem Relation Age of Onset  . Arthritis Paternal Grandmother     Social History Social History   Tobacco Use  . Smoking status: Never Smoker  . Smokeless tobacco: Never Used  Substance Use Topics  . Alcohol use: No  . Drug use: No    Comment: Hx of smoking marajauna, last used over a year ago    Review of Systems  Constitutional: Negative for fever. Eyes: Negative for visual changes. ENT: Negative for sore throat. Neck: No neck pain  Cardiovascular: + chest tightness Respiratory: + shortness of breath, cough Gastrointestinal: Negative for abdominal pain, vomiting or diarrhea. Genitourinary: Negative for dysuria. Musculoskeletal: Negative for back pain. Skin: Negative for rash. Neurological: Negative for headaches, weakness or numbness. Psych: No SI or HI  ____________________________________________   PHYSICAL EXAM:  VITAL SIGNS: ED Triage Vitals  Enc Vitals Group     BP 06/02/18 1556 123/75     Pulse Rate 06/02/18 1556 82     Resp 06/02/18  1556 18     Temp 06/02/18 1556 98.4 F (36.9 C)     Temp Source 06/02/18 1556 Oral     SpO2 06/02/18 1556 97 %     Weight 06/02/18 1545 165 lb (74.8 kg)     Height 06/02/18 1545 5\' 10"  (1.778 m)     Head Circumference --      Peak Flow --      Pain Score 06/02/18 1544 9     Pain Loc --      Pain Edu? --      Excl. in GC? --      Constitutional: Alert and oriented. Well appearing and in no apparent distress. HEENT:      Head: Normocephalic and atraumatic.         Eyes: Conjunctivae are normal. Sclera is non-icteric.       Mouth/Throat: Mucous membranes are moist.       Neck: Supple with no signs of meningismus. Cardiovascular: Regular rate and rhythm. No murmurs, gallops, or rubs. 2+ symmetrical distal pulses are present in all extremities. No JVD. Respiratory: Slightly increased work of breathing, normal set.  Diffuse expiratory wheezes and decreased air movement bilaterally  Gastrointestinal: Soft, non tender, and non distended with positive bowel sounds. No rebound or guarding. Musculoskeletal: Nontender with normal range of motion in all extremities. No edema, cyanosis, or erythema of extremities. Neurologic: Normal speech and language. Face is symmetric. Moving all extremities. No gross focal neurologic deficits are appreciated. Skin: Skin is warm, dry and intact. No rash noted. Psychiatric: Mood and affect are normal. Speech and behavior are normal.  ____________________________________________   LABS (all labs ordered are listed, but only abnormal results are displayed)  Labs Reviewed  BASIC METABOLIC PANEL - Abnormal; Notable for the following components:      Result Value   Potassium 3.4 (*)    Glucose, Bld 133 (*)    All other components within normal limits  CBC  TROPONIN I   ____________________________________________  EKG  ED ECG REPORT I, Nita Sickle, the attending physician, personally viewed and interpreted this ECG.  Normal sinus rhythm, rate of 93, right axis deviation, normal intervals, ST elevation in V2 with no reciprocal changes, biphasic T wave in inferior leads.  These findings are unchanged from prior. ____________________________________________  RADIOLOGY  I have personally reviewed the images performed during this visit and I agree with the Radiologist's  read.   Interpretation by Radiologist:  Dg Chest 2 View  Result Date: 06/02/2018 CLINICAL DATA:  Chest tightness, SOB, wheezing x 2 days, hx asthma, pt states used inhaler but did not get relief, states works in a non A/[employer] building EXAM: CHEST - 2 VIEW COMPARISON:  05/26/2018 FINDINGS: Lungs are clear. Heart size and mediastinal contours are within normal limits. No effusion. Visualized bones unremarkable. IMPRESSION: No acute cardiopulmonary disease. Electronically Signed   By: Corlis Leak M.D.   On: 06/02/2018 16:15     ____________________________________________   PROCEDURES  Procedure(s) performed: None Procedures Critical Care performed:  None ____________________________________________   INITIAL IMPRESSION / ASSESSMENT AND PLAN / ED COURSE   22 y.o. male with a history of asthma who presents for evaluation of an asthma exacerbation.  Patient with mildly increased work of breathing, normal sats, diffuse expiratory wheezes and decreased air movement.  PERC negative.  EKG with no evidence of ischemia.  Chest x-ray with no evidence of pneumonia or pneumothorax.  Will give 3 DuoNeb's, prednisone, and reassess.  Clinical Course as  of Jun 02 1920  Thu Jun 02, 2018  1735 Patient reports feeling slightly improved but still wheezing significantly after 3 DuoNeb's.  Will start patient on 10 mg of albuterol an hour for 1 hour and reassess.    [CV]  1919 Patient reports feeling markedly improved, normal sats, normal breathing, no longer wheezing.  At this time is stable for discharge home with close follow-up with primary care doctor.  Patient will be given prednisone and refill for his albuterol.  Discussed return precautions for new or worsening shortness of breath or chest pain.    [CV]    Clinical Course User Index [CV] Don PerkingVeronese, [place], MD     As part of my medical decision making, I reviewed the following data within the electronic MEDICAL RECORD NUMBER Nursing notes reviewed and  incorporated, Labs reviewed , EKG interpreted , Old EKG reviewed, Old chart reviewed, Radiograph reviewed , Notes from prior ED visits and [place] Controlled Substance Database    Pertinent labs & imaging results that were available during my care of the patient were reviewed by me and considered in my medical decision making (see chart for details).    ____________________________________________   FINAL CLINICAL IMPRESSION(S) / ED DIAGNOSES  Final diagnoses:  Exacerbation of asthma, unspecified asthma severity, unspecified whether persistent      NEW MEDICATIONS STARTED DURING THIS VISIT:  ED Discharge Orders        Ordered    albuterol (PROVENTIL) (2.5 MG/3ML) 0.083% nebulizer solution  Every 6 hours PRN     06/02/18 1921    predniSONE (DELTASONE) 20 MG tablet  Daily     06/02/18 1921       Note:  This document was prepared using Dragon voice recognition software and may include unintentional dictation errors.    Don PerkingVeronese, [place], MD 06/02/18 [phone]

## 2018-07-21 ENCOUNTER — Other Ambulatory Visit: Payer: Self-pay

## 2018-07-21 ENCOUNTER — Encounter: Payer: Self-pay | Admitting: Emergency Medicine

## 2018-07-21 ENCOUNTER — Emergency Department
Admission: EM | Admit: 2018-07-21 | Discharge: 2018-07-21 | Disposition: A | Discharge status: Home / Self Care | Payer: Medicaid Other | Attending: Emergency Medicine | Admitting: Emergency Medicine

## 2018-07-21 DIAGNOSIS — Z79899 Other long term (current) drug therapy: Secondary | ICD-10-CM | POA: Diagnosis not present

## 2018-07-21 DIAGNOSIS — J45901 Unspecified asthma with (acute) exacerbation: Secondary | ICD-10-CM | POA: Insufficient documentation

## 2018-07-21 DIAGNOSIS — R05 Cough: Secondary | ICD-10-CM | POA: Diagnosis present

## 2018-07-21 MED ORDER — PREDNISONE 20 MG PO TABS
60.0000 mg | ORAL_TABLET | Freq: Once | ORAL | Status: AC
  Administered 2018-07-21: 60 mg via ORAL
  Filled 2018-07-21: qty 3

## 2018-07-21 MED ORDER — PREDNISONE 20 MG PO TABS: ORAL_TABLET | ORAL | 0 refills | Status: DC

## 2018-07-21 MED ORDER — IPRATROPIUM-ALBUTEROL 0.5-2.5 (3) MG/3ML IN SOLN
3.0000 mL | Freq: Once | RESPIRATORY_TRACT | Status: AC
  Administered 2018-07-21: 3 mL via RESPIRATORY_TRACT
  Filled 2018-07-21: qty 3

## 2018-07-21 NOTE — ED Notes (Signed)
Pt states he has wheezing and a cough for 2 days.  Pt using inhaler without relief.  No nonsmoker.  No fever.  Pt alert.

## 2018-07-21 NOTE — ED Notes (Signed)
Pt reports no change after breathing tx. Pt has sinus congestion. No distress at this time.

## 2018-07-21 NOTE — ED Triage Notes (Signed)
Patient ambulatory to triage with steady gait, without difficulty or distress noted, pt is on phone during entire triage; pt reports since yesterday has had issues with his asthma--cough & wheezing; using albuterol without relief; has had recent cold symptoms

## 2018-07-21 NOTE — ED Notes (Signed)
Report off to sherie rn 

## 2018-07-21 NOTE — Discharge Instructions (Signed)
1.  Finish steroid as prescribed (Prednisone 60 mg daily x4 days).  Start your next dose Friday morning. 2.  Continue to use your albuterol inhaler and/or nebulizer every 4 hours as needed for wheezing/difficulty breathing. 3.  Return to the ER for worsening symptoms, persistent vomiting, difficulty breathing or other concerns.

## 2018-07-21 NOTE — ED Provider Notes (Signed)
[facility] Emergency Department Provider Note   ____________________________________________   First MD Initiated Contact with Patient 07/21/18 [phone]     (approximate)  I have reviewed the triage vital signs and the nursing notes.   HISTORY  Chief Complaint Asthma    HPI John Schwartz is a 22 y.o. male who presents to the ED from home with a chief complaint of asthma attack.  Patient has a history of asthma with prior hospitalization and intubation who has had cold-like symptoms for the past several days.  Complains of cough productive of yellow sputum.  Denies associated fever, chills, chest pain, abdominal pain, nausea or vomiting.  Denies recent travel or trauma.   Past Medical History:  Diagnosis Date  . Asthma   . Bronchospasm     Patient Active Problem List   Diagnosis Date Noted  . Asthma with status asthmaticus 06/04/2016  . Acute hypokalemia 06/04/2016  . Noncompliance with medication treatment due to underuse of medication 06/04/2016  . Acute renal insufficiency 06/04/2016  . Acute asthma exacerbation 06/04/2016  . Dyspnea 03/16/2016  . Asthma exacerbation 03/16/2016  . Status asthmaticus 02/11/2014  . Acute respiratory failure (HCC) 02/11/2014    Past Surgical History:  Procedure Laterality Date  . NO PAST SURGERIES      Prior to Admission medications   Medication Sig Start Date End Date Taking? Authorizing Provider  albuterol (PROVENTIL HFA;VENTOLIN HFA) 108 (90 Base) MCG/ACT inhaler Inhale 2 puffs into the lungs every 4 (four) hours as needed for wheezing or shortness of breath. 03/21/18   Irean Hong, MD  albuterol (PROVENTIL) (2.5 MG/3ML) 0.083% nebulizer solution Take 3 mLs (2.5 mg total) by nebulization every 6 (six) hours as needed for wheezing or shortness of breath. 06/02/18   Nita Sickle, MD  Fluticasone-Salmeterol (ADVAIR DISKUS) 100-50 MCG/DOSE AEPB Inhale 1 puff into the lungs 2 (two) times daily. 05/26/18  05/26/19  Enid Derry, PA-C  ipratropium-albuterol (DUONEB) 0.5-2.5 (3) MG/3ML SOLN Take 3 mLs by nebulization every 4 (four) hours as needed. 05/07/18   Sherrie Mustache Roselyn Bering, PA-C  predniSONE (DELTASONE) 20 MG tablet 3 tablets daily x 4 days 07/21/18   Irean Hong, MD    Allergies Patient has no known allergies.  Family History  Problem Relation Age of Onset  . Arthritis Paternal Grandmother     Social History Social History   Tobacco Use  . Smoking status: Never Smoker  . Smokeless tobacco: Never Used  Substance Use Topics  . Alcohol use: No  . Drug use: No    Comment: Hx of smoking marajauna, last used over a year ago    Review of Systems  Constitutional: No fever/chills Eyes: No visual changes. ENT: No sore throat. Cardiovascular: Denies chest pain. Respiratory: Positive for productive cough, wheezing and shortness of breath. Gastrointestinal: No abdominal pain.  No nausea, no vomiting.  No diarrhea.  No constipation. Genitourinary: Negative for dysuria. Musculoskeletal: Negative for back pain. Skin: Negative for rash. Neurological: Negative for headaches, focal weakness or numbness.   ____________________________________________   PHYSICAL EXAM:  VITAL SIGNS: ED Triage Vitals [07/21/18 0102]  Enc Vitals Group     BP 127/74     Pulse Rate 60     Resp 20     Temp 97.9 F (36.6 C)     Temp Source Oral     SpO2 97 %     Weight 160 lb (72.6 kg)     Height 5\' 10"  (1.778 m)  Head Circumference      Peak Flow      Pain Score 0     Pain Loc      Pain Edu?      Excl. in GC?     Constitutional: Alert and oriented. Well appearing and in no acute distress.  Playing a card game on his cell phone the entire duration of our interview and exam. Eyes: Conjunctivae are normal. PERRL. EOMI. Head: Atraumatic. Nose: Congestion/rhinnorhea. Mouth/Throat: Mucous membranes are moist.  Oropharynx non-erythematous. Neck: No stridor.   Cardiovascular: Normal rate, regular  rhythm. Grossly normal heart sounds.  Good peripheral circulation. Respiratory: Normal respiratory effort.  No retractions. Lungs with scattered wheezing. Gastrointestinal: Soft and nontender. No distention. No abdominal bruits. No CVA tenderness. Musculoskeletal: No lower extremity tenderness nor edema.  No joint effusions. Neurologic:  Normal speech and language. No gross focal neurologic deficits are appreciated. No gait instability. Skin:  Skin is warm, dry and intact. No rash noted. Psychiatric: Mood and affect are normal. Speech and behavior are normal.  ____________________________________________   LABS (all labs ordered are listed, but only abnormal results are displayed)  Labs Reviewed - No data to display ____________________________________________  EKG  None ____________________________________________  RADIOLOGY  ED MD interpretation: None  Official radiology report(s): No results found.  ____________________________________________   PROCEDURES  Procedure(s) performed: None  Procedures  Critical Care performed: No  ____________________________________________   INITIAL IMPRESSION / ASSESSMENT AND PLAN / ED COURSE  As part of my medical decision making, I reviewed the following data within the electronic MEDICAL RECORD NUMBER Nursing notes reviewed and incorporated, Old chart reviewed and Notes from prior ED visits   22 year old male with asthma who presents with exacerbation. Differential includes, but is not limited to, viral syndrome, bronchitis including COPD exacerbation, pneumonia, reactive airway disease including asthma, CHF including exacerbation with or without pulmonary/interstitial edema, pneumothorax, ACS, thoracic trauma, and pulmonary embolism.  Patient is resting comfortably in no acute distress, playing on his cell phone the entire time.  Will administer oral prednisone and DuoNeb for asthma exacerbation and reassess.  Clinical Course as  of Jul 22 455  Thu Jul 21, 2018  [id number] Wheezing much improved on reexamination.  Room air saturations 96%.  Will discharge patient on prednisone burst.  He has albuterol inhaler and nebulizers at home.  Strict return precautions given.  Patient verbalizes understanding and agrees with plan of care.   [JS]    Clinical Course User Index [JS] Irean Hong, MD     ____________________________________________   FINAL CLINICAL IMPRESSION(S) / ED DIAGNOSES  Final diagnoses:  Moderate asthma with exacerbation, unspecified whether persistent     ED Discharge Orders         Ordered    predniSONE (DELTASONE) 20 MG tablet     07/21/18 1191           Note:  This document was prepared using Dragon voice recognition software and may include unintentional dictation errors.    Irean Hong, MD 07/21/18 [phone]

## 2018-08-10 ENCOUNTER — Emergency Department: Payer: Medicaid Other

## 2018-08-10 ENCOUNTER — Emergency Department
Admission: EM | Admit: 2018-08-10 | Discharge: 2018-08-10 | Disposition: A | Discharge status: Home / Self Care | Payer: Medicaid Other | Attending: Emergency Medicine | Admitting: Emergency Medicine

## 2018-08-10 ENCOUNTER — Encounter: Payer: Self-pay | Admitting: Emergency Medicine

## 2018-08-10 DIAGNOSIS — M25571 Pain in right ankle and joints of right foot: Secondary | ICD-10-CM | POA: Diagnosis not present

## 2018-08-10 DIAGNOSIS — J45909 Unspecified asthma, uncomplicated: Secondary | ICD-10-CM | POA: Insufficient documentation

## 2018-08-10 MED ORDER — NAPROXEN 500 MG PO TABS
500.0000 mg | ORAL_TABLET | Freq: Once | ORAL | Status: AC
  Administered 2018-08-10: 500 mg via ORAL
  Filled 2018-08-10: qty 1

## 2018-08-10 MED ORDER — NEOMYCIN-POLYMYXIN-PRAMOXINE 1 % EX CREA: TOPICAL_CREAM | Freq: Two times a day (BID) | CUTANEOUS | 0 refills | Status: DC

## 2018-08-10 MED ORDER — BACITRACIN-NEOMYCIN-POLYMYXIN 400-5-5000 EX OINT
TOPICAL_OINTMENT | Freq: Once | CUTANEOUS | Status: AC
  Administered 2018-08-10: 16:00:00 via TOPICAL
  Filled 2018-08-10: qty 1

## 2018-08-10 MED ORDER — NAPROXEN 500 MG PO TABS: 500.0000 mg | ORAL_TABLET | Freq: Two times a day (BID) | ORAL | Status: DC

## 2018-08-10 NOTE — ED Notes (Signed)
See triage noted   Presents with pain to right ankle  States he got it shut in car door  Ambulates with slight limp to room  Good pulses

## 2018-08-10 NOTE — Discharge Instructions (Addendum)
Follow discharge care instruction take medication as directed. °

## 2018-08-10 NOTE — ED Provider Notes (Signed)
[facility] Emergency Department Provider Note   ____________________________________________   First MD Initiated Contact with Patient 08/10/18 1418     (approximate)  I have reviewed the triage vital signs and the nursing notes.   HISTORY  Chief Complaint Leg Pain    HPI John Schwartz is a 22 y.o. male patient complain of right ankle pain secondary to being slammed in a door prior to arrival.  There is an abrasion at the contact point.  Patient rates pain as a 9/10.  Patient described the pain is "aching".  Patient is able to bear weight.  Past Medical History:  Diagnosis Date  . Asthma   . Bronchospasm     Patient Active Problem List   Diagnosis Date Noted  . Asthma with status asthmaticus 06/04/2016  . Acute hypokalemia 06/04/2016  . Noncompliance with medication treatment due to underuse of medication 06/04/2016  . Acute renal insufficiency 06/04/2016  . Acute asthma exacerbation 06/04/2016  . Dyspnea 03/16/2016  . Asthma exacerbation 03/16/2016  . Status asthmaticus 02/11/2014  . Acute respiratory failure (HCC) 02/11/2014    Past Surgical History:  Procedure Laterality Date  . NO PAST SURGERIES      Prior to Admission medications   Medication Sig Start Date End Date Taking? Authorizing Provider  albuterol (PROVENTIL HFA;VENTOLIN HFA) 108 (90 Base) MCG/ACT inhaler Inhale 2 puffs into the lungs every 4 (four) hours as needed for wheezing or shortness of breath. 03/21/18   Irean Hong, MD  albuterol (PROVENTIL) (2.5 MG/3ML) 0.083% nebulizer solution Take 3 mLs (2.5 mg total) by nebulization every 6 (six) hours as needed for wheezing or shortness of breath. 06/02/18   Nita Sickle, MD  Fluticasone-Salmeterol (ADVAIR DISKUS) 100-50 MCG/DOSE AEPB Inhale 1 puff into the lungs 2 (two) times daily. 05/26/18 05/26/19  Enid Derry, PA-C  ipratropium-albuterol (DUONEB) 0.5-2.5 (3) MG/3ML SOLN Take 3 mLs by nebulization every 4 (four) hours  as needed. 05/07/18   Sherrie Mustache Roselyn Bering, PA-C  naproxen (NAPROSYN) 500 MG tablet Take 1 tablet (500 mg total) by mouth 2 (two) times daily with a meal. 08/10/18   Joni Reining, PA-C  neomycin-polymyxin-pramoxine (NEOSPORIN PLUS) 1 % cream Apply topically 2 (two) times daily. 08/10/18   Joni Reining, PA-C  predniSONE (DELTASONE) 20 MG tablet 3 tablets daily x 4 days 07/21/18   Irean Hong, MD    Allergies Patient has no known allergies.  Family History  Problem Relation Age of Onset  . Arthritis Paternal Grandmother     Social History Social History   Tobacco Use  . Smoking status: Never Smoker  . Smokeless tobacco: Never Used  Substance Use Topics  . Alcohol use: No  . Drug use: No    Comment: Hx of smoking marajauna, last used over a year ago    Review of Systems Constitutional: No fever/chills Eyes: No visual changes. ENT: No sore throat. Cardiovascular: Denies chest pain. Respiratory: Denies shortness of breath. Gastrointestinal: No abdominal pain.  No nausea, no vomiting.  No diarrhea.  No constipation. Genitourinary: Negative for dysuria. Musculoskeletal: Right ankle pain. Skin: Negative for rash.  Abrasion right ankle. Neurological: Negative for headaches, focal weakness or numbness.   ____________________________________________   PHYSICAL EXAM:  VITAL SIGNS: ED Triage Vitals  Enc Vitals Group     BP 08/10/18 1401 136/88     Pulse Rate 08/10/18 1401 96     Resp 08/10/18 1401 18     Temp 08/10/18 1401 99.6 F (  37.6 C)     Temp Source 08/10/18 1401 Oral     SpO2 08/10/18 1401 98 %     Weight 08/10/18 1358 160 lb (72.6 kg)     Height 08/10/18 1358 5\' 9"  (1.753 m)     Head Circumference --      Peak Flow --      Pain Score 08/10/18 1358 9     Pain Loc --      Pain Edu? --      Excl. in GC? --    Constitutional: Alert and oriented. Well appearing and in no acute distress. Cardiovascular: Normal rate, regular rhythm. Grossly normal heart sounds.  Good  peripheral circulation. Respiratory: Normal respiratory effort.  No retractions. Lungs CTAB. Musculoskeletal: No obvious deformity or edema to the right ankle.Marland Kitchen Neurologic:  Normal speech and language. No gross focal neurologic deficits are appreciated. No gait instability. Skin:  Skin is warm, dry and intact. No rash noted.  Abrasion medial aspect the right ankle. Psychiatric: Mood and affect are normal. Speech and behavior are normal.  ____________________________________________   LABS (all labs ordered are listed, but only abnormal results are displayed)  Labs Reviewed - No data to display ____________________________________________  EKG   ____________________________________________  RADIOLOGY  ED MD interpretation:    Official radiology report(s): Dg Ankle Complete Right  Result Date: 08/10/2018 CLINICAL DATA:  Closed ankle in car door.  Medial ankle pain EXAM: RIGHT ANKLE - COMPLETE 3+ VIEW COMPARISON:  None. FINDINGS: There is no evidence of fracture, dislocation, or joint effusion. There is no evidence of arthropathy or other focal bone abnormality. Soft tissues are unremarkable. IMPRESSION: Negative. Electronically Signed   By: Charlett Nose M.D.   On: 08/10/2018 14:41    ____________________________________________   PROCEDURES  Procedure(s) performed: None  Procedures  Critical Care performed: No  ____________________________________________   INITIAL IMPRESSION / ASSESSMENT AND PLAN / ED COURSE  As part of my medical decision making, I reviewed the following data within the electronic MEDICAL RECORD NUMBER    Right ankle pain secondary to contusion and abrasion.  Discussed negative x-ray findings with patient.  Patient given discharge care instruction advised take medication as directed.  [facility].      ____________________________________________   FINAL CLINICAL IMPRESSION(S) / ED DIAGNOSES  Final diagnoses:    Acute right ankle pain     ED Discharge Orders         Ordered    naproxen (NAPROSYN) 500 MG tablet  2 times daily with meals     08/10/18 1547    neomycin-polymyxin-pramoxine (NEOSPORIN PLUS) 1 % cream  2 times daily     08/10/18 1550           Note:  This document was prepared using Dragon voice recognition software and may include unintentional dictation errors.    Joni Reining, PA-C 08/10/18 1553    Charlynne Pander, MD 08/15/18 [phone]

## 2018-08-10 NOTE — ED Triage Notes (Signed)
Pt reports right ankle was slammed ina  Door PTA and now painful. Abrasion noted.

## 2018-08-31 ENCOUNTER — Encounter: Payer: Self-pay | Admitting: *Deleted

## 2018-08-31 ENCOUNTER — Emergency Department: Payer: Medicaid Other

## 2018-08-31 ENCOUNTER — Other Ambulatory Visit: Payer: Self-pay

## 2018-08-31 ENCOUNTER — Emergency Department
Admission: EM | Admit: 2018-08-31 | Discharge: 2018-08-31 | Disposition: A | Discharge status: Home / Self Care | Payer: Medicaid Other | Attending: Emergency Medicine | Admitting: Emergency Medicine

## 2018-08-31 DIAGNOSIS — J45901 Unspecified asthma with (acute) exacerbation: Secondary | ICD-10-CM | POA: Diagnosis not present

## 2018-08-31 DIAGNOSIS — R0602 Shortness of breath: Secondary | ICD-10-CM | POA: Diagnosis present

## 2018-08-31 DIAGNOSIS — Z79899 Other long term (current) drug therapy: Secondary | ICD-10-CM | POA: Diagnosis not present

## 2018-08-31 MED ORDER — ALBUTEROL SULFATE (2.5 MG/3ML) 0.083% IN NEBU
7.5000 mg | INHALATION_SOLUTION | Freq: Once | RESPIRATORY_TRACT | Status: AC
  Administered 2018-08-31: 7.5 mg via RESPIRATORY_TRACT
  Filled 2018-08-31: qty 9

## 2018-08-31 MED ORDER — PREDNISONE 20 MG PO TABS: 60.0000 mg | ORAL_TABLET | Freq: Every day | ORAL | 0 refills | Status: AC

## 2018-08-31 MED ORDER — PREDNISONE 20 MG PO TABS
60.0000 mg | ORAL_TABLET | Freq: Once | ORAL | Status: AC
  Administered 2018-08-31: 60 mg via ORAL
  Filled 2018-08-31: qty 3

## 2018-08-31 MED ORDER — ALBUTEROL SULFATE (2.5 MG/3ML) 0.083% IN NEBU: 2.5000 mg | INHALATION_SOLUTION | RESPIRATORY_TRACT | 0 refills | Status: DC | PRN

## 2018-08-31 MED ORDER — IPRATROPIUM-ALBUTEROL 0.5-2.5 (3) MG/3ML IN SOLN
9.0000 mL | Freq: Once | RESPIRATORY_TRACT | Status: AC
  Administered 2018-08-31: 9 mL via RESPIRATORY_TRACT
  Filled 2018-08-31: qty 9

## 2018-08-31 MED ORDER — ALBUTEROL SULFATE HFA 108 (90 BASE) MCG/ACT IN AERS: 2.0000 | INHALATION_SPRAY | Freq: Four times a day (QID) | RESPIRATORY_TRACT | 2 refills | Status: DC | PRN

## 2018-08-31 MED ORDER — ALBUTEROL SULFATE (2.5 MG/3ML) 0.083% IN NEBU
5.0000 mg | INHALATION_SOLUTION | Freq: Once | RESPIRATORY_TRACT | Status: AC
  Administered 2018-08-31: 5 mg via RESPIRATORY_TRACT
  Filled 2018-08-31: qty 6

## 2018-08-31 NOTE — ED Triage Notes (Signed)
Pt c/o problems with his asthma. Pt reports for three days he has had wheezing and productive (yellow mucous) cough. Fevers yesterday. Has been using nebulizer regularly since last night, last used 1 hour PTA.

## 2018-08-31 NOTE — ED Notes (Signed)
First Nurse Note:  Patient reports history of asthma and using inhaler and nebulizer without relief.  Patient states, "I couldn't sleep last night, I was on the nebulizer all night long."

## 2018-08-31 NOTE — ED Notes (Signed)
No answer when called several times from lobby 

## 2018-08-31 NOTE — ED Notes (Signed)
Pt ambulatory to POV without difficulty. VSS. NAD. Discharge instructions, RX and follow up reviewed. All questions and concerns addressed.  

## 2018-08-31 NOTE — ED Provider Notes (Signed)
[facility] Emergency Department Provider Note  ____________________________________________   First MD Initiated Contact with Patient 08/31/18 2049     (approximate)  I have reviewed the triage vital signs and the nursing notes.   HISTORY  Chief Complaint Cough   HPI John Schwartz is a 22 y.o. male history of asthma who was presented to the emergency department today complaining of 3 days of worsening shortness of breath and chest tightness.  He says that he usually has asthma exacerbation around the time of year when the seasons change.  He says that he has a cough which is nonproductive.  No fever.  No known sick contacts.  Past Medical History:  Diagnosis Date  . Asthma   . Bronchospasm     Patient Active Problem List   Diagnosis Date Noted  . Asthma with status asthmaticus 06/04/2016  . Acute hypokalemia 06/04/2016  . Noncompliance with medication treatment due to underuse of medication 06/04/2016  . Acute renal insufficiency 06/04/2016  . Acute asthma exacerbation 06/04/2016  . Dyspnea 03/16/2016  . Asthma exacerbation 03/16/2016  . Status asthmaticus 02/11/2014  . Acute respiratory failure (HCC) 02/11/2014    Past Surgical History:  Procedure Laterality Date  . NO PAST SURGERIES      Prior to Admission medications   Medication Sig Start Date End Date Taking? Authorizing Provider  albuterol (PROVENTIL HFA;VENTOLIN HFA) 108 (90 Base) MCG/ACT inhaler Inhale 2 puffs into the lungs every 4 (four) hours as needed for wheezing or shortness of breath. 03/21/18   Irean Hong, MD  albuterol (PROVENTIL) (2.5 MG/3ML) 0.083% nebulizer solution Take 3 mLs (2.5 mg total) by nebulization every 6 (six) hours as needed for wheezing or shortness of breath. 06/02/18   Nita Sickle, MD  Fluticasone-Salmeterol (ADVAIR DISKUS) 100-50 MCG/DOSE AEPB Inhale 1 puff into the lungs 2 (two) times daily. 05/26/18 05/26/19  Enid Derry, PA-C    ipratropium-albuterol (DUONEB) 0.5-2.5 (3) MG/3ML SOLN Take 3 mLs by nebulization every 4 (four) hours as needed. 05/07/18   Sherrie Mustache Roselyn Bering, PA-C  naproxen (NAPROSYN) 500 MG tablet Take 1 tablet (500 mg total) by mouth 2 (two) times daily with a meal. 08/10/18   Joni Reining, PA-C  neomycin-polymyxin-pramoxine (NEOSPORIN PLUS) 1 % cream Apply topically 2 (two) times daily. 08/10/18   Joni Reining, PA-C  predniSONE (DELTASONE) 20 MG tablet 3 tablets daily x 4 days 07/21/18   Irean Hong, MD    Allergies Patient has no known allergies.  Family History  Problem Relation Age of Onset  . Arthritis Paternal Grandmother     Social History Social History   Tobacco Use  . Smoking status: Never Smoker  . Smokeless tobacco: Never Used  Substance Use Topics  . Alcohol use: No  . Drug use: No    Comment: Hx of smoking marajauna, last used over a year ago    Review of Systems  Constitutional: No fever/chills Eyes: No visual changes. ENT: No sore throat. Cardiovascular: As above Respiratory: As above Gastrointestinal: No abdominal pain.  No nausea, no vomiting.  No diarrhea.  No constipation. Genitourinary: Negative for dysuria. Musculoskeletal: Negative for back pain. Skin: Negative for rash. Neurological: Negative for headaches, focal weakness or numbness.   ____________________________________________   PHYSICAL EXAM:  VITAL SIGNS: ED Triage Vitals  Enc Vitals Group     BP 08/31/18 1905 138/80     Pulse Rate 08/31/18 1905 89     Resp 08/31/18 1905 16  Temp 08/31/18 1905 98.4 F (36.9 C)     Temp Source 08/31/18 1905 Oral     SpO2 08/31/18 1905 98 %     Weight --      Height 08/31/18 1908 5\' 10"  (1.778 m)     Head Circumference --      Peak Flow --      Pain Score 08/31/18 1908 7     Pain Loc --      Pain Edu? --      Excl. in GC? --     Constitutional: Alert and oriented. Well appearing and in no acute distress. Eyes: Conjunctivae are normal.  Head:  Atraumatic. Nose: No congestion/rhinnorhea. Mouth/Throat: Mucous membranes are moist.  Neck: No stridor.   Cardiovascular: Normal rate, regular rhythm. Grossly normal heart sounds.  Respiratory: Mildly labored respirations with speaking in full sentences.  Prolonged expiratory phase with coarse wheezing throughout all fields. Gastrointestinal: Soft and nontender. No distention. No CVA tenderness. Musculoskeletal: No lower extremity tenderness nor edema.  No joint effusions. Neurologic:  Normal speech and language. No gross focal neurologic deficits are appreciated. Skin:  Skin is warm, dry and intact. No rash noted. Psychiatric: Mood and affect are normal. Speech and behavior are normal.  ____________________________________________   LABS (all labs ordered are listed, but only abnormal results are displayed)  Labs Reviewed - No data to display ____________________________________________  EKG   ____________________________________________  RADIOLOGY  No acute finding on chest x-ray ____________________________________________   PROCEDURES  Procedure(s) performed:   Procedures  Critical Care performed:   ____________________________________________   INITIAL IMPRESSION / ASSESSMENT AND PLAN / ED COURSE  Pertinent labs & imaging results that were available during my care of the patient were reviewed by me and considered in my medical decision making (see chart for details).  Differential includes, but is not limited to, viral syndrome, bronchitis including COPD exacerbation, pneumonia, reactive airway disease including asthma, CHF including exacerbation with or without pulmonary/interstitial edema, pneumothorax, ACS, thoracic trauma, and pulmonary embolism. As part of my medical decision making, I reviewed the following data within the electronic MEDICAL RECORD NUMBER Notes from prior ED visits  ----------------------------------------- 10:15 PM on  08/31/2018 -----------------------------------------  After 3 DuoNeb's as well as steroids the patient is still feeling chest tightness and is still wheezing throughout all fields.  Now with expiratory cough.  Improved air movement but still with coarse wheezes.  We will try an additional round of albuterol nebs.  ----------------------------------------- 10:49 PM on 08/31/2018 -----------------------------------------  Patient at this time with improved lung exam with only scant wheezing.  Patient with respiratory rate of 18.  Able to ambulate throughout the emergency department with 100% oxygen saturation on room air.  Says that he feels subjectively improved.  Patient will be given refill Nebules prescriptions as well as prednisone and albuterol.  He knows to return for any worsening or concerning symptoms.  Will be discharged at this time. ____________________________________________   FINAL CLINICAL IMPRESSION(S) / ED DIAGNOSES  Asthma exacerbation.   NEW MEDICATIONS STARTED DURING THIS VISIT:  New Prescriptions   No medications on file     Note:  This document was prepared using Dragon voice recognition software and may include unintentional dictation errors.     Myrna Blazer, MD 08/31/18 2249

## 2018-09-06 ENCOUNTER — Other Ambulatory Visit: Payer: Self-pay

## 2018-09-06 ENCOUNTER — Emergency Department
Admission: EM | Admit: 2018-09-06 | Discharge: 2018-09-06 | Disposition: A | Discharge status: Home / Self Care | Payer: Medicaid Other | Attending: Emergency Medicine | Admitting: Emergency Medicine

## 2018-09-06 ENCOUNTER — Encounter: Payer: Self-pay | Admitting: Emergency Medicine

## 2018-09-06 ENCOUNTER — Emergency Department: Payer: Medicaid Other

## 2018-09-06 DIAGNOSIS — R05 Cough: Secondary | ICD-10-CM | POA: Diagnosis present

## 2018-09-06 DIAGNOSIS — J4521 Mild intermittent asthma with (acute) exacerbation: Secondary | ICD-10-CM | POA: Insufficient documentation

## 2018-09-06 MED ORDER — ALBUTEROL SULFATE (2.5 MG/3ML) 0.083% IN NEBU: 5.0000 mg | INHALATION_SOLUTION | Freq: Once | RESPIRATORY_TRACT | Status: DC

## 2018-09-06 MED ORDER — BENZONATATE 100 MG PO CAPS: 100.0000 mg | ORAL_CAPSULE | Freq: Three times a day (TID) | ORAL | 0 refills | Status: DC | PRN

## 2018-09-06 MED ORDER — IPRATROPIUM-ALBUTEROL 0.5-2.5 (3) MG/3ML IN SOLN
3.0000 mL | Freq: Once | RESPIRATORY_TRACT | Status: AC
  Administered 2018-09-06: 3 mL via RESPIRATORY_TRACT
  Filled 2018-09-06: qty 3

## 2018-09-06 MED ORDER — IPRATROPIUM-ALBUTEROL 0.5-2.5 (3) MG/3ML IN SOLN
3.0000 mL | Freq: Once | RESPIRATORY_TRACT | Status: AC
  Administered 2018-09-06: 3 mL via RESPIRATORY_TRACT

## 2018-09-06 MED ORDER — IPRATROPIUM-ALBUTEROL 0.5-2.5 (3) MG/3ML IN SOLN
RESPIRATORY_TRACT | Status: AC
  Filled 2018-09-06: qty 3

## 2018-09-06 MED ORDER — DEXAMETHASONE SODIUM PHOSPHATE 10 MG/ML IJ SOLN
10.0000 mg | Freq: Once | INTRAMUSCULAR | Status: AC
  Administered 2018-09-06: 10 mg via INTRAMUSCULAR
  Filled 2018-09-06: qty 1

## 2018-09-06 MED ORDER — METHYLPREDNISOLONE 4 MG PO TBPK: ORAL_TABLET | ORAL | 0 refills | Status: DC

## 2018-09-06 NOTE — ED Provider Notes (Signed)
[facility] Emergency Department Provider Note   ____________________________________________   First MD Initiated Contact with Patient 09/06/18 0845     (approximate)  I have reviewed the triage vital signs and the nursing notes.   HISTORY  Chief Complaint Asthma and Cough    HPI John Schwartz is a 22 y.o. male patient complain of cough and wheezing for few days.  Patient has a history of asthma but state no relief using his inhaler.  Patient states cough is nonproductive.  Patient denies fever/chills.  Patient denies nausea, vomiting, diarrhea.  Patient denies chest pain.  Past Medical History:  Diagnosis Date  . Asthma   . Bronchospasm     Patient Active Problem List   Diagnosis Date Noted  . Asthma with status asthmaticus 06/04/2016  . Acute hypokalemia 06/04/2016  . Noncompliance with medication treatment due to underuse of medication 06/04/2016  . Acute renal insufficiency 06/04/2016  . Acute asthma exacerbation 06/04/2016  . Dyspnea 03/16/2016  . Asthma exacerbation 03/16/2016  . Status asthmaticus 02/11/2014  . Acute respiratory failure (HCC) 02/11/2014    Past Surgical History:  Procedure Laterality Date  . NO PAST SURGERIES      Prior to Admission medications   Medication Sig Start Date End Date Taking? Authorizing Provider  albuterol (PROVENTIL HFA;VENTOLIN HFA) 108 (90 Base) MCG/ACT inhaler Inhale 2 puffs into the lungs every 6 (six) hours as needed for wheezing or shortness of breath. 08/31/18   Myrna Blazer, MD  albuterol (PROVENTIL) (2.5 MG/3ML) 0.083% nebulizer solution Take 3 mLs (2.5 mg total) by nebulization every 4 (four) hours as needed for wheezing or shortness of breath. 08/31/18   Schaevitz, Myra Rude, MD  benzonatate (TESSALON PERLES) 100 MG capsule Take 1 capsule (100 mg total) by mouth 3 (three) times daily as needed for cough. 09/06/18   Joni Reining, PA-C  Fluticasone-Salmeterol (ADVAIR  DISKUS) 100-50 MCG/DOSE AEPB Inhale 1 puff into the lungs 2 (two) times daily. 05/26/18 05/26/19  Enid Derry, PA-C  ipratropium-albuterol (DUONEB) 0.5-2.5 (3) MG/3ML SOLN Take 3 mLs by nebulization every 4 (four) hours as needed. 05/07/18   Faythe Ghee, PA-C  methylPREDNISolone (MEDROL DOSEPAK) 4 MG TBPK tablet Take Tapered dose as directed 09/06/18   Joni Reining, PA-C  naproxen (NAPROSYN) 500 MG tablet Take 1 tablet (500 mg total) by mouth 2 (two) times daily with a meal. 08/10/18   Joni Reining, PA-C  neomycin-polymyxin-pramoxine (NEOSPORIN PLUS) 1 % cream Apply topically 2 (two) times daily. 08/10/18   Joni Reining, PA-C    Allergies Patient has no known allergies.  Family History  Problem Relation Age of Onset  . Arthritis Paternal Grandmother     Social History Social History   Tobacco Use  . Smoking status: Never Smoker  . Smokeless tobacco: Never Used  Substance Use Topics  . Alcohol use: No  . Drug use: No    Comment: Hx of smoking marajauna, last used over a year ago    Review of Systems  Constitutional: No fever/chills Eyes: No visual changes. ENT: No sore throat. Cardiovascular: Denies chest pain. Respiratory: Wheezing and coughing.  Gastrointestinal: No abdominal pain.  No nausea, no vomiting.  No diarrhea.  No constipation. Genitourinary: Negative for dysuria. Musculoskeletal: Negative for back pain. Skin: Negative for rash. Neurological: Negative for headaches, focal weakness or numbness.  ____________________________________________   PHYSICAL EXAM:  VITAL SIGNS: ED Triage Vitals [09/06/18 0826]  Enc Vitals Group  BP 121/85     Pulse Rate 76     Resp 18     Temp 98.3 F (36.8 C)     Temp Source Oral     SpO2 99 %     Weight 165 lb (74.8 kg)     Height 5\' 10"  (1.778 m)     Head Circumference      Peak Flow      Pain Score 0     Pain Loc      Pain Edu?      Excl. in GC?    Constitutional: Alert and oriented. Well appearing  and in no acute distress. Nose: No congestion/rhinnorhea. Mouth/Throat: Mucous membranes are moist.  Oropharynx non-erythematous. Neck: No stridor.  Cardiovascular: Normal rate, regular rhythm. Grossly normal heart sounds.  Good peripheral circulation. Respiratory: Normal respiratory effort.  No retractions. Lungs bilateral inspiratory wheezing.  Nonproductive cough. Neurologic:  Normal speech and language. No gross focal neurologic deficits are appreciated. No gait instability. Skin:  Skin is warm, dry and intact. No rash noted. Psychiatric: Mood and affect are normal. Speech and behavior are normal.  ____________________________________________   LABS (all labs ordered are listed, but only abnormal results are displayed)  Labs Reviewed - No data to display ____________________________________________  EKG EKG read by heart station ducted with no acute findings.  ____________________________________________  RADIOLOGY  ED MD interpretation:    Official radiology report(s): Dg Chest 2 View  Result Date: 09/06/2018 CLINICAL DATA:  Wheezing over the last 3 days without relief from inhaler, history of asthma EXAM: CHEST - 2 VIEW COMPARISON:  Chest x-ray of 08/31/2018 FINDINGS: No active infiltrate or effusion is seen. There is some peribronchial thickening which can be seen with asthma or bronchitis. Mediastinal and hilar contours are unremarkable. The heart is within normal limits in size. No acute bony abnormality is seen. IMPRESSION: 1. No pneumonia or effusion. 2. Peribronchial thickening may indicate chronic bronchitis as with asthma. Electronically Signed   By: Dwyane Dee M.D.   On: 09/06/2018 09:35    ____________________________________________   PROCEDURES  Procedure(s) performed: None  Procedures  Critical Care performed: No  ____________________________________________   INITIAL IMPRESSION / ASSESSMENT AND PLAN / ED COURSE  As part of my medical decision  making, I reviewed the following data within the electronic MEDICAL RECORD NUMBER    Cough and wheezing secondary to asthma.  Discussed chest x-ray findings with patient.  Patient given discharge care instruction.  Patient advised take medication as directed and [facility].      ____________________________________________   FINAL CLINICAL IMPRESSION(S) / ED DIAGNOSES  Final diagnoses:  Mild intermittent asthma with exacerbation     ED Discharge Orders         Ordered    methylPREDNISolone (MEDROL DOSEPAK) 4 MG TBPK tablet     09/06/18 0944    benzonatate (TESSALON PERLES) 100 MG capsule  3 times daily PRN     09/06/18 0944           Note:  This document was prepared using Dragon voice recognition software and may include unintentional dictation errors.    Joni Reining, PA-C 09/06/18 1610    Governor Rooks, MD 09/06/18 [phone]

## 2018-09-06 NOTE — ED Triage Notes (Signed)
Pt states cough and asthma started up a few days ago, coughing in triage, does not appear to be in distress.

## 2018-09-06 NOTE — Discharge Instructions (Addendum)
Continue previous medication and start steroids as directed.

## 2018-09-06 NOTE — ED Notes (Signed)
See triage note  Presents with some wheezing and SOB  States was seen for same about 2 weeks ago   Denies any fever and has been using inhalers and SVN at home  States sx's became worse last pm denies any fever

## 2018-09-06 NOTE — ED Notes (Signed)
Has used inhaler at home with little relief.

## 2018-09-14 ENCOUNTER — Encounter: Payer: Self-pay | Admitting: Emergency Medicine

## 2018-09-14 ENCOUNTER — Other Ambulatory Visit: Payer: Self-pay

## 2018-09-14 ENCOUNTER — Emergency Department
Admission: EM | Admit: 2018-09-14 | Discharge: 2018-09-14 | Disposition: A | Discharge status: Home / Self Care | Payer: Medicaid Other | Attending: Emergency Medicine | Admitting: Emergency Medicine

## 2018-09-14 DIAGNOSIS — R0981 Nasal congestion: Secondary | ICD-10-CM | POA: Diagnosis present

## 2018-09-14 DIAGNOSIS — Z79899 Other long term (current) drug therapy: Secondary | ICD-10-CM | POA: Diagnosis not present

## 2018-09-14 DIAGNOSIS — J45909 Unspecified asthma, uncomplicated: Secondary | ICD-10-CM | POA: Diagnosis not present

## 2018-09-14 DIAGNOSIS — J0100 Acute maxillary sinusitis, unspecified: Secondary | ICD-10-CM | POA: Insufficient documentation

## 2018-09-14 MED ORDER — IBUPROFEN 600 MG PO TABS
600.0000 mg | ORAL_TABLET | Freq: Once | ORAL | Status: AC
  Administered 2018-09-14: 600 mg via ORAL
  Filled 2018-09-14: qty 1

## 2018-09-14 MED ORDER — AMOXICILLIN 500 MG PO CAPS: 500.0000 mg | ORAL_CAPSULE | Freq: Three times a day (TID) | ORAL | 0 refills | Status: DC

## 2018-09-14 MED ORDER — FEXOFENADINE-PSEUDOEPHED ER 60-120 MG PO TB12: 1.0000 | ORAL_TABLET | Freq: Two times a day (BID) | ORAL | 0 refills | Status: DC

## 2018-09-14 MED ORDER — IBUPROFEN 800 MG PO TABS: 800.0000 mg | ORAL_TABLET | Freq: Three times a day (TID) | ORAL | 0 refills | Status: DC | PRN

## 2018-09-14 NOTE — ED Triage Notes (Signed)
Patient complaining of left side facial pain X 2 days with watering left eye.  States he believes he has a sinus infection.  Has yellowish nasal mucus.  Pain when bending over. T-100.6

## 2018-09-14 NOTE — ED Provider Notes (Signed)
[facility] Emergency Department Provider Note   ____________________________________________   None    (approximate)  I have reviewed the triage vital signs and the nursing notes.   HISTORY  Chief Complaint Facial Pain and Nasal Congestion    HPI John Schwartz is a 22 y.o. male patient complain of left facial pain, bilateral ear pain, sore throat, and watery from the left eye.  Patient also has thick nasal discharge.  Patient has productive cough.  Patient states headache which increases when he bends over.  Patient rates his pain discomfort as a 9/10.  No palliative measure for complaint.  Past Medical History:  Diagnosis Date  . Asthma   . Bronchospasm     Patient Active Problem List   Diagnosis Date Noted  . Asthma with status asthmaticus 06/04/2016  . Acute hypokalemia 06/04/2016  . Noncompliance with medication treatment due to underuse of medication 06/04/2016  . Acute renal insufficiency 06/04/2016  . Acute asthma exacerbation 06/04/2016  . Dyspnea 03/16/2016  . Asthma exacerbation 03/16/2016  . Status asthmaticus 02/11/2014  . Acute respiratory failure (HCC) 02/11/2014    Past Surgical History:  Procedure Laterality Date  . NO PAST SURGERIES      Prior to Admission medications   Medication Sig Start Date End Date Taking? Authorizing Provider  albuterol (PROVENTIL HFA;VENTOLIN HFA) 108 (90 Base) MCG/ACT inhaler Inhale 2 puffs into the lungs every 6 (six) hours as needed for wheezing or shortness of breath. 08/31/18   Myrna Blazer, MD  albuterol (PROVENTIL) (2.5 MG/3ML) 0.083% nebulizer solution Take 3 mLs (2.5 mg total) by nebulization every 4 (four) hours as needed for wheezing or shortness of breath. 08/31/18   Schaevitz, Myra Rude, MD  amoxicillin (AMOXIL) 500 MG capsule Take 1 capsule (500 mg total) by mouth 3 (three) times daily. 09/14/18   Joni Reining, PA-C  benzonatate (TESSALON PERLES) 100 MG capsule  Take 1 capsule (100 mg total) by mouth 3 (three) times daily as needed for cough. 09/06/18   Joni Reining, PA-C  fexofenadine-pseudoephedrine (ALLEGRA-D) 60-120 MG 12 hr tablet Take 1 tablet by mouth 2 (two) times daily. 09/14/18   Joni Reining, PA-C  Fluticasone-Salmeterol (ADVAIR DISKUS) 100-50 MCG/DOSE AEPB Inhale 1 puff into the lungs 2 (two) times daily. 05/26/18 05/26/19  Enid Derry, PA-C  ibuprofen (ADVIL,MOTRIN) 800 MG tablet Take 1 tablet (800 mg total) by mouth every 8 (eight) hours as needed for moderate pain. 09/14/18   Joni Reining, PA-C  ipratropium-albuterol (DUONEB) 0.5-2.5 (3) MG/3ML SOLN Take 3 mLs by nebulization every 4 (four) hours as needed. 05/07/18   Faythe Ghee, PA-C  methylPREDNISolone (MEDROL DOSEPAK) 4 MG TBPK tablet Take Tapered dose as directed 09/06/18   Joni Reining, PA-C  naproxen (NAPROSYN) 500 MG tablet Take 1 tablet (500 mg total) by mouth 2 (two) times daily with a meal. 08/10/18   Joni Reining, PA-C  neomycin-polymyxin-pramoxine (NEOSPORIN PLUS) 1 % cream Apply topically 2 (two) times daily. 08/10/18   Joni Reining, PA-C    Allergies Patient has no known allergies.  Family History  Problem Relation Age of Onset  . Arthritis Paternal Grandmother     Social History Social History   Tobacco Use  . Smoking status: Never Smoker  . Smokeless tobacco: Never Used  Substance Use Topics  . Alcohol use: No  . Drug use: No    Comment: Hx of smoking marajauna, last used over a year ago  Review of Systems Constitutional: Fever/chills. Eyes: No visual changes. ENT: Facial pain, nasal congestion, sore throat.   Cardiovascular: Denies chest pain. Respiratory: Denies shortness of breath.  Productive cough. Gastrointestinal: No abdominal pain.  No nausea, no vomiting.  No diarrhea.  No constipation. Genitourinary: Negative for dysuria. Musculoskeletal: Negative for back pain. Skin: Negative for rash. Neurological: Negative for  headaches, focal weakness or numbness.   ____________________________________________   PHYSICAL EXAM:  VITAL SIGNS: ED Triage Vitals  Enc Vitals Group     BP 09/14/18 0918 134/76     Pulse Rate 09/14/18 0918 85     Resp 09/14/18 0918 16     Temp 09/14/18 0918 (!) 100.6 F (38.1 C)     Temp Source 09/14/18 0918 Oral     SpO2 09/14/18 0918 96 %     Weight 09/14/18 0921 165 lb (74.8 kg)     Height 09/14/18 0921 5\' 10"  (1.778 m)     Head Circumference --      Peak Flow --      Pain Score 09/14/18 0918 9     Pain Loc --      Pain Edu? --      Excl. in GC? --     Constitutional: Alert and oriented. Well appearing and in no acute distress.  Febrile. Eyes: Conjunctivae are normal. PERRL. EOMI. Nose: Left maxillary guarding with bilateral edematous nasal turbinates. Mouth/Throat: Mucous membranes are moist.  Oropharynx non-erythematous.  Postnasal drainage. Neck: No stridor.  Cardiovascular: Normal rate, regular rhythm. Grossly normal heart sounds.  Good peripheral circulation. Respiratory: Normal respiratory effort.  No retractions. Lungs CTAB. Gastrointestinal: Soft and nontender. No distention. No abdominal bruits. No CVA tenderness. Musculoskeletal: No lower extremity tenderness nor edema.  No joint effusions. Neurologic:  Normal speech and language. No gross focal neurologic deficits are appreciated. No gait instability. Skin:  Skin is warm, dry and intact. No rash noted. Psychiatric: Mood and affect are normal. Speech and behavior are normal.  ____________________________________________   LABS (all labs ordered are listed, but only abnormal results are displayed)  Labs Reviewed - No data to display ____________________________________________  EKG   ____________________________________________  RADIOLOGY  ED MD interpretation:    Official radiology report(s): No results found.  ____________________________________________   PROCEDURES  Procedure(s)  performed: None  Procedures  Critical Care performed: No  ____________________________________________   INITIAL IMPRESSION / ASSESSMENT AND PLAN / ED COURSE  As part of my medical decision making, I reviewed the following data within the electronic MEDICAL RECORD NUMBER    Subacute maxillary sinusitis.  Patient given discharge care instruction advised take medication as directed.  Patient advised [facility].      ____________________________________________   FINAL CLINICAL IMPRESSION(S) / ED DIAGNOSES  Final diagnoses:  Acute maxillary sinusitis, recurrence not specified     ED Discharge Orders         Ordered    amoxicillin (AMOXIL) 500 MG capsule  3 times daily     09/14/18 1003    fexofenadine-pseudoephedrine (ALLEGRA-D) 60-120 MG 12 hr tablet  2 times daily     09/14/18 1003    ibuprofen (ADVIL,MOTRIN) 800 MG tablet  Every 8 hours PRN     09/14/18 1003           Note:  This document was prepared using Dragon voice recognition software and may include unintentional dictation errors.    Joni Reining, PA-C 09/14/18 1010    Governor Rooks,  MD 09/14/18 1524

## 2018-09-14 NOTE — Discharge Instructions (Addendum)
Follow discharge care instruction take medication as directed. °

## 2018-11-16 ENCOUNTER — Emergency Department: Payer: Medicaid Other

## 2018-11-16 ENCOUNTER — Other Ambulatory Visit: Payer: Self-pay

## 2018-11-16 ENCOUNTER — Observation Stay
Admission: EM | Admit: 2018-11-16 | Discharge: 2018-11-18 | Disposition: A | Discharge status: Home / Self Care | Payer: Medicaid Other | Attending: Internal Medicine | Admitting: Internal Medicine

## 2018-11-16 ENCOUNTER — Encounter: Payer: Self-pay | Admitting: Emergency Medicine

## 2018-11-16 DIAGNOSIS — J4521 Mild intermittent asthma with (acute) exacerbation: Secondary | ICD-10-CM | POA: Diagnosis not present

## 2018-11-16 DIAGNOSIS — Z7951 Long term (current) use of inhaled steroids: Secondary | ICD-10-CM | POA: Diagnosis not present

## 2018-11-16 DIAGNOSIS — T380X5A Adverse effect of glucocorticoids and synthetic analogues, initial encounter: Secondary | ICD-10-CM | POA: Insufficient documentation

## 2018-11-16 DIAGNOSIS — J029 Acute pharyngitis, unspecified: Secondary | ICD-10-CM | POA: Diagnosis not present

## 2018-11-16 DIAGNOSIS — R9431 Abnormal electrocardiogram [ECG] [EKG]: Secondary | ICD-10-CM | POA: Insufficient documentation

## 2018-11-16 DIAGNOSIS — J209 Acute bronchitis, unspecified: Secondary | ICD-10-CM | POA: Diagnosis not present

## 2018-11-16 DIAGNOSIS — J069 Acute upper respiratory infection, unspecified: Secondary | ICD-10-CM | POA: Diagnosis present

## 2018-11-16 DIAGNOSIS — Z79899 Other long term (current) drug therapy: Secondary | ICD-10-CM | POA: Insufficient documentation

## 2018-11-16 DIAGNOSIS — Z791 Long term (current) use of non-steroidal anti-inflammatories (NSAID): Secondary | ICD-10-CM | POA: Diagnosis not present

## 2018-11-16 DIAGNOSIS — R739 Hyperglycemia, unspecified: Secondary | ICD-10-CM | POA: Insufficient documentation

## 2018-11-16 DIAGNOSIS — J45901 Unspecified asthma with (acute) exacerbation: Secondary | ICD-10-CM | POA: Diagnosis present

## 2018-11-16 DIAGNOSIS — J4551 Severe persistent asthma with (acute) exacerbation: Secondary | ICD-10-CM | POA: Diagnosis present

## 2018-11-16 LAB — CBC WITH DIFFERENTIAL/PLATELET
Abs Immature Granulocytes: 0.02 10*3/uL (ref 0.00–0.07)
Basophils Absolute: 0.1 10*3/uL (ref 0.0–0.1)
Basophils Relative: 1 %
EOS PCT: 15 %
Eosinophils Absolute: 1.4 10*3/uL — ABNORMAL HIGH (ref 0.0–0.5)
HCT: 43.9 % (ref 39.0–52.0)
Hemoglobin: 14.2 g/dL (ref 13.0–17.0)
Immature Granulocytes: 0 %
Lymphocytes Relative: 30 %
Lymphs Abs: 2.7 10*3/uL (ref 0.7–4.0)
MCH: 26.9 pg (ref 26.0–34.0)
MCHC: 32.3 g/dL (ref 30.0–36.0)
MCV: 83.1 fL (ref 80.0–100.0)
Monocytes Absolute: 0.7 10*3/uL (ref 0.1–1.0)
Monocytes Relative: 7 %
Neutro Abs: 4.2 10*3/uL (ref 1.7–7.7)
Neutrophils Relative %: 47 %
Platelets: 306 10*3/uL (ref 150–400)
RBC: 5.28 MIL/uL (ref 4.22–5.81)
RDW: 12.6 % (ref 11.5–15.5)
WBC: 9 10*3/uL (ref 4.0–10.5)
nRBC: 0 % (ref 0.0–0.2)

## 2018-11-16 LAB — COMPREHENSIVE METABOLIC PANEL
ALK PHOS: 76 U/L (ref 38–126)
ALT: 18 U/L (ref 0–44)
AST: 18 U/L (ref 15–41)
Albumin: 4.5 g/dL (ref 3.5–5.0)
Anion gap: 6 (ref 5–15)
BILIRUBIN TOTAL: 0.3 mg/dL (ref 0.3–1.2)
BUN: 16 mg/dL (ref 6–20)
CO2: 26 mmol/L (ref 22–32)
Calcium: 9.2 mg/dL (ref 8.9–10.3)
Chloride: 105 mmol/L (ref 98–111)
Creatinine, Ser: 1.07 mg/dL (ref 0.61–1.24)
GFR calc Af Amer: >60 mL/min (ref >60)
Glucose, Bld: 110 mg/dL — ABNORMAL HIGH (ref 70–99)
Potassium: 3.8 mmol/L (ref 3.5–5.1)
Sodium: 137 mmol/L (ref 135–145)
TOTAL PROTEIN: 7.5 g/dL (ref 6.5–8.1)

## 2018-11-16 LAB — INFLUENZA PANEL BY PCR (TYPE A & B)
Influenza A By PCR: NEGATIVE
Influenza B By PCR: NEGATIVE

## 2018-11-16 MED ORDER — ALBUTEROL SULFATE (2.5 MG/3ML) 0.083% IN NEBU
10.0000 mg/h | INHALATION_SOLUTION | RESPIRATORY_TRACT | Status: DC
  Administered 2018-11-16 (×2): 10 mg/h via RESPIRATORY_TRACT
  Filled 2018-11-16: qty 12

## 2018-11-16 MED ORDER — METHYLPREDNISOLONE SODIUM SUCC 125 MG IJ SOLR
125.0000 mg | Freq: Once | INTRAMUSCULAR | Status: AC
  Administered 2018-11-16: 125 mg via INTRAMUSCULAR
  Filled 2018-11-16: qty 2

## 2018-11-16 MED ORDER — MAGNESIUM SULFATE 2 GM/50ML IV SOLN
2.0000 g | Freq: Once | INTRAVENOUS | Status: AC
  Administered 2018-11-16: 2 g via INTRAVENOUS
  Filled 2018-11-16: qty 50

## 2018-11-16 MED ORDER — ALBUTEROL SULFATE (2.5 MG/3ML) 0.083% IN NEBU
5.0000 mg | INHALATION_SOLUTION | Freq: Once | RESPIRATORY_TRACT | Status: AC
  Administered 2018-11-16: 5 mg via RESPIRATORY_TRACT
  Filled 2018-11-16: qty 6

## 2018-11-16 MED ORDER — IPRATROPIUM-ALBUTEROL 0.5-2.5 (3) MG/3ML IN SOLN
6.0000 mL | Freq: Once | RESPIRATORY_TRACT | Status: AC
  Administered 2018-11-16: 6 mL via RESPIRATORY_TRACT
  Filled 2018-11-16: qty 6

## 2018-11-16 MED ORDER — SODIUM CHLORIDE 0.9 % IV BOLUS
1000.0000 mL | Freq: Once | INTRAVENOUS | Status: AC
  Administered 2018-11-16: 1000 mL via INTRAVENOUS

## 2018-11-16 NOTE — ED Notes (Signed)
Pt states that he has had some swelling in his throat for the last couple of days as well as a strong non-productive cough. Pt states he has hx of asthma that he has inhaler and nebulizer that he used today without any relief. Pt in NAD at this time.

## 2018-11-16 NOTE — ED Provider Notes (Signed)
[facility] Emergency Department Provider Note  ____________________________________________  Time seen: Approximately 8:42 PM  I have reviewed the triage vital signs and the nursing notes.   HISTORY  Chief Complaint Asthma    HPI John Schwartz is a 23 y.o. male who presents the emergency department complaining of shortness of breath, chest tightness, nasal congestion, cough, fevers and chills.  Patient reports that he has URI symptoms over the past 2 to 3 days with worsening asthma.  Patient takes both daily inhaler of Advair as well as rescue inhaler as needed.   Patient has been using his rescue inhaler every 2 hours for increased symptoms.  Patient was concerned as he cannot seem to get his asthma under control.  No headache, neck pain or stiffness, abdominal pain, nausea or vomiting.  Patient does have a significant history of asthma.     Past Medical History:  Diagnosis Date  . Asthma   . Bronchospasm     Patient Active Problem List   Diagnosis Date Noted  . Asthma with status asthmaticus 06/04/2016  . Acute hypokalemia 06/04/2016  . Noncompliance with medication treatment due to underuse of medication 06/04/2016  . Acute renal insufficiency 06/04/2016  . Acute asthma exacerbation 06/04/2016  . Dyspnea 03/16/2016  . Asthma exacerbation 03/16/2016  . Status asthmaticus 02/11/2014  . Acute respiratory failure (HCC) 02/11/2014    Past Surgical History:  Procedure Laterality Date  . NO PAST SURGERIES      Prior to Admission medications   Medication Sig Start Date End Date Taking? Authorizing Provider  albuterol (PROVENTIL HFA;VENTOLIN HFA) 108 (90 Base) MCG/ACT inhaler Inhale 2 puffs into the lungs every 6 (six) hours as needed for wheezing or shortness of breath. 08/31/18   Myrna Blazer, MD  albuterol (PROVENTIL) (2.5 MG/3ML) 0.083% nebulizer solution Take 3 mLs (2.5 mg total) by nebulization every 4 (four) hours as needed for  wheezing or shortness of breath. 08/31/18   Schaevitz, Myra Rude, MD  amoxicillin (AMOXIL) 500 MG capsule Take 1 capsule (500 mg total) by mouth 3 (three) times daily. 09/14/18   Joni Reining, PA-C  benzonatate (TESSALON PERLES) 100 MG capsule Take 1 capsule (100 mg total) by mouth 3 (three) times daily as needed for cough. 09/06/18   Joni Reining, PA-C  fexofenadine-pseudoephedrine (ALLEGRA-D) 60-120 MG 12 hr tablet Take 1 tablet by mouth 2 (two) times daily. 09/14/18   Joni Reining, PA-C  Fluticasone-Salmeterol (ADVAIR DISKUS) 100-50 MCG/DOSE AEPB Inhale 1 puff into the lungs 2 (two) times daily. 05/26/18 05/26/19  Enid Derry, PA-C  ibuprofen (ADVIL,MOTRIN) 800 MG tablet Take 1 tablet (800 mg total) by mouth every 8 (eight) hours as needed for moderate pain. 09/14/18   Joni Reining, PA-C  ipratropium-albuterol (DUONEB) 0.5-2.5 (3) MG/3ML SOLN Take 3 mLs by nebulization every 4 (four) hours as needed. 05/07/18   Faythe Ghee, PA-C  methylPREDNISolone (MEDROL DOSEPAK) 4 MG TBPK tablet Take Tapered dose as directed 09/06/18   Joni Reining, PA-C  naproxen (NAPROSYN) 500 MG tablet Take 1 tablet (500 mg total) by mouth 2 (two) times daily with a meal. 08/10/18   Joni Reining, PA-C  neomycin-polymyxin-pramoxine (NEOSPORIN PLUS) 1 % cream Apply topically 2 (two) times daily. 08/10/18   Joni Reining, PA-C    Allergies Patient has no known allergies.  Family History  Problem Relation Age of Onset  . Arthritis Paternal Grandmother     Social History Social History   Tobacco  Use  . Smoking status: Never Smoker  . Smokeless tobacco: Never Used  Substance Use Topics  . Alcohol use: No  . Drug use: Not Currently    Types: Marijuana    Comment: Hx of smoking marajauna, last used over a year ago     Review of Systems  Constitutional: Positive fever/chills Eyes: No visual changes. No discharge ENT: Positive for nasal congestion and sore throat Cardiovascular: no chest  pain. Respiratory: Positive cough.  Positive for wheezing, shortness of breath, chest tightness Gastrointestinal: No abdominal pain.  No nausea, no vomiting.  No diarrhea.  No constipation. Musculoskeletal: Negative for musculoskeletal pain. Skin: Negative for rash, abrasions, lacerations, ecchymosis. Neurological: Negative for headaches, focal weakness or numbness. 10-point ROS otherwise negative.  ____________________________________________   PHYSICAL EXAM:  VITAL SIGNS: ED Triage Vitals  Enc Vitals Group     BP 11/16/18 1940 135/71     Pulse Rate 11/16/18 1940 75     Resp 11/16/18 1940 18     Temp 11/16/18 1940 98.6 F (37 C)     Temp Source 11/16/18 1940 Oral     SpO2 11/16/18 1940 97 %     Weight 11/16/18 1942 165 lb (74.8 kg)     Height 11/16/18 1942 5\' 10"  (1.778 m)     Head Circumference --      Peak Flow --      Pain Score 11/16/18 1940 7     Pain Loc --      Pain Edu? --      Excl. in GC? --      Constitutional: Alert and oriented. Well appearing and in no acute distress. Eyes: Conjunctivae are normal. PERRL. EOMI. Head: Atraumatic. ENT:      Ears: EACs and TMs unremarkable bilaterally.      Nose: Minimal clear congestion/rhinnorhea.      Mouth/Throat: Mucous membranes are moist.  Oropharynx is nonerythematous and nonedematous.  Uvula is midline. Neck: No stridor.  Hematological/Lymphatic/Immunilogical: No cervical lymphadenopathy. Cardiovascular: Normal rate, regular rhythm. Normal S1 and S2.  Good peripheral circulation. Respiratory: Normal respiratory effort without tachypnea or retractions. Lungs with diffuse inspiratory and expiratory wheezing bilaterally.  No rales or rhonchi.Peri Jefferson air entry to the bases with no decreased or absent breath sounds.  Musculoskeletal: Full range of motion to all extremities. No gross deformities appreciated. Neurologic:  Normal speech and language. No gross focal neurologic deficits are appreciated.  Skin:  Skin is warm,  dry and intact. No rash noted. Psychiatric: Mood and affect are normal. Speech and behavior are normal. Patient exhibits appropriate insight and judgement.   ____________________________________________   LABS (all labs ordered are listed, but only abnormal results are displayed)  Labs Reviewed  COMPREHENSIVE METABOLIC PANEL - Abnormal; Notable for the following components:      Result Value   Glucose, Bld 110 (*)    All other components within normal limits  CBC WITH DIFFERENTIAL/PLATELET - Abnormal; Notable for the following components:   Eosinophils Absolute 1.4 (*)    All other components within normal limits  RESPIRATORY PANEL BY PCR  INFLUENZA PANEL BY PCR (TYPE A & B)   ____________________________________________  EKG  ED ECG REPORT I, Delorise Royals Bailea Beed,  personally viewed and interpreted this ECG.   Date: 11/16/2018  EKG Time: 1939 hrs.  Rate: 67 bpm  Rhythm: normal sinus rhythm, nonspecific ST and T waves changes, with sinus arrhythmia  Axis: Rightward axis deviation  Intervals:none  ST&T Change: Nonspecific changes.  No contiguous  ST elevation.  No STEMI.  ____________________________________________  RADIOLOGY I personally viewed and evaluated these images as part of my medical decision making, as well as reviewing the written report by the radiologist.  Dg Chest 2 View  Result Date: 11/16/2018 CLINICAL DATA:  23 year old male with a history of asthma EXAM: CHEST - 2 VIEW COMPARISON:  09/06/2018 FINDINGS: Cardiomediastinal silhouette within normal limits. No evidence of central vascular congestion. No pneumothorax or pleural effusion. No confluent airspace disease. No acute bony abnormality IMPRESSION: No radiographic evidence of acute cardiopulmonary disease Electronically Signed   By: Gilmer MorJaime  Wagner D.O.   On: 11/16/2018 20:59    ____________________________________________    PROCEDURES  Procedure(s) performed:    Procedures    Medications   sodium chloride 0.9 % bolus 1,000 mL (1,000 mLs Intravenous New Bag/Given 11/16/18 2150)  albuterol (PROVENTIL) (2.5 MG/3ML) 0.083% nebulizer solution (has no administration in time range)  albuterol (PROVENTIL) (2.5 MG/3ML) 0.083% nebulizer solution 5 mg (5 mg Nebulization Given 11/16/18 2027)  ipratropium-albuterol (DUONEB) 0.5-2.5 (3) MG/3ML nebulizer solution 6 mL (6 mLs Nebulization Given 11/16/18 2104)  methylPREDNISolone sodium succinate (SOLU-MEDROL) 125 mg/2 mL injection 125 mg (125 mg Intramuscular Given 11/16/18 2103)  magnesium sulfate IVPB 2 g 50 mL (0 g Intravenous Stopped 11/16/18 2153)  ipratropium-albuterol (DUONEB) 0.5-2.5 (3) MG/3ML nebulizer solution 6 mL (6 mLs Nebulization Given 11/16/18 2150)     ____________________________________________   INITIAL IMPRESSION / ASSESSMENT AND PLAN / ED COURSE  Pertinent labs & imaging results that were available during my care of the patient were reviewed by me and considered in my medical decision making (see chart for details).  Review of the [place] CSRS was performed in accordance of the [facility] prior to dispensing any controlled drugs.  Clinical Course as of Nov 16 2249  Wed Nov 16, 2018  2141 After 3 albuterol and DuoNeb treatments, patient has had no improvement in wheezing on auscultation..  Patient denies any symptomatic improvement.  Chest x-ray reveals no indication of pneumonia.  All symptoms appear viral in nature with acute asthma exacerbation, patient has had respiratory failure with asthma with status asthmaticus.  As such, with no improvement after steroid, multiple breathing treatments, I will repeat breathing treatments and add magnesium sulfate.  Basic labs including influenza test will be ordered.    [JC]  2142 Patient reports that he has been intubated several times for his asthma.  Last intubation was approximately 3 years ago.   [JC]    Clinical Course User Index [JC] Meghen Akopyan, Delorise RoyalsJonathan D, PA-C       Patient's  diagnosis is consistent with asthma exacerbation viral upper respiratory infection.  Patient presents emergency department with mild URI symptoms and complaints of increased asthma symptoms.  Patient had been using his daily Advair as well as his rescue inhaler every 2 hours.  On initial exam, patient had mild increased work of breathing but no retractions or belly breathing, significant inspiratory and expiratory wheezing.  After multiple breathing treatments, Solu-Medrol, magnesium, patient had no symptomatic improvement and no improvement on auscultation.  Patient's labs are overall reassuring.  Negative chest x-ray.  At this time, influenza test was pending.  Given no improvement of symptoms after treatment and patient's history of significant asthma complications, I discussed the case with hospitalist service for admission.  They recommend acquiring respiratory panel as well as an ABG but were agreeable to admission.  Patient is placed on a continuous neb treatment while in the emergency department.  Patient care will  be transferred to hospitalist service for further management.      ____________________________________________  FINAL CLINICAL IMPRESSION(S) / ED DIAGNOSES  Final diagnoses:  Mild intermittent asthma with exacerbation  Viral upper respiratory tract infection      NEW MEDICATIONS STARTED DURING THIS VISIT:  ED Discharge Orders    None          This chart was dictated using voice recognition software/Dragon. Despite best efforts to proofread, errors can occur which can change the meaning. Any change was purely unintentional.    Racheal PatchesCuthriell, Dowell Hoon D, PA-C 11/16/18 2251    Minna AntisPaduchowski, Kevin, MD 11/16/18 2326

## 2018-11-16 NOTE — Progress Notes (Signed)
Pt refused ABG, He is in no apparent respiratory distress at this time, Inspiratory and expiratory wheezes noted, Sats are well on RA

## 2018-11-16 NOTE — ED Notes (Signed)
Pt resting quietly in bed. No acute distress. VSS

## 2018-11-16 NOTE — ED Triage Notes (Signed)
Pt in via POV, reports asthma flare up over last 2 days, unrelieved by home nebulizers and inhalers.  Pt ambulatory to triage, vitals WDL, NAD noted at this time.

## 2018-11-17 LAB — RESPIRATORY PANEL BY PCR

## 2018-11-17 LAB — CBC
HEMATOCRIT: 43 % (ref 39.0–52.0)
Hemoglobin: 14.2 g/dL (ref 13.0–17.0)
MCH: 27.5 pg (ref 26.0–34.0)
MCHC: 33 g/dL (ref 30.0–36.0)
MCV: 83.3 fL (ref 80.0–100.0)
Platelets: 300 10*3/uL (ref 150–400)
RBC: 5.16 MIL/uL (ref 4.22–5.81)
RDW: 12.4 % (ref 11.5–15.5)
WBC: 6.7 10*3/uL (ref 4.0–10.5)
nRBC: 0 % (ref 0.0–0.2)

## 2018-11-17 LAB — BASIC METABOLIC PANEL
Anion gap: 7 (ref 5–15)
BUN: 15 mg/dL (ref 6–20)
CHLORIDE: 108 mmol/L (ref 98–111)
CO2: 22 mmol/L (ref 22–32)
Calcium: 9.4 mg/dL (ref 8.9–10.3)
Creatinine, Ser: 1.06 mg/dL (ref 0.61–1.24)
GFR calc Af Amer: >60 mL/min (ref >60)
GFR calc non Af Amer: >60 mL/min (ref >60)
Glucose, Bld: 203 mg/dL — ABNORMAL HIGH (ref 70–99)
Potassium: 4.3 mmol/L (ref 3.5–5.1)
Sodium: 137 mmol/L (ref 135–145)

## 2018-11-17 LAB — GLUCOSE, CAPILLARY: Glucose-Capillary: 114 mg/dL — ABNORMAL HIGH (ref 70–99)

## 2018-11-17 MED ORDER — SODIUM CHLORIDE 0.9 % IV SOLN
INTRAVENOUS | Status: DC
  Administered 2018-11-17 (×2): via INTRAVENOUS

## 2018-11-17 MED ORDER — DOXYCYCLINE HYCLATE 100 MG PO TABS
100.0000 mg | ORAL_TABLET | Freq: Two times a day (BID) | ORAL | Status: DC
  Administered 2018-11-17 – 2018-11-18 (×3): 100 mg via ORAL
  Filled 2018-11-17 (×3): qty 1

## 2018-11-17 MED ORDER — IPRATROPIUM-ALBUTEROL 0.5-2.5 (3) MG/3ML IN SOLN
3.0000 mL | Freq: Four times a day (QID) | RESPIRATORY_TRACT | Status: DC
  Administered 2018-11-17 – 2018-11-18 (×5): 3 mL via RESPIRATORY_TRACT
  Filled 2018-11-17 (×5): qty 3

## 2018-11-17 MED ORDER — ALBUTEROL SULFATE (2.5 MG/3ML) 0.083% IN NEBU: 2.5000 mg | INHALATION_SOLUTION | RESPIRATORY_TRACT | Status: DC | PRN

## 2018-11-17 MED ORDER — ENOXAPARIN SODIUM 40 MG/0.4ML ~~LOC~~ SOLN
40.0000 mg | SUBCUTANEOUS | Status: DC
  Administered 2018-11-17: 40 mg via SUBCUTANEOUS
  Filled 2018-11-17 (×2): qty 0.4

## 2018-11-17 MED ORDER — SODIUM CHLORIDE 0.9% FLUSH
3.0000 mL | Freq: Two times a day (BID) | INTRAVENOUS | Status: DC
  Administered 2018-11-17 – 2018-11-18 (×3): 3 mL via INTRAVENOUS

## 2018-11-17 MED ORDER — ONDANSETRON HCL 4 MG/2ML IJ SOLN: 4.0000 mg | Freq: Four times a day (QID) | INTRAMUSCULAR | Status: DC | PRN

## 2018-11-17 MED ORDER — GUAIFENESIN ER 600 MG PO TB12
600.0000 mg | ORAL_TABLET | Freq: Two times a day (BID) | ORAL | Status: DC
  Administered 2018-11-17 – 2018-11-18 (×3): 600 mg via ORAL
  Filled 2018-11-17 (×3): qty 1

## 2018-11-17 MED ORDER — BISACODYL 5 MG PO TBEC: 5.0000 mg | DELAYED_RELEASE_TABLET | Freq: Every day | ORAL | Status: DC | PRN

## 2018-11-17 MED ORDER — ACETAMINOPHEN 325 MG PO TABS: 650.0000 mg | ORAL_TABLET | Freq: Four times a day (QID) | ORAL | Status: DC | PRN

## 2018-11-17 MED ORDER — ONDANSETRON HCL 4 MG PO TABS: 4.0000 mg | ORAL_TABLET | Freq: Four times a day (QID) | ORAL | Status: DC | PRN

## 2018-11-17 MED ORDER — DOCUSATE SODIUM 100 MG PO CAPS
100.0000 mg | ORAL_CAPSULE | Freq: Two times a day (BID) | ORAL | Status: DC
  Administered 2018-11-17 (×2): 100 mg via ORAL
  Filled 2018-11-17 (×3): qty 1

## 2018-11-17 MED ORDER — METHYLPREDNISOLONE SODIUM SUCC 125 MG IJ SOLR
60.0000 mg | Freq: Four times a day (QID) | INTRAMUSCULAR | Status: DC
  Administered 2018-11-17 – 2018-11-18 (×6): 60 mg via INTRAVENOUS
  Filled 2018-11-17 (×6): qty 2

## 2018-11-17 MED ORDER — ACETAMINOPHEN 650 MG RE SUPP: 650.0000 mg | Freq: Four times a day (QID) | RECTAL | Status: DC | PRN

## 2018-11-17 MED ORDER — BUDESONIDE 0.25 MG/2ML IN SUSP
0.2500 mg | Freq: Two times a day (BID) | RESPIRATORY_TRACT | Status: DC
  Administered 2018-11-17 (×3): 0.25 mg via RESPIRATORY_TRACT
  Filled 2018-11-17 (×3): qty 2

## 2018-11-17 NOTE — H&P (Signed)
[facility] [place] [facility]   PATIENT NAME: John Schwartz    MR#:  [id number]  DATE OF BIRTH:  04/15/1996  DATE OF ADMISSION:  11/16/2018  PRIMARY CARE PHYSICIAN: Patient, No Pcp Per   REQUESTING/REFERRING PHYSICIAN: Emergency  CHIEF COMPLAINT:   Chief Complaint  Patient presents with  . Asthma    HISTORY OF PRESENT ILLNESS:  John Schwartz  is a 23 y.o. male with a known history of asthma presented to emergency room for evaluation of wheezing, sore throat, nasal congestion, cough with yellow sputum production.  Patient is feeling all the symptoms for last 2 to 3 days that is progressively worsening.  He started with sore throat and nasal congestion.  He has a history of bed asthma exacerbation required intubation.  He denies fever or chills.  No headache or neck stiffness.  No other complaints.  In emergency room he received multiple breathing treatments without much improvement.  He also received magnesium and hour-long albuterol.  Hospitalist team requested for admission.  PAST MEDICAL HISTORY:   Past Medical History:  Diagnosis Date  . Asthma   . Bronchospasm     PAST SURGICAL HISTORY:   Past Surgical History:  Procedure Laterality Date  . NO PAST SURGERIES      SOCIAL HISTORY:   Social History   Tobacco Use  . Smoking status: Never Smoker  . Smokeless tobacco: Never Used  Substance Use Topics  . Alcohol use: No    FAMILY HISTORY:   Family History  Problem Relation Age of Onset  . Arthritis Paternal Grandmother     DRUG ALLERGIES:  No Known Allergies  REVIEW OF SYSTEMS:   ROS -12 point review of system reviewed positive as per HPI otherwise negative.  MEDICATIONS AT HOME:   Prior to Admission medications   Medication Sig Start Date End Date Taking? Authorizing Provider  albuterol (PROVENTIL) (2.5 MG/3ML) 0.083% nebulizer solution Take 3 mLs (2.5 mg total) by nebulization every 4 (four) hours as needed for wheezing or  shortness of breath. Patient taking differently: Take 2.5 mg by nebulization 2 (two) times daily.  08/31/18  Yes Schaevitz, Myra Rude, MD  albuterol (PROVENTIL HFA;VENTOLIN HFA) 108 (90 Base) MCG/ACT inhaler Inhale 2 puffs into the lungs every 6 (six) hours as needed for wheezing or shortness of breath. Patient not taking: Reported on 11/16/2018 08/31/18   Myrna Blazer, MD  amoxicillin (AMOXIL) 500 MG capsule Take 1 capsule (500 mg total) by mouth 3 (three) times daily. Patient not taking: Reported on 11/16/2018 09/14/18   Joni Reining, PA-C  benzonatate (TESSALON PERLES) 100 MG capsule Take 1 capsule (100 mg total) by mouth 3 (three) times daily as needed for cough. Patient not taking: Reported on 11/16/2018 09/06/18   Joni Reining, PA-C  fexofenadine-pseudoephedrine (ALLEGRA-D) 60-120 MG 12 hr tablet Take 1 tablet by mouth 2 (two) times daily. Patient not taking: Reported on 11/16/2018 09/14/18   Joni Reining, PA-C  Fluticasone-Salmeterol (ADVAIR DISKUS) 100-50 MCG/DOSE AEPB Inhale 1 puff into the lungs 2 (two) times daily. Patient not taking: Reported on 11/16/2018 05/26/18 05/26/19  Enid Derry, PA-C  ibuprofen (ADVIL,MOTRIN) 800 MG tablet Take 1 tablet (800 mg total) by mouth every 8 (eight) hours as needed for moderate pain. Patient not taking: Reported on 11/16/2018 09/14/18   Joni Reining, PA-C  ipratropium-albuterol (DUONEB) 0.5-2.5 (3) MG/3ML SOLN Take 3 mLs by nebulization every 4 (four) hours as needed. Patient not taking: Reported on 11/16/2018  05/07/18   Sherrie MustacheFisher, Roselyn BeringSusan W, PA-C  methylPREDNISolone (MEDROL DOSEPAK) 4 MG TBPK tablet Take Tapered dose as directed Patient not taking: Reported on 11/16/2018 09/06/18   Joni ReiningSmith, Ronald K, PA-C  naproxen (NAPROSYN) 500 MG tablet Take 1 tablet (500 mg total) by mouth 2 (two) times daily with a meal. Patient not taking: Reported on 11/16/2018 08/10/18   Joni ReiningSmith, Ronald K, PA-C  neomycin-polymyxin-pramoxine (NEOSPORIN PLUS) 1 % cream Apply  topically 2 (two) times daily. Patient not taking: Reported on 11/16/2018 08/10/18   Joni ReiningSmith, Ronald K, PA-C      VITAL SIGNS:  Blood pressure 139/82, pulse 86, temperature 98.6 F (37 C), temperature source Oral, resp. rate 18, height 5\' 10"  (1.778 m), weight 74.8 kg, SpO2 100 %.  PHYSICAL EXAMINATION:  Physical Exam  GENERAL:  23 y.o.-year-old patient lying in the bed with no acute distress.  EYES: Pupils equal, round, reactive to light and accommodation. No scleral icterus. Extraocular muscles intact.  HEENT: Head atraumatic, normocephalic. Oropharynx and nasopharynx clear.  NECK:  Supple, no jugular venous distention. No thyroid enlargement, no tenderness.  LUNGS: Bilateral expiratory wheezing CARDIOVASCULAR: S1, S2 normal. No murmurs, rubs, or gallops.  ABDOMEN: Soft, nontender, nondistended. Bowel sounds present. No organomegaly or mass.  EXTREMITIES: No pedal edema, cyanosis, or clubbing.  NEUROLOGIC: Cranial nerves II through XII are intact. Muscle strength 5/5 in all extremities. Sensation intact. Gait not checked.  PSYCHIATRIC: The patient is alert and oriented x 3.  SKIN: No obvious rash, lesion, or ulcer.   LABORATORY PANEL:   CBC Recent Labs  Lab 11/16/18 2151  WBC 9.0  HGB 14.2  HCT 43.9  PLT 306   ------------------------------------------------------------------------------------------------------------------  Chemistries  Recent Labs  Lab 11/16/18 2151  NA 137  K 3.8  CL 105  CO2 26  GLUCOSE 110*  BUN 16  CREATININE 1.07  CALCIUM 9.2  AST 18  ALT 18  ALKPHOS 76  BILITOT 0.3   ------------------------------------------------------------------------------------------------------------------  Cardiac Enzymes No results for input(s): TROPONINI in the last 168 hours. ------------------------------------------------------------------------------------------------------------------  RADIOLOGY:  Dg Chest 2 View  Result Date: 11/16/2018 CLINICAL DATA:   23 year old male with a history of asthma EXAM: CHEST - 2 VIEW COMPARISON:  09/06/2018 FINDINGS: Cardiomediastinal silhouette within normal limits. No evidence of central vascular congestion. No pneumothorax or pleural effusion. No confluent airspace disease. No acute bony abnormality IMPRESSION: No radiographic evidence of acute cardiopulmonary disease Electronically Signed   By: Gilmer MorJaime  Wagner D.O.   On: 11/16/2018 20:59      IMPRESSION AND PLAN:   1.  Acute on chronic asthma exacerbation: Patient started on IV steroid, jet nebs, Pulmicort and oxygen.  Titrate oxygen and medication as indicated.  Symptomatic treatment is indicated.  2.  Acute bronchitis/pharyngitis: Patient started on doxycycline.  Symptomatic treatment  DVT prophylaxis: Lovenox subcutaneous  Estimated length of stay less than 2 midnight  Moderate to high risk secondary to above  Further treatment based on clinical course.  Case discussed with patient and family who is in agreement.  All the records are reviewed and case discussed with ED provider. Management plans discussed with the patient, family and they are in agreement.  CODE STATUS: Full  TOTAL TIME TAKING CARE OF THIS PATIENT: 30 minutes.    Montez MoritaJitendra Rayven Rettig M.D on 11/17/2018 at 1:22 AM  Between 7am to 6pm - Pager - [phone]  After 6pm go to [email or web address] - Social research officer, [email or web address] [email or web address]  [employer] [facility] [place] [facility]  Office  [phone]  CC: Primary care physician; Patient,  No Pcp Per

## 2018-11-17 NOTE — ED Notes (Signed)
Admitting MD to bedside 

## 2018-11-17 NOTE — Progress Notes (Signed)
[facility] [place] [facility]   PATIENT NAME: John SousRaheim Eid    MR#:  [id number]  DATE OF BIRTH:  1996-10-21  SUBJECTIVE:  CHIEF COMPLAINT: Patient shortness of breath is little bit better than before.  Still coughing.  REVIEW OF SYSTEMS:  CONSTITUTIONAL: No fever, fatigue or weakness.  EYES: No blurred or double vision.  EARS, NOSE, AND THROAT: No tinnitus or ear pain.  RESPIRATORY: Reporting intermittent episodes of cough, shortness of breath, wheezing, denies hemoptysis.  CARDIOVASCULAR: No chest pain, orthopnea, edema.  GASTROINTESTINAL: No nausea, vomiting, diarrhea or abdominal pain.  GENITOURINARY: No dysuria, hematuria.  ENDOCRINE: No polyuria, nocturia,  HEMATOLOGY: No anemia, easy bruising or bleeding SKIN: No rash or lesion. MUSCULOSKELETAL: No joint pain or arthritis.   NEUROLOGIC: No tingling, numbness, weakness.  PSYCHIATRY: No anxiety or depression.   DRUG ALLERGIES:  No Known Allergies  VITALS:  Blood pressure 130/62, pulse 65, temperature 98.2 F (36.8 C), temperature source Oral, resp. rate 16, height 5\' 10"  (1.778 m), weight 80.6 kg, SpO2 100 %.  PHYSICAL EXAMINATION:  GENERAL:  23 y.o.-year-old patient lying in the bed with no acute distress.  EYES: Pupils equal, round, reactive to light and accommodation. No scleral icterus. Extraocular muscles intact.  HEENT: Head atraumatic, normocephalic. Oropharynx and nasopharynx clear.  NECK:  Supple, no jugular venous distention. No thyroid enlargement, no tenderness.  LUNGS: Moderate breath sounds bilaterally, minimal diffuse wheezing, no rales,rhonchi or crepitation. No use of accessory muscles of respiration.  CARDIOVASCULAR: S1, S2 normal. No murmurs, rubs, or gallops.  ABDOMEN: Soft, nontender, nondistended. Bowel sounds present. No organomegaly or mass.  EXTREMITIES: No pedal edema, cyanosis, or clubbing.  NEUROLOGIC: Cranial nerves II through XII are intact. Muscle strength 5/5  in all extremities. Sensation intact. Gait not checked.  PSYCHIATRIC: The patient is alert and oriented x 3.  SKIN: No obvious rash, lesion, or ulcer.    LABORATORY PANEL:   CBC Recent Labs  Lab 11/17/18 0423  WBC 6.7  HGB 14.2  HCT 43.0  PLT 300   ------------------------------------------------------------------------------------------------------------------  Chemistries  Recent Labs  Lab 11/16/18 2151 11/17/18 0423  NA 137 137  K 3.8 4.3  CL 105 108  CO2 26 22  GLUCOSE 110* 203*  BUN 16 15  CREATININE 1.07 1.06  CALCIUM 9.2 9.4  AST 18  --   ALT 18  --   ALKPHOS 76  --   BILITOT 0.3  --    ------------------------------------------------------------------------------------------------------------------  Cardiac Enzymes No results for input(s): TROPONINI in the last 168 hours. ------------------------------------------------------------------------------------------------------------------  RADIOLOGY:  Dg Chest 2 View  Result Date: 11/16/2018 CLINICAL DATA:  23 year old male with a history of asthma EXAM: CHEST - 2 VIEW COMPARISON:  09/06/2018 FINDINGS: Cardiomediastinal silhouette within normal limits. No evidence of central vascular congestion. No pneumothorax or pleural effusion. No confluent airspace disease. No acute bony abnormality IMPRESSION: No radiographic evidence of acute cardiopulmonary disease Electronically Signed   By: Gilmer MorJaime  Wagner D.O.   On: 11/16/2018 20:59    EKG:   Orders placed or performed during the hospital encounter of 11/16/18  . EKG 12-Lead  . EKG 12-Lead  . EKG    ASSESSMENT AND PLAN:   1.  Acute on chronic asthma exacerbation:  Continue IV steroids, nebulizer treatments, oxygen and wean off as tolerated   Symptomatic treatment is indicated.  2.  Acute bronchitis/pharyngitis: Patient started on doxycycline.  Symptomatic treatment Flu test is negative, respiratory panel negative  3.  Steroid-induced hyperglycemia Sliding  scale as needed  DVT prophylaxis: Lovenox subcutaneous      All the records are reviewed and case discussed with Care Management/Social Workerr. Management plans discussed with the patient, family and they are in agreement.  CODE STATUS: fc   TOTAL TIME TAKING CARE OF THIS PATIENT: 33 minutes.   POSSIBLE D/C IN 1-2 DAYS, DEPENDING ON CLINICAL CONDITION.  Note: This dictation was prepared with Dragon dictation along with smaller phrase technology. Any transcriptional errors that result from this process are unintentional.   Ramonita LabAruna Berline Semrad M.D on 11/17/2018 at 2:51 PM  Between 7am to 6pm - Pager - [phone] After 6pm go to [email or web address] - password [email or web address]  [facility] [place] [facility]  Office  [phone]  CC: Primary care physician; Patient, No Pcp Per

## 2018-11-17 NOTE — ED Notes (Signed)
Pt reports improvement to breathing. Pt resting quietly in bed. No distress noted.

## 2018-11-18 LAB — GLUCOSE, CAPILLARY
Glucose-Capillary: 193 mg/dL — ABNORMAL HIGH (ref 70–99)
Glucose-Capillary: 157 mg/dL — ABNORMAL HIGH (ref 70–99)

## 2018-11-18 MED ORDER — BENZONATATE 100 MG PO CAPS: 100.0000 mg | ORAL_CAPSULE | Freq: Three times a day (TID) | ORAL | 0 refills | Status: DC | PRN

## 2018-11-18 MED ORDER — INSULIN ASPART 100 UNIT/ML ~~LOC~~ SOLN: 0.0000 [IU] | Freq: Three times a day (TID) | SUBCUTANEOUS | Status: DC

## 2018-11-18 MED ORDER — ALBUTEROL SULFATE (2.5 MG/3ML) 0.083% IN NEBU: 2.5000 mg | INHALATION_SOLUTION | RESPIRATORY_TRACT | 0 refills | Status: DC | PRN

## 2018-11-18 MED ORDER — ALBUTEROL SULFATE HFA 108 (90 BASE) MCG/ACT IN AERS: 2.0000 | INHALATION_SPRAY | Freq: Four times a day (QID) | RESPIRATORY_TRACT | 1 refills | Status: DC | PRN

## 2018-11-18 MED ORDER — DOXYCYCLINE HYCLATE 100 MG PO TABS: 100.0000 mg | ORAL_TABLET | Freq: Two times a day (BID) | ORAL | 0 refills | Status: DC

## 2018-11-18 MED ORDER — PREDNISONE 10 MG (21) PO TBPK: 10.0000 mg | ORAL_TABLET | Freq: Every day | ORAL | 0 refills | Status: DC

## 2018-11-18 MED ORDER — FLUTICASONE-SALMETEROL 100-50 MCG/DOSE IN AEPB: 1.0000 | INHALATION_SPRAY | Freq: Two times a day (BID) | RESPIRATORY_TRACT | 0 refills | Status: DC

## 2018-11-18 MED ORDER — INSULIN ASPART 100 UNIT/ML ~~LOC~~ SOLN: 0.0000 [IU] | Freq: Every day | SUBCUTANEOUS | Status: DC

## 2018-11-18 MED ORDER — IPRATROPIUM-ALBUTEROL 0.5-2.5 (3) MG/3ML IN SOLN: 3.0000 mL | RESPIRATORY_TRACT | 0 refills | Status: DC | PRN

## 2018-11-18 NOTE — Discharge Summary (Signed)
[facility] [place] at [facility]   PATIENT NAME: John Schwartz    MR#:  [id number]  DATE OF BIRTH:  [birth year]  DATE OF ADMISSION:  11/16/2018 ADMITTING PHYSICIAN: Montez Morita, MD  DATE OF DISCHARGE:  11/18/18  PRIMARY CARE PHYSICIAN: Patient, No Pcp Per    ADMISSION DIAGNOSIS:  Mild intermittent asthma with exacerbation [J45.21] Viral upper respiratory tract infection [J06.9]  DISCHARGE DIAGNOSIS:  Active Problems:   Asthma exacerbation   SECONDARY DIAGNOSIS:   Past Medical History:  Diagnosis Date  . Asthma   . Bronchospasm     HOSPITAL COURSE:  John Schwartz  is a 23 y.o. male with a known history of asthma presented to emergency room for evaluation of wheezing, sore throat, nasal congestion, cough with yellow sputum production.  Patient is feeling all the symptoms for last 2 to 3 days that is progressively worsening.  He started with sore throat and nasal congestion.  He has a history of bed asthma exacerbation required intubation.  He denies fever or chills.  No headache or neck stiffness.  No other complaints.  In emergency room he received multiple breathing treatments without much improvement.  He also received magnesium and hour-long albuterol.  Hospitalist team requested for admission.   1.Acute on chronic asthma exacerbation: Clinically improved with IV steroids and nebulizer treatments.  Weaned off oxygen.  Will discharge patient home with p.o. steroids and patient is to continue nebulizer treatments as needed  2.Acute bronchitis/pharyngitis:Patient started on doxycycline.  Clinically much better will discharge patient home with doxycycline Flu test is negative, respiratory panel negative  3.  Steroid-induced hyperglycemia Sliding scale as needed provided during the hospital course  DVT prophylaxis:Lovenox subcutaneous  DISCHARGE CONDITIONS:   Stable  CONSULTS OBTAINED:     PROCEDURES none  DRUG ALLERGIES:  No  Known Allergies  DISCHARGE MEDICATIONS:   Allergies as of 11/18/2018   No Known Allergies     Medication List    STOP taking these medications   amoxicillin 500 MG capsule Commonly known as:  AMOXIL   fexofenadine-pseudoephedrine 60-120 MG 12 hr tablet Commonly known as:  ALLEGRA-D   ibuprofen 800 MG tablet Commonly known as:  ADVIL,MOTRIN   methylPREDNISolone 4 MG Tbpk tablet Commonly known as:  MEDROL DOSEPAK   naproxen 500 MG tablet Commonly known as:  NAPROSYN   neomycin-polymyxin-pramoxine 1 % cream Commonly known as:  NEOSPORIN PLUS     TAKE these medications   albuterol 108 (90 Base) MCG/ACT inhaler Commonly known as:  PROVENTIL HFA;VENTOLIN HFA Inhale 2 puffs into the lungs every 6 (six) hours as needed for wheezing or shortness of breath. What changed:  Another medication with the same name was changed. Make sure you understand how and when to take each.   albuterol (2.5 MG/3ML) 0.083% nebulizer solution Commonly known as:  PROVENTIL Take 3 mLs (2.5 mg total) by nebulization every 4 (four) hours as needed for wheezing or shortness of breath. What changed:    when to take this  reasons to take this   benzonatate 100 MG capsule Commonly known as:  TESSALON PERLES Take 1 capsule (100 mg total) by mouth 3 (three) times daily as needed for cough.   doxycycline 100 MG tablet Commonly known as:  VIBRA-TABS Take 1 tablet (100 mg total) by mouth every 12 (twelve) hours.   Fluticasone-Salmeterol 100-50 MCG/DOSE Aepb Commonly known as:  ADVAIR DISKUS Inhale 1 puff into the lungs 2 (two) times daily.   ipratropium-albuterol 0.5-2.5 (3)  MG/3ML Soln Commonly known as:  DUONEB Take 3 mLs by nebulization every 4 (four) hours as needed.   predniSONE 10 MG (21) Tbpk tablet Commonly known as:  STERAPRED UNI-PAK 21 TAB Take 1 tablet (10 mg total) by mouth daily. Take 6 tablets by mouth for 1 day followed by  5 tablets by mouth for 1 day followed by  4 tablets by  mouth for 1 day followed by  3 tablets by mouth for 1 day followed by  2 tablets by mouth for 1 day followed by  1 tablet by mouth for a day and stop        DISCHARGE INSTRUCTIONS:   Follow-up with primary care physician in 4 to 5 days  DIET:  Regular diet  DISCHARGE CONDITION:  Stable  ACTIVITY:  Activity as tolerated  OXYGEN:  Home Oxygen: No.   Oxygen Delivery: room air  DISCHARGE LOCATION:  home   If you experience worsening of your admission symptoms, develop shortness of breath, life threatening emergency, suicidal or homicidal thoughts you must seek medical attention immediately by calling 911 or calling your MD immediately  if symptoms less severe.  You Must read complete instructions/literature along with all the possible adverse reactions/side effects for all the Medicines you take and that have been prescribed to you. Take any new Medicines after you have completely understood and accpet all the possible adverse reactions/side effects.   Please note  You were cared for by a hospitalist during your hospital stay. If you have any questions about your discharge medications or the care you received while you were in the hospital after you are discharged, you can call the unit and asked to speak with the hospitalist on call if the hospitalist that took care of you is not available. Once you are discharged, your primary care physician will handle any further medical issues. Please note that NO REFILLS for any discharge medications will be authorized once you are discharged, as it is imperative that you return to your primary care physician (or establish a relationship with a primary care physician if you do not have one) for your aftercare needs so that they can reassess your need for medications and monitor your lab values.     Today  Chief Complaint  Patient presents with  . Asthma   Patient is feeling much better.  Ambulating in the room without any shortness of  breath.  Intermittent episodes of cough which is also improving and wants to go home.  ROS:  CONSTITUTIONAL: Denies fevers, chills. Denies any fatigue, weakness.  EYES: Denies blurry vision, double vision, eye pain. EARS, NOSE, THROAT: Denies tinnitus, ear pain, hearing loss. RESPIRATORY: Improving cough, denies wheeze, shortness of breath.  CARDIOVASCULAR: Denies chest pain, palpitations, edema.  GASTROINTESTINAL: Denies nausea, vomiting, diarrhea, abdominal pain. Denies bright red blood per rectum. GENITOURINARY: Denies dysuria, hematuria. ENDOCRINE: Denies nocturia or thyroid problems. HEMATOLOGIC AND LYMPHATIC: Denies easy bruising or bleeding. SKIN: Denies rash or lesion. MUSCULOSKELETAL: Denies pain in neck, back, shoulder, knees, hips or arthritic symptoms.  NEUROLOGIC: Denies paralysis, paresthesias.  PSYCHIATRIC: Denies anxiety or depressive symptoms.   VITAL SIGNS:  Blood pressure (!) 106/51, pulse 84, temperature 97.7 F (36.5 C), temperature source Oral, resp. rate 18, height 5\' 10"  (1.778 m), weight 80.6 kg, SpO2 97 %.  I/O:    Intake/Output Summary (Last 24 hours) at 11/18/2018 1144 Last data filed at 11/18/2018 0609 Gross per 24 hour  Intake -  Output 400 ml  Net -400  ml    PHYSICAL EXAMINATION:  GENERAL:  23 y.o.-year-old patient lying in the bed with no acute distress.  EYES: Pupils equal, round, reactive to light and accommodation. No scleral icterus. Extraocular muscles intact.  HEENT: Head atraumatic, normocephalic. Oropharynx and nasopharynx clear.  NECK:  Supple, no jugular venous distention. No thyroid enlargement, no tenderness.  LUNGS: Normal breath sounds bilaterally, no wheezing, rales,rhonchi or crepitation. No use of accessory muscles of respiration.  CARDIOVASCULAR: S1, S2 normal. No murmurs, rubs, or gallops.  ABDOMEN: Soft, non-tender, non-distended. Bowel sounds present. No organomegaly or mass.  EXTREMITIES: No pedal edema, cyanosis, or clubbing.   NEUROLOGIC: Cranial nerves II through XII are intact. Muscle strength 5/5 in all extremities. Sensation intact. Gait not checked.  PSYCHIATRIC: The patient is alert and oriented x 3.  SKIN: No obvious rash, lesion, or ulcer.   DATA REVIEW:   CBC Recent Labs  Lab 11/17/18 0423  WBC 6.7  HGB 14.2  HCT 43.0  PLT 300    Chemistries  Recent Labs  Lab 11/16/18 2151 11/17/18 0423  NA 137 137  K 3.8 4.3  CL 105 108  CO2 26 22  GLUCOSE 110* 203*  BUN 16 15  CREATININE 1.07 1.06  CALCIUM 9.2 9.4  AST 18  --   ALT 18  --   ALKPHOS 76  --   BILITOT 0.3  --     Cardiac Enzymes No results for input(s): TROPONINI in the last 168 hours.  Microbiology Results  Results for orders placed or performed during the hospital encounter of 11/16/18  Respiratory Panel by PCR     Status: None   Collection Time: 11/16/18 10:39 PM  Result Value Ref Range Status   Adenovirus NOT DETECTED NOT DETECTED Final   Coronavirus 229E NOT DETECTED NOT DETECTED Final   Coronavirus HKU1 NOT DETECTED NOT DETECTED Final   Coronavirus NL63 NOT DETECTED NOT DETECTED Final   Coronavirus OC43 NOT DETECTED NOT DETECTED Final   Metapneumovirus NOT DETECTED NOT DETECTED Final   Rhinovirus / Enterovirus NOT DETECTED NOT DETECTED Final   Influenza A NOT DETECTED NOT DETECTED Final   Influenza B NOT DETECTED NOT DETECTED Final   Parainfluenza Virus 1 NOT DETECTED NOT DETECTED Final   Parainfluenza Virus 2 NOT DETECTED NOT DETECTED Final   Parainfluenza Virus 3 NOT DETECTED NOT DETECTED Final   Parainfluenza Virus 4 NOT DETECTED NOT DETECTED Final   Respiratory Syncytial Virus NOT DETECTED NOT DETECTED Final   Bordetella pertussis NOT DETECTED NOT DETECTED Final   Chlamydophila pneumoniae NOT DETECTED NOT DETECTED Final   Mycoplasma pneumoniae NOT DETECTED NOT DETECTED Final    Comment: Performed at [facility], [address] [place], [place] [address]    RADIOLOGY:  Dg Chest 2 View  Result  Date: 11/16/2018 CLINICAL DATA:  23 year old male with a history of asthma EXAM: CHEST - 2 VIEW COMPARISON:  09/06/2018 FINDINGS: Cardiomediastinal silhouette within normal limits. No evidence of central vascular congestion. No pneumothorax or pleural effusion. No confluent airspace disease. No acute bony abnormality IMPRESSION: No radiographic evidence of acute cardiopulmonary disease Electronically Signed   By: Gilmer Mor D.O.   On: 11/16/2018 20:59    EKG:   Orders placed or performed during the hospital encounter of 11/16/18  . EKG 12-Lead  . EKG 12-Lead  . EKG      Management plans discussed with the patient, he is  in agreement.  CODE STATUS:     Code Status Orders  (  From admission, onward)         Start     Ordered   11/17/18 0130  Full code  Continuous     11/17/18 0129        Code Status History    Date Active Date Inactive Code Status Order ID Comments User Context   03/04/2018 2104 03/06/2018 1315 Full Code [id number]  Enid BaasKalisetti, Radhika, MD Inpatient   11/01/2016 1502 11/02/2016 1656 Full Code [id number]  Shaune Pollackhen, Qing, MD ED   06/04/2016 0858 06/07/2016 2006 Full Code [id number]  Russella DarEllis, Allison L, NP Inpatient   03/16/2016 0330 03/17/2016 1426 Full Code [id number]  Ihor AustinPyreddy, Pavan, MD Inpatient   02/11/2014 1907 02/14/2014 2110 Full Code [id number]  Nash ShearerSmith, Cherrelle, MD ED      TOTAL TIME TAKING CARE OF THIS PATIENT: 43  minutes.   Note: This dictation was prepared with Dragon dictation along with smaller phrase technology. Any transcriptional errors that result from this process are unintentional.   @[facility] @  on 11/18/2018 at 11:44 AM  Between 7am to 6pm - Pager - [phone]  After 6pm go to [email or web address] - password [email or web address]  Fabio [facility]  Office  [phone]  CC: Primary care physician; Patient, No Pcp Per

## 2018-11-18 NOTE — Care Management Note (Signed)
Case Management Note  Patient Details  Name: John Schwartz MRN: [id number] Date of Birth: 1996-11-02   Patient to discharge today.  Patient states that he lives with his grandmother.  Patient states that he previously has been to [facility], however he hasn't been "anywhere in a while".  Patient states he normally gets prescriptions when he is in the hospital, then gets filled what he can from [place].  Patient agreeable for a new patient appointment to be set up at [facility].  Appointment scheduled for 11/21/18. RNCM confirmed with Medication Management  That they have all discharge prescriptions in stock.  Prescriptions faxed, and patient to pick up after discharge at no cost.  Patient states that he has a working nebulizer in the home.   Subjective/Objective:                    Action/Plan:   Expected Discharge Date:  11/18/18               Expected Discharge Plan:  Home/Self Care  In-House Referral:     Discharge planning Services  CM Consult, Indigent Health Clinic, Medication Assistance  Post Acute Care Choice:    Choice offered to:     DME Arranged:    DME Agency:     HH Arranged:    HH Agency:     Status of Service:  Completed, signed off  If discussed at [employer], dates discussed:    Additional Comments:  Chapman Fitch, RN 11/18/2018, 1:42 PM

## 2018-11-18 NOTE — Progress Notes (Signed)
Nsg Discharge Note  Admit Date:  11/16/2018 Discharge date: 11/18/2018   Nolawi O Wing to be D/C'd Home   per MD order.  AVS completed.  Copy for chart, and copy for patient signed, and dated. Patient/caregiver able to verbalize understanding.  Discharge Medication: Allergies as of 11/18/2018   No Known Allergies     Medication List    STOP taking these medications   amoxicillin 500 MG capsule Commonly known as:  AMOXIL   fexofenadine-pseudoephedrine 60-120 MG 12 hr tablet Commonly known as:  ALLEGRA-D   ibuprofen 800 MG tablet Commonly known as:  ADVIL,MOTRIN   methylPREDNISolone 4 MG Tbpk tablet Commonly known as:  MEDROL DOSEPAK   naproxen 500 MG tablet Commonly known as:  NAPROSYN   neomycin-polymyxin-pramoxine 1 % cream Commonly known as:  NEOSPORIN PLUS     TAKE these medications   albuterol 108 (90 Base) MCG/ACT inhaler Commonly known as:  PROVENTIL HFA;VENTOLIN HFA Inhale 2 puffs into the lungs every 6 (six) hours as needed for wheezing or shortness of breath. What changed:  Another medication with the same name was changed. Make sure you understand how and when to take each.   albuterol (2.5 MG/3ML) 0.083% nebulizer solution Commonly known as:  PROVENTIL Take 3 mLs (2.5 mg total) by nebulization every 4 (four) hours as needed for wheezing or shortness of breath. What changed:    when to take this  reasons to take this   benzonatate 100 MG capsule Commonly known as:  TESSALON PERLES Take 1 capsule (100 mg total) by mouth 3 (three) times daily as needed for cough.   doxycycline 100 MG tablet Commonly known as:  VIBRA-TABS Take 1 tablet (100 mg total) by mouth every 12 (twelve) hours.   Fluticasone-Salmeterol 100-50 MCG/DOSE Aepb Commonly known as:  ADVAIR DISKUS Inhale 1 puff into the lungs 2 (two) times daily.   ipratropium-albuterol 0.5-2.5 (3) MG/3ML Soln Commonly known as:  DUONEB Take 3 mLs by nebulization every 4 (four) hours as needed.    predniSONE 10 MG (21) Tbpk tablet Commonly known as:  STERAPRED UNI-PAK 21 TAB Take 1 tablet (10 mg total) by mouth daily. Take 6 tablets by mouth for 1 day followed by  5 tablets by mouth for 1 day followed by  4 tablets by mouth for 1 day followed by  3 tablets by mouth for 1 day followed by  2 tablets by mouth for 1 day followed by  1 tablet by mouth for a day and stop       Discharge Assessment: Vitals:   11/18/18 0602 11/18/18 1145  BP: (!) 106/51 126/79  Pulse: 84 (!) 55  Resp: 18   Temp: 97.7 F (36.5 C)   SpO2: 97% 98%   Skin clean, dry and intact without evidence of skin break down, no evidence of skin tears noted. IV catheter discontinued intact. Site without signs and symptoms of complications - no redness or edema noted at insertion site, patient denies c/o pain - only slight tenderness at site.  Dressing with slight pressure applied.  D/c Instructions-Education: Discharge instructions given to patient/family with verbalized understanding. D/c education completed with patient/family including follow up instructions, medication list, d/c activities limitations if indicated, with other d/c instructions as indicated by MD - patient able to verbalize understanding, all questions fully answered. Patient instructed to return to ED, call 911, or call MD for any changes in condition.  Patient escorted via WC, and D/C home via private auto.  Austin Miles  Perryton Bing, RN 11/18/2018 12:16 PM

## 2018-11-18 NOTE — Discharge Instructions (Signed)
Follow-up with primary care physician in 4 to 5 days    Asthma, Adult  Asthma is a long-term (chronic) condition in which the airways get tight and narrow. The airways are the breathing passages that lead from the nose and mouth down into the lungs. A person with asthma will have times when symptoms get worse. These are called asthma attacks. They can cause coughing, whistling sounds when you breathe (wheezing), shortness of breath, and chest pain. They can make it hard to breathe. There is no cure for asthma, but medicines and lifestyle changes can help control it. There are many things that can bring on an asthma attack or make asthma symptoms worse (triggers). Common triggers include:  Mold.  Dust.  Cigarette smoke.  Cockroaches.  Things that can cause allergy symptoms (allergens). These include animal skin flakes (dander) and pollen from trees or grass.  Things that pollute the air. These may include household cleaners, wood smoke, smog, or chemical odors.  Cold air, weather changes, and wind.  Crying or laughing hard.  Stress.  Certain medicines or drugs.  Certain foods such as dried fruit, potato chips, and grape juice.  Infections, such as a cold or the flu.  Certain medical conditions or diseases.  Exercise or tiring activities. Asthma may be treated with medicines and by staying away from the things that cause asthma attacks. Types of medicines may include:  Controller medicines. These help prevent asthma symptoms. They are usually taken every day.  Fast-acting reliever or rescue medicines. These quickly relieve asthma symptoms. They are used as needed and provide short-term relief.  Allergy medicines if your attacks are brought on by allergens.  Medicines to help control the body's defense (immune) system. Follow these instructions at home: Avoiding triggers in your home  Change your heating and air conditioning filter often.  Limit your use of fireplaces  and wood stoves.  Get rid of pests (such as roaches and mice) and their droppings.  Throw away plants if you see mold on them.  Clean your floors. Dust regularly. Use cleaning products that do not smell.  Have someone vacuum when you are not home. Use a vacuum cleaner with a HEPA filter if possible.  Replace carpet with wood, tile, or vinyl flooring. Carpet can trap animal skin flakes and dust.  Use allergy-proof pillows, mattress covers, and box spring covers.  Wash bed sheets and blankets every week in hot water. Dry them in a dryer.  Keep your bedroom free of any triggers.  Avoid pets and keep windows closed when things that cause allergy symptoms are in the air.  Use blankets that are made of polyester or cotton.  Clean bathrooms and kitchens with bleach. If possible, have someone repaint the walls in these rooms with mold-resistant paint. Keep out of the rooms that are being cleaned and painted.  Wash your hands often with soap and water. If soap and water are not available, use hand sanitizer.  Do not allow anyone to smoke in your home. General instructions  Take over-the-counter and prescription medicines only as told by your doctor. ? Talk with your doctor if you have questions about how or when to take your medicines. ? Make note if you need to use your medicines more often than usual.  Do not use any products that contain nicotine or tobacco, such as cigarettes and e-cigarettes. If you need help quitting, ask your doctor.  Stay away from secondhand smoke.  Avoid doing things outdoors when allergen counts  are high and when air quality is low.  Wear a ski mask when doing outdoor activities in the winter. The mask should cover your nose and mouth. Exercise indoors on cold days if you can.  Warm up before you exercise. Take time to cool down after exercise.  Use a peak flow meter as told by your doctor. A peak flow meter is a tool that measures how well the lungs are  working.  Keep track of the peak flow meter's readings. Write them down.  Follow your asthma action plan. This is a written plan for taking care of your asthma and treating your attacks.  Make sure you get all the shots (vaccines) that your doctor recommends. Ask your doctor about a flu shot and a pneumonia shot.  Keep all follow-up visits as told by your doctor. This is important. Contact a doctor if:  You have wheezing, shortness of breath, or a cough even while taking medicine to prevent attacks.  The mucus you cough up (sputum) is thicker than usual.  The mucus you cough up changes from clear or white to yellow, green, gray, or bloody.  You have problems from the medicine you are taking, such as: ? A rash. ? Itching. ? Swelling. ? Trouble breathing.  You need reliever medicines more than 2-3 times a week.  Your peak flow reading is still at 50-79% of your personal best after following the action plan for 1 hour.  You have a fever. Get help right away if:  You seem to be worse and are not responding to medicine during an asthma attack.  You are short of breath even at rest.  You get short of breath when doing very little activity.  You have trouble eating, drinking, or talking.  You have chest pain or tightness.  You have a fast heartbeat.  Your lips or fingernails start to turn blue.  You are light-headed or dizzy, or you faint.  Your peak flow is less than 50% of your personal best.  You feel too tired to breathe normally. Summary  Asthma is a long-term (chronic) condition in which the airways get tight and narrow. An asthma attack can make it hard to breathe.  Asthma cannot be cured, but medicines and lifestyle changes can help control it.  Make sure you understand how to avoid triggers and how and when to use your medicines. This information is not intended to replace advice given to you by your health care provider. Make sure you discuss any questions  you have with your health care provider. Document Released: 04/20/2008 Document Revised: 12/07/2016 Document Reviewed: 12/07/2016 Elsevier Interactive Patient Education  2019 [employer].

## 2019-01-01 ENCOUNTER — Other Ambulatory Visit: Payer: Self-pay

## 2019-01-01 ENCOUNTER — Emergency Department
Admission: EM | Admit: 2019-01-01 | Discharge: 2019-01-01 | Disposition: A | Discharge status: Home / Self Care | Payer: Medicaid Other | Attending: Emergency Medicine | Admitting: Emergency Medicine

## 2019-01-01 ENCOUNTER — Emergency Department: Payer: Medicaid Other

## 2019-01-01 DIAGNOSIS — J4521 Mild intermittent asthma with (acute) exacerbation: Secondary | ICD-10-CM

## 2019-01-01 DIAGNOSIS — J45901 Unspecified asthma with (acute) exacerbation: Secondary | ICD-10-CM | POA: Insufficient documentation

## 2019-01-01 DIAGNOSIS — R0602 Shortness of breath: Secondary | ICD-10-CM | POA: Diagnosis present

## 2019-01-01 DIAGNOSIS — Z79899 Other long term (current) drug therapy: Secondary | ICD-10-CM | POA: Insufficient documentation

## 2019-01-01 MED ORDER — ALBUTEROL SULFATE (2.5 MG/3ML) 0.083% IN NEBU
20.0000 mg/h | INHALATION_SOLUTION | Freq: Once | RESPIRATORY_TRACT | Status: AC
  Administered 2019-01-01: 20 mg/h via RESPIRATORY_TRACT
  Filled 2019-01-01: qty 24

## 2019-01-01 MED ORDER — ALBUTEROL SULFATE (2.5 MG/3ML) 0.083% IN NEBU
5.0000 mg | INHALATION_SOLUTION | Freq: Once | RESPIRATORY_TRACT | Status: AC
  Administered 2019-01-01: 5 mg via RESPIRATORY_TRACT
  Filled 2019-01-01: qty 6

## 2019-01-01 MED ORDER — PREDNISONE 20 MG PO TABS: 40.0000 mg | ORAL_TABLET | Freq: Every day | ORAL | 0 refills | Status: DC

## 2019-01-01 MED ORDER — METHYLPREDNISOLONE SODIUM SUCC 125 MG IJ SOLR
125.0000 mg | Freq: Once | INTRAMUSCULAR | Status: AC
  Administered 2019-01-01: 125 mg via INTRAVENOUS
  Filled 2019-01-01: qty 2

## 2019-01-01 MED ORDER — MAGNESIUM SULFATE 2 GM/50ML IV SOLN
2.0000 g | Freq: Once | INTRAVENOUS | Status: AC
  Administered 2019-01-01: 2 g via INTRAVENOUS
  Filled 2019-01-01: qty 50

## 2019-01-01 MED ORDER — IPRATROPIUM-ALBUTEROL 0.5-2.5 (3) MG/3ML IN SOLN
3.0000 mL | Freq: Once | RESPIRATORY_TRACT | Status: AC
  Administered 2019-01-01: 3 mL via RESPIRATORY_TRACT
  Filled 2019-01-01: qty 3

## 2019-01-01 NOTE — ED Triage Notes (Signed)
Pt presents with cough and congestion x 3 days, which has progressed and is causing his asthma to flare up. He reports that he has used his inhaler and nebulizer today with no relief. Pt alert & oriented, nad noted.

## 2019-01-01 NOTE — ED Provider Notes (Signed)
[facility] Emergency Department Provider Note   ____________________________________________   I have reviewed the triage vital signs and the nursing notes.   HISTORY  Chief Complaint Shortness of Breath   History limited by: Not Limited   HPI John Schwartz is a 23 y.o. male who presents to the emergency department today as of concerns for asthma exacerbation.  Patient states he has been more short of breath over the past week.  Does have a history of asthma and is on both controlling medications as well as a rescue inhaler.  He has been trying his nebulizer treatments and inhalers without any real significant relief.  Couple of days ago he started getting some chest tightness.  Is located across the front of his chest.  He does get this tightness when he has his asthma exacerbations.  He denies any fevers.  States he has not recently followed up with his primary care physician.    Per medical record review patient has a history of frequent ER visits for asthma.   Past Medical History:  Diagnosis Date  . Asthma   . Bronchospasm     Patient Active Problem List   Diagnosis Date Noted  . Asthma with status asthmaticus 06/04/2016  . Acute hypokalemia 06/04/2016  . Noncompliance with medication treatment due to underuse of medication 06/04/2016  . Acute renal insufficiency 06/04/2016  . Acute asthma exacerbation 06/04/2016  . Dyspnea 03/16/2016  . Asthma exacerbation 03/16/2016  . Status asthmaticus 02/11/2014  . Acute respiratory failure (HCC) 02/11/2014    Past Surgical History:  Procedure Laterality Date  . NO PAST SURGERIES      Prior to Admission medications   Medication Sig Start Date End Date Taking? Authorizing Provider  albuterol (PROVENTIL HFA;VENTOLIN HFA) 108 (90 Base) MCG/ACT inhaler Inhale 2 puffs into the lungs every 6 (six) hours as needed for wheezing or shortness of breath. 11/18/18   Gouru, Deanna Artis, MD  albuterol (PROVENTIL) (2.5  MG/3ML) 0.083% nebulizer solution Take 3 mLs (2.5 mg total) by nebulization every 4 (four) hours as needed for wheezing or shortness of breath. 11/18/18   Gouru, Deanna Artis, MD  benzonatate (TESSALON PERLES) 100 MG capsule Take 1 capsule (100 mg total) by mouth 3 (three) times daily as needed for cough. 11/18/18   Ramonita Lab, MD  doxycycline (VIBRA-TABS) 100 MG tablet Take 1 tablet (100 mg total) by mouth every 12 (twelve) hours. 11/18/18   Gouru, Deanna Artis, MD  Fluticasone-Salmeterol (ADVAIR DISKUS) 100-50 MCG/DOSE AEPB Inhale 1 puff into the lungs 2 (two) times daily. 11/18/18 11/18/19  Gouru, Deanna Artis, MD  ipratropium-albuterol (DUONEB) 0.5-2.5 (3) MG/3ML SOLN Take 3 mLs by nebulization every 4 (four) hours as needed. 11/18/18   Gouru, Deanna Artis, MD  predniSONE (STERAPRED UNI-PAK 21 TAB) 10 MG (21) TBPK tablet Take 1 tablet (10 mg total) by mouth daily. Take 6 tablets by mouth for 1 day followed by  5 tablets by mouth for 1 day followed by  4 tablets by mouth for 1 day followed by  3 tablets by mouth for 1 day followed by  2 tablets by mouth for 1 day followed by  1 tablet by mouth for a day and stop 11/18/18   Ramonita Lab, MD    Allergies Patient has no known allergies.  Family History  Problem Relation Age of Onset  . Arthritis Paternal Grandmother     Social History Social History   Tobacco Use  . Smoking status: Never Smoker  . Smokeless tobacco: Never  Used  Substance Use Topics  . Alcohol use: No  . Drug use: Not Currently    Types: Marijuana    Comment: Hx of smoking marajauna, last used over a year ago    Review of Systems Constitutional: No fever/chills Eyes: No visual changes. ENT: No sore throat. Cardiovascular: Positive for chest tightness.  Respiratory: Positive for shortness of breath. Gastrointestinal: No abdominal pain.  No nausea, no vomiting.  No diarrhea.   Genitourinary: Negative for dysuria. Musculoskeletal: Negative for back pain. Skin: Negative for rash. Neurological:  Negative for headaches, focal weakness or numbness.  ____________________________________________   PHYSICAL EXAM:  VITAL SIGNS: ED Triage Vitals  Enc Vitals Group     BP 01/01/19 1240 132/88     Pulse Rate 01/01/19 1240 84     Resp 01/01/19 1240 20     Temp 01/01/19 1240 98 F (36.7 C)     Temp Source 01/01/19 1240 Oral     SpO2 01/01/19 1239 95 %     Weight 01/01/19 1241 165 lb (74.8 kg)     Height 01/01/19 1241 5\' 10"  (1.778 m)     Head Circumference --      Peak Flow --      Pain Score 01/01/19 1240 7   Constitutional: Alert and oriented.  Eyes: Conjunctivae are normal.  ENT      Head: Normocephalic and atraumatic.      Nose: No congestion/rhinnorhea.      Mouth/Throat: Mucous membranes are moist.      Neck: No stridor. Hematological/Lymphatic/Immunilogical: No cervical lymphadenopathy. Cardiovascular: Normal rate, regular rhythm.  No murmurs, rubs, or gallops.  Respiratory: Normal respiratory effort. Diffuse expiratory wheezing Gastrointestinal: Soft and non tender. No rebound. No guarding.  Genitourinary: Deferred Musculoskeletal: Normal range of motion in all extremities. No lower extremity edema. Neurologic:  Normal speech and language. No gross focal neurologic deficits are appreciated.  Skin:  Skin is warm, dry and intact. No rash noted. Psychiatric: Mood and affect are normal. Speech and behavior are normal. Patient exhibits appropriate insight and judgment.  ____________________________________________    LABS (pertinent positives/negatives)  None  ____________________________________________   EKG  I, Phineas Semen, attending physician, personally viewed and interpreted this EKG  EKG Time: 1247 Rate: 80 Rhythm: normal sinus rhythm  Axis: rightward axis Intervals: qtc 408 QRS: narrow ST changes: no st elevation Impression: abnormal ekg  ____________________________________________    RADIOLOGY  CXR No acute  findings  ____________________________________________   PROCEDURES  Procedures  ____________________________________________   INITIAL IMPRESSION / ASSESSMENT AND PLAN / ED COURSE  Pertinent labs & imaging results that were available during my care of the patient were reviewed by me and considered in my medical decision making (see chart for details).   Patient presented to the emergency department today because of concerns for shortness of breath and asthma exacerbation.  On initial exam patient did have diffuse expiratory wheezing.  Chest x-ray did not reveal any pneumonia.  Patient was given Solu-Medrol, magnesium and breathing treatments here in the emergency department.  He was observed for a number of hours with improvement of his breathing.  At the time of discharge she felt comfortable going home to manage the asthma.  Will give patient prescription for prednisone.  ____________________________________________   FINAL CLINICAL IMPRESSION(S) / ED DIAGNOSES  Final diagnoses:  Mild intermittent asthma with exacerbation     Note: This dictation was prepared with Dragon dictation. Any transcriptional errors that result from this process are unintentional  Phineas SemenGoodman, Estephanie Hubbs, MD 01/01/19 2113

## 2019-01-01 NOTE — ED Notes (Signed)
Pt sitting up in bed, still getting breathing treatment; no complaints at this time;

## 2019-01-01 NOTE — Discharge Instructions (Addendum)
Please seek medical attention for any high fevers, chest pain, shortness of breath, change in behavior, persistent vomiting, bloody stool or any other new or concerning symptoms.  

## 2019-02-01 ENCOUNTER — Emergency Department
Admission: EM | Admit: 2019-02-01 | Discharge: 2019-02-01 | Disposition: A | Discharge status: Home / Self Care | Payer: Medicaid Other | Attending: Emergency Medicine | Admitting: Emergency Medicine

## 2019-02-01 ENCOUNTER — Other Ambulatory Visit: Payer: Self-pay

## 2019-02-01 DIAGNOSIS — Z79899 Other long term (current) drug therapy: Secondary | ICD-10-CM | POA: Diagnosis not present

## 2019-02-01 DIAGNOSIS — J4521 Mild intermittent asthma with (acute) exacerbation: Secondary | ICD-10-CM | POA: Diagnosis not present

## 2019-02-01 DIAGNOSIS — R0600 Dyspnea, unspecified: Secondary | ICD-10-CM | POA: Diagnosis present

## 2019-02-01 MED ORDER — COMPRESSOR/NEBULIZER MISC: 1.0000 | Freq: Once | 0 refills | Status: AC

## 2019-02-01 MED ORDER — IPRATROPIUM-ALBUTEROL 0.5-2.5 (3) MG/3ML IN SOLN
3.0000 mL | Freq: Once | RESPIRATORY_TRACT | Status: AC
  Administered 2019-02-01: 3 mL via RESPIRATORY_TRACT

## 2019-02-01 MED ORDER — IPRATROPIUM-ALBUTEROL 0.5-2.5 (3) MG/3ML IN SOLN
RESPIRATORY_TRACT | Status: AC
  Administered 2019-02-01: 3 mL via RESPIRATORY_TRACT
  Filled 2019-02-01: qty 6

## 2019-02-01 MED ORDER — METHYLPREDNISOLONE SODIUM SUCC 125 MG IJ SOLR
125.0000 mg | Freq: Once | INTRAMUSCULAR | Status: AC
  Administered 2019-02-01: 125 mg via INTRAVENOUS

## 2019-02-01 MED ORDER — ALBUTEROL SULFATE (2.5 MG/3ML) 0.083% IN NEBU: 2.5000 mg | INHALATION_SOLUTION | Freq: Four times a day (QID) | RESPIRATORY_TRACT | 2 refills | Status: DC | PRN

## 2019-02-01 MED ORDER — MAGNESIUM SULFATE 2 GM/50ML IV SOLN
2.0000 g | Freq: Once | INTRAVENOUS | Status: AC
  Administered 2019-02-01: 2 g via INTRAVENOUS

## 2019-02-01 MED ORDER — METHYLPREDNISOLONE SODIUM SUCC 125 MG IJ SOLR
INTRAMUSCULAR | Status: AC
  Administered 2019-02-01: 125 mg via INTRAVENOUS
  Filled 2019-02-01: qty 2

## 2019-02-01 MED ORDER — ALBUTEROL SULFATE HFA 108 (90 BASE) MCG/ACT IN AERS: 2.0000 | INHALATION_SPRAY | Freq: Four times a day (QID) | RESPIRATORY_TRACT | 0 refills | Status: DC | PRN

## 2019-02-01 MED ORDER — ALBUTEROL SULFATE (2.5 MG/3ML) 0.083% IN NEBU
2.5000 mg | INHALATION_SOLUTION | Freq: Once | RESPIRATORY_TRACT | Status: AC
  Administered 2019-02-01: 2.5 mg via RESPIRATORY_TRACT
  Filled 2019-02-01: qty 3

## 2019-02-01 MED ORDER — PREDNISONE 20 MG PO TABS: 60.0000 mg | ORAL_TABLET | Freq: Every day | ORAL | 0 refills | Status: AC

## 2019-02-01 MED ORDER — MAGNESIUM SULFATE 2 GM/50ML IV SOLN
INTRAVENOUS | Status: AC
  Administered 2019-02-01: 2 g via INTRAVENOUS
  Filled 2019-02-01: qty 50

## 2019-02-01 NOTE — ED Provider Notes (Signed)
[facility] Emergency Department Provider Note    First MD Initiated Contact with Patient 02/01/19 [phone]     (approximate)  I have reviewed the triage vital signs and the nursing notes.   HISTORY  Chief Complaint Asthma    HPI John Schwartz is a 22 y.o. male with history of asthma presents to the emergency department with a 1 day history of progressive wheezing and dyspnea unrelieved with home albuterol inhaler treatment.  Patient denies any fever.  Patient states that he has approximately an asthma attack monthly chart review reveals that this is consistent ED visit as a last ED visit for an asthma exacerbation was on 01/01/2019        Past Medical History:  Diagnosis Date  . Asthma   . Bronchospasm     Patient Active Problem List   Diagnosis Date Noted  . Asthma with status asthmaticus 06/04/2016  . Acute hypokalemia 06/04/2016  . Noncompliance with medication treatment due to underuse of medication 06/04/2016  . Acute renal insufficiency 06/04/2016  . Acute asthma exacerbation 06/04/2016  . Dyspnea 03/16/2016  . Asthma exacerbation 03/16/2016  . Status asthmaticus 02/11/2014  . Acute respiratory failure (HCC) 02/11/2014    Past Surgical History:  Procedure Laterality Date  . NO PAST SURGERIES      Prior to Admission medications   Medication Sig Start Date End Date Taking? Authorizing Provider  albuterol (PROVENTIL HFA;VENTOLIN HFA) 108 (90 Base) MCG/ACT inhaler Inhale 2 puffs into the lungs every 6 (six) hours as needed for wheezing or shortness of breath. 11/18/18   Gouru, Deanna Artis, MD  albuterol (PROVENTIL) (2.5 MG/3ML) 0.083% nebulizer solution Take 3 mLs (2.5 mg total) by nebulization every 4 (four) hours as needed for wheezing or shortness of breath. 11/18/18   Gouru, Deanna Artis, MD  benzonatate (TESSALON PERLES) 100 MG capsule Take 1 capsule (100 mg total) by mouth 3 (three) times daily as needed for cough. Patient not taking: Reported on  01/01/2019 11/18/18   Ramonita Lab, MD  doxycycline (VIBRA-TABS) 100 MG tablet Take 1 tablet (100 mg total) by mouth every 12 (twelve) hours. Patient not taking: Reported on 01/01/2019 11/18/18   Ramonita Lab, MD  Fluticasone-Salmeterol (ADVAIR DISKUS) 100-50 MCG/DOSE AEPB Inhale 1 puff into the lungs 2 (two) times daily. 11/18/18 11/18/19  Gouru, Deanna Artis, MD  ipratropium-albuterol (DUONEB) 0.5-2.5 (3) MG/3ML SOLN Take 3 mLs by nebulization every 4 (four) hours as needed. 11/18/18   Gouru, Deanna Artis, MD  predniSONE (DELTASONE) 20 MG tablet Take 2 tablets (40 mg total) by mouth daily. 01/01/19   Phineas Semen, MD  predniSONE (STERAPRED UNI-PAK 21 TAB) 10 MG (21) TBPK tablet Take 1 tablet (10 mg total) by mouth daily. Take 6 tablets by mouth for 1 day followed by  5 tablets by mouth for 1 day followed by  4 tablets by mouth for 1 day followed by  3 tablets by mouth for 1 day followed by  2 tablets by mouth for 1 day followed by  1 tablet by mouth for a day and stop Patient not taking: Reported on 01/01/2019 11/18/18   Ramonita Lab, MD    Allergies Patient has no known allergies.  Family History  Problem Relation Age of Onset  . Arthritis Paternal Grandmother     Social History Social History   Tobacco Use  . Smoking status: Never Smoker  . Smokeless tobacco: Never Used  Substance Use Topics  . Alcohol use: No  . Drug use: Not Currently  Types: Marijuana    Comment: Hx of smoking marajauna, last used over a year ago    Review of Systems Constitutional: No fever/chills Eyes: No visual changes. ENT: No sore throat. Cardiovascular: Denies chest pain. Respiratory: Positive for wheezing and dyspnea Gastrointestinal: No abdominal pain.  No nausea, no vomiting.  No diarrhea.  No constipation. Genitourinary: Negative for dysuria. Musculoskeletal: Negative for neck pain.  Negative for back pain. Integumentary: Negative for rash. Neurological: Negative for headaches, focal weakness or numbness.    ____________________________________________   PHYSICAL EXAM:  VITAL SIGNS: ED Triage Vitals  Enc Vitals Group     BP 02/01/19 0158 116/70     Pulse Rate 02/01/19 0158 96     Resp 02/01/19 0158 20     Temp 02/01/19 0158 98.2 F (36.8 C)     Temp src --      SpO2 02/01/19 0158 97 %     Weight 02/01/19 0157 74.8 kg (165 lb)     Height 02/01/19 0157 1.778 m (5\' 10" )     Head Circumference --      Peak Flow --      Pain Score 02/01/19 0157 8     Pain Loc --      Pain Edu? --      Excl. in GC? --     Constitutional: Alert and oriented. Well appearing and in no acute distress. Eyes: Conjunctivae are normal. Mouth/Throat: Mucous membranes are moist.  Oropharynx non-erythematous. Neck: No stridor.   Cardiovascular: Normal rate, regular rhythm. Good peripheral circulation. Grossly normal heart sounds. Respiratory: Tachypnea, coarse expiratory wheezing diffusely. Gastrointestinal: Soft and nontender. No distention.  Musculoskeletal: No lower extremity tenderness nor edema. No gross deformities of extremities. Neurologic:  Normal speech and language. No gross focal neurologic deficits are appreciated.  Skin:  Skin is warm, dry and intact. No rash noted.    Procedures   ____________________________________________   INITIAL IMPRESSION / MDM / ASSESSMENT AND PLAN / ED COURSE  As part of my medical decision making, I reviewed the following data within the electronic MEDICAL RECORD NUMBER   23 year old male presenting with above-stated history and physical exam consistent with acute asthma exacerbation.  Patient given 2 DuoNeb's initially after my evaluation with improvement of wheezing.  In addition patient given Solu-Medrol 125 mg IV.  Patient required an additional albuterol nebulized treatment.  On reevaluation wheezing resolved patient not tachypneic speaking in full sentences.  I spoke with the patient at length regarding the necessity of following up with primary care provider for  adequate asthma management.  Patient prescribed prednisone and albuterol for home     ____________________________________________  FINAL CLINICAL IMPRESSION(S) / ED DIAGNOSES  Final diagnoses:  Mild intermittent asthma with exacerbation     MEDICATIONS GIVEN DURING THIS VISIT:  Medications  methylPREDNISolone sodium succinate (SOLU-MEDROL) 125 mg/2 mL injection 125 mg (125 mg Intravenous Given 02/01/19 0212)  ipratropium-albuterol (DUONEB) 0.5-2.5 (3) MG/3ML nebulizer solution 3 mL (3 mLs Nebulization Given 02/01/19 0216)  ipratropium-albuterol (DUONEB) 0.5-2.5 (3) MG/3ML nebulizer solution 3 mL (3 mLs Nebulization Given 02/01/19 0216)  albuterol (PROVENTIL) (2.5 MG/3ML) 0.083% nebulizer solution 2.5 mg (2.5 mg Nebulization Given 02/01/19 0216)  magnesium sulfate IVPB 2 g 50 mL (2 g Intravenous New Bag/Given 02/01/19 0215)     ED Discharge Orders    None       Note:  This document was prepared using Dragon voice recognition software and may include unintentional dictation errors.   Darci Current, MD  02/01/19 0622  

## 2019-02-01 NOTE — ED Triage Notes (Signed)
Pt with hx of asthma shob since this am, tried albuterol without relief.

## 2019-02-27 ENCOUNTER — Encounter: Payer: Medicaid Other | Admitting: Family Medicine

## 2019-02-27 NOTE — Progress Notes (Signed)
    Patient unable at time of Video visit and unable to be reached by telephone. No show new patient.  Erasmo Downer, MD, MPH [facility] 02/27/2019 1:32 PM

## 2019-03-07 ENCOUNTER — Emergency Department: Payer: Medicaid Other

## 2019-03-07 ENCOUNTER — Other Ambulatory Visit: Payer: Self-pay

## 2019-03-07 ENCOUNTER — Encounter: Payer: Self-pay | Admitting: Emergency Medicine

## 2019-03-07 ENCOUNTER — Emergency Department
Admission: EM | Admit: 2019-03-07 | Discharge: 2019-03-07 | Disposition: A | Discharge status: Home / Self Care | Payer: Medicaid Other | Attending: Emergency Medicine | Admitting: Emergency Medicine

## 2019-03-07 DIAGNOSIS — R0600 Dyspnea, unspecified: Secondary | ICD-10-CM | POA: Diagnosis not present

## 2019-03-07 DIAGNOSIS — J45998 Other asthma: Secondary | ICD-10-CM | POA: Diagnosis not present

## 2019-03-07 DIAGNOSIS — J4521 Mild intermittent asthma with (acute) exacerbation: Secondary | ICD-10-CM

## 2019-03-07 DIAGNOSIS — R0602 Shortness of breath: Secondary | ICD-10-CM | POA: Diagnosis present

## 2019-03-07 LAB — CBC
HCT: 46.4 % (ref 39.0–52.0)
Hemoglobin: 15.1 g/dL (ref 13.0–17.0)
MCH: 27.1 pg (ref 26.0–34.0)
MCHC: 32.5 g/dL (ref 30.0–36.0)
MCV: 83.2 fL (ref 80.0–100.0)
Platelets: 306 10*3/uL (ref 150–400)
RBC: 5.58 MIL/uL (ref 4.22–5.81)
RDW: 12.4 % (ref 11.5–15.5)
WBC: 8.7 10*3/uL (ref 4.0–10.5)
nRBC: 0 % (ref 0.0–0.2)

## 2019-03-07 LAB — COMPREHENSIVE METABOLIC PANEL
ALT: 18 U/L (ref 0–44)
AST: 21 U/L (ref 15–41)
Albumin: 4.6 g/dL (ref 3.5–5.0)
Alkaline Phosphatase: 65 U/L (ref 38–126)
Anion gap: 9 (ref 5–15)
BUN: 9 mg/dL (ref 6–20)
CO2: 26 mmol/L (ref 22–32)
Calcium: 9.5 mg/dL (ref 8.9–10.3)
Chloride: 106 mmol/L (ref 98–111)
Creatinine, Ser: 1.3 mg/dL — ABNORMAL HIGH (ref 0.61–1.24)
GFR calc Af Amer: >60 mL/min (ref >60)
GFR calc non Af Amer: >60 mL/min (ref >60)
Glucose, Bld: 128 mg/dL — ABNORMAL HIGH (ref 70–99)
Potassium: 3.8 mmol/L (ref 3.5–5.1)
Sodium: 141 mmol/L (ref 135–145)
Total Bilirubin: 0.6 mg/dL (ref 0.3–1.2)
Total Protein: 7.7 g/dL (ref 6.5–8.1)

## 2019-03-07 MED ORDER — IPRATROPIUM-ALBUTEROL 0.5-2.5 (3) MG/3ML IN SOLN
3.0000 mL | Freq: Once | RESPIRATORY_TRACT | Status: AC
  Administered 2019-03-07: 3 mL via RESPIRATORY_TRACT
  Filled 2019-03-07: qty 3

## 2019-03-07 MED ORDER — IPRATROPIUM-ALBUTEROL 0.5-2.5 (3) MG/3ML IN SOLN
3.0000 mL | Freq: Once | RESPIRATORY_TRACT | Status: AC
  Administered 2019-03-07: 18:00:00 3 mL via RESPIRATORY_TRACT
  Filled 2019-03-07: qty 3

## 2019-03-07 MED ORDER — PREDNISONE 20 MG PO TABS: 40.0000 mg | ORAL_TABLET | Freq: Every day | ORAL | 0 refills | Status: DC

## 2019-03-07 MED ORDER — MAGNESIUM SULFATE 2 GM/50ML IV SOLN
2.0000 g | Freq: Once | INTRAVENOUS | Status: AC
  Administered 2019-03-07: 19:00:00 2 g via INTRAVENOUS
  Filled 2019-03-07: qty 50

## 2019-03-07 MED ORDER — METHYLPREDNISOLONE SODIUM SUCC 125 MG IJ SOLR
125.0000 mg | Freq: Once | INTRAMUSCULAR | Status: AC
  Administered 2019-03-07: 125 mg via INTRAVENOUS
  Filled 2019-03-07: qty 2

## 2019-03-07 NOTE — ED Triage Notes (Addendum)
Pt presents to ED via POV doing portable neb treatment in lobby, pt immediately directed to turn neb tx off. Pt states hx of asthma, has done 7 neb tx at home, is able to answer in 1 or 2 words. Pt with noted dyspnea upon arrival. Pt states unable to sit back, states more comfortable standing or sitting up straight. EDP at bedside to assess.

## 2019-03-07 NOTE — ED Provider Notes (Signed)
[facility] Emergency Department Provider Note  Time seen: 5:21 PM  I have reviewed the triage vital signs and the nursing notes.   HISTORY  Chief Complaint Shortness of Breath    HPI John Schwartz is a 23 y.o. male with a past medical history of asthma presents to the emergency department for dyspnea.  According to the patient  since this morning he has been feeling very short of breath has used 7 nebulizer treatments at home, was using a nebulizer treatment upon arrival to the hospital continued to feel short of breath came to the emergency department.  Patient denies any fever, congestion, travel or sick contacts.  Patient has a history of severe asthma exacerbations.  Denies any chest pain or abdominal pain.  Past Medical History:  Diagnosis Date  . Asthma   . Bronchospasm     Patient Active Problem List   Diagnosis Date Noted  . Asthma with status asthmaticus 06/04/2016  . Acute hypokalemia 06/04/2016  . Noncompliance with medication treatment due to underuse of medication 06/04/2016  . Acute renal insufficiency 06/04/2016  . Acute asthma exacerbation 06/04/2016  . Dyspnea 03/16/2016  . Asthma exacerbation 03/16/2016  . Severe persistent asthma 03/12/2014  . Status asthmaticus 02/11/2014  . Acute respiratory failure (HCC) 02/11/2014    Past Surgical History:  Procedure Laterality Date  . NO PAST SURGERIES      Prior to Admission medications   Medication Sig Start Date End Date Taking? Authorizing Provider  albuterol (PROVENTIL HFA;VENTOLIN HFA) 108 (90 Base) MCG/ACT inhaler Inhale 2 puffs into the lungs every 6 (six) hours as needed for wheezing or shortness of breath. 11/18/18   Gouru, Deanna ArtisAruna, MD  albuterol (PROVENTIL HFA;VENTOLIN HFA) 108 (90 Base) MCG/ACT inhaler Inhale 2 puffs into the lungs every 6 (six) hours as needed for wheezing or shortness of breath. 02/01/19   Darci CurrentBrown, [place] N, MD  albuterol (PROVENTIL) (2.5 MG/3ML) 0.083% nebulizer  solution Take 3 mLs (2.5 mg total) by nebulization every 4 (four) hours as needed for wheezing or shortness of breath. 11/18/18   Gouru, Deanna ArtisAruna, MD  albuterol (PROVENTIL) (2.5 MG/3ML) 0.083% nebulizer solution Take 3 mLs (2.5 mg total) by nebulization every 6 (six) hours as needed for wheezing or shortness of breath. 02/01/19   Darci CurrentBrown, [place] N, MD  benzonatate (TESSALON PERLES) 100 MG capsule Take 1 capsule (100 mg total) by mouth 3 (three) times daily as needed for cough. Patient not taking: Reported on 01/01/2019 11/18/18   Ramonita LabGouru, Aruna, MD  doxycycline (VIBRA-TABS) 100 MG tablet Take 1 tablet (100 mg total) by mouth every 12 (twelve) hours. Patient not taking: Reported on 01/01/2019 11/18/18   Ramonita LabGouru, Aruna, MD  Fluticasone-Salmeterol (ADVAIR DISKUS) 100-50 MCG/DOSE AEPB Inhale 1 puff into the lungs 2 (two) times daily. 11/18/18 11/18/19  Gouru, Deanna ArtisAruna, MD  ipratropium-albuterol (DUONEB) 0.5-2.5 (3) MG/3ML SOLN Take 3 mLs by nebulization every 4 (four) hours as needed. 11/18/18   Gouru, Deanna ArtisAruna, MD  predniSONE (DELTASONE) 20 MG tablet Take 2 tablets (40 mg total) by mouth daily. 01/01/19   Phineas SemenGoodman, [place], MD  predniSONE (STERAPRED UNI-PAK 21 TAB) 10 MG (21) TBPK tablet Take 1 tablet (10 mg total) by mouth daily. Take 6 tablets by mouth for 1 day followed by  5 tablets by mouth for 1 day followed by  4 tablets by mouth for 1 day followed by  3 tablets by mouth for 1 day followed by  2 tablets by mouth for 1 day followed by  1 tablet by mouth for a day and stop Patient not taking: Reported on 01/01/2019 11/18/18   Ramonita Lab, MD    No Known Allergies  Family History  Problem Relation Age of Onset  . Arthritis Paternal Grandmother     Social History Social History   Tobacco Use  . Smoking status: Never Smoker  . Smokeless tobacco: Never Used  Substance Use Topics  . Alcohol use: No  . Drug use: Not Currently    Types: Marijuana    Comment: Hx of smoking marajauna, last used over a year ago     Review of Systems Constitutional: Negative for fever. ENT: Negative for recent illness/congestion Cardiovascular: Negative for chest pain. Respiratory: Positive for shortness of breath. Gastrointestinal: Negative for abdominal pain, vomiting Musculoskeletal: Negative for musculoskeletal complaints Skin: Negative for skin complaints  Neurological: Negative for headache All other ROS negative  ____________________________________________   PHYSICAL EXAM:  VITAL SIGNS: ED Triage Vitals  Enc Vitals Group     BP 03/07/19 1715 124/87     Pulse Rate 03/07/19 1715 (!) 131     Resp 03/07/19 1715 (!) 26     Temp 03/07/19 1715 98.7 F (37.1 C)     Temp Source 03/07/19 1715 Oral     SpO2 03/07/19 1715 97 %     Weight 03/07/19 1713 165 lb (74.8 kg)     Height 03/07/19 1713  (1.753 m)     Head Circumference --      Peak Flow --      Pain Score 03/07/19 1713 8     Pain Loc --      Pain Edu? --      Excl. in GC? --     Constitutional: Alert and oriented. Well appearing and in no distress. Eyes: Normal exam ENT      Head: Normocephalic and atraumatic.      Mouth/Throat: Mucous membranes are moist. Cardiovascular: Regular rhythm rate around 120.  No obvious murmur. Respiratory: Moderate tachypnea with diffuse wheeze. Gastrointestinal: Soft and nontender. No distention.   Musculoskeletal: Nontender with normal range of motion in all extremities.  Neurologic:  Normal speech and language. No gross focal neurologic deficits  Skin:  Skin is warm, dry and intact.  Psychiatric: Mood and affect are normal.   ____________________________________________    EKG  EKG viewed and interpreted by myself shows sinus tachycardia 125 bpm with a narrow QRS, normal axis, normal intervals, nonspecific ST changes.  ____________________________________________    RADIOLOGY  This x-ray negative  ____________________________________________   INITIAL IMPRESSION / ASSESSMENT AND PLAN  / ED COURSE  Pertinent labs & imaging results that were available during my care of the patient were reviewed by me and considered in my medical decision making (see chart for details).   Patient presents to the emergency department for shortness of breath.  Differential would include asthma exacerbation, upper respiratory infection, pneumonia, pneumothorax.  Patient has a history of asthma exacerbation, has significant wheeze on exam very concerning for asthma exacerbation.  We will check labs, chest x-ray.  We will treat with Solu-Medrol.  Once the chest x-ray is resulted as long as the chest x-ray does not look suspicious for other findings we will start on nebulizer treatments.  Patient continued to have mild wheeze after initial breathing treatments.  Placed on magnesium.  After magnesium patient states he feels much better.  Currently satting 97 to 99% on room air.  Remains somewhat tachycardic around 110 to 115 bpm.  I discussed the options with the patient continues have a mild expiratory wheeze on exam, he would much prefer to be discharged home and admitted to the hospital.  We will discharge patient home however I discussed very strict return precautions if he is to feel any worse or feels short of breath he is to return to the emergency department for repeat evaluation and possible admission.  John Schwartz was evaluated in Emergency Department on 03/07/2019 for the symptoms described in the history of present illness. He was evaluated in the context of the global COVID-19 pandemic, which necessitated consideration that the patient might be at risk for infection with the SARS-CoV-2 virus that causes COVID-19. Institutional protocols and algorithms that pertain to the evaluation of patients at risk for COVID-19 are in a state of rapid change based on information released by regulatory bodies including the CDC and federal and state organizations. These policies and algorithms were followed during  the patient's care in the ED.  ____________________________________________   FINAL CLINICAL IMPRESSION(S) / ED DIAGNOSES  Asthma exacerbation   Minna Antis, MD 03/07/19 2022

## 2019-04-04 ENCOUNTER — Emergency Department
Admission: EM | Admit: 2019-04-04 | Discharge: 2019-04-04 | Disposition: A | Discharge status: Home / Self Care | Payer: Medicaid Other | Attending: Emergency Medicine | Admitting: Emergency Medicine

## 2019-04-04 ENCOUNTER — Emergency Department: Payer: Medicaid Other

## 2019-04-04 ENCOUNTER — Other Ambulatory Visit: Payer: Self-pay

## 2019-04-04 DIAGNOSIS — Z03818 Encounter for observation for suspected exposure to other biological agents ruled out: Secondary | ICD-10-CM | POA: Insufficient documentation

## 2019-04-04 DIAGNOSIS — R0602 Shortness of breath: Secondary | ICD-10-CM | POA: Diagnosis present

## 2019-04-04 DIAGNOSIS — J4541 Moderate persistent asthma with (acute) exacerbation: Secondary | ICD-10-CM | POA: Insufficient documentation

## 2019-04-04 LAB — CBC WITH DIFFERENTIAL/PLATELET
Abs Immature Granulocytes: 0.03 10*3/uL (ref 0.00–0.07)
Basophils Absolute: 0 10*3/uL (ref 0.0–0.1)
Basophils Relative: 0 %
Eosinophils Absolute: 1.2 10*3/uL — ABNORMAL HIGH (ref 0.0–0.5)
Eosinophils Relative: 17 %
HCT: 49 % (ref 39.0–52.0)
Hemoglobin: 15.8 g/dL (ref 13.0–17.0)
Immature Granulocytes: 0 %
Lymphocytes Relative: 19 %
Lymphs Abs: 1.4 10*3/uL (ref 0.7–4.0)
MCH: 27.2 pg (ref 26.0–34.0)
MCHC: 32.2 g/dL (ref 30.0–36.0)
MCV: 84.3 fL (ref 80.0–100.0)
Monocytes Absolute: 0.4 10*3/uL (ref 0.1–1.0)
Monocytes Relative: 6 %
Neutro Abs: 4.1 10*3/uL (ref 1.7–7.7)
Neutrophils Relative %: 58 %
Platelets: 289 10*3/uL (ref 150–400)
RBC: 5.81 MIL/uL (ref 4.22–5.81)
RDW: 12.2 % (ref 11.5–15.5)
WBC: 7.2 10*3/uL (ref 4.0–10.5)
nRBC: 0 % (ref 0.0–0.2)

## 2019-04-04 LAB — BASIC METABOLIC PANEL
Anion gap: 6 (ref 5–15)
BUN: 11 mg/dL (ref 6–20)
CO2: 26 mmol/L (ref 22–32)
Calcium: 9.2 mg/dL (ref 8.9–10.3)
Chloride: 107 mmol/L (ref 98–111)
Creatinine, Ser: 1.19 mg/dL (ref 0.61–1.24)
GFR calc Af Amer: >60 mL/min (ref >60)
GFR calc non Af Amer: >60 mL/min (ref >60)
Glucose, Bld: 132 mg/dL — ABNORMAL HIGH (ref 70–99)
Potassium: 3.7 mmol/L (ref 3.5–5.1)
Sodium: 139 mmol/L (ref 135–145)

## 2019-04-04 LAB — SARS CORONAVIRUS 2 BY RT PCR (HOSPITAL ORDER, PERFORMED IN ~~LOC~~ HOSPITAL LAB): SARS Coronavirus 2: NEGATIVE

## 2019-04-04 MED ORDER — MAGNESIUM SULFATE 2 GM/50ML IV SOLN
INTRAVENOUS | Status: AC
  Administered 2019-04-04: 2 g via INTRAVENOUS
  Filled 2019-04-04: qty 50

## 2019-04-04 MED ORDER — IPRATROPIUM-ALBUTEROL 0.5-2.5 (3) MG/3ML IN SOLN
3.0000 mL | Freq: Once | RESPIRATORY_TRACT | Status: AC
  Administered 2019-04-04: 3 mL via RESPIRATORY_TRACT
  Filled 2019-04-04: qty 3

## 2019-04-04 MED ORDER — IPRATROPIUM-ALBUTEROL 0.5-2.5 (3) MG/3ML IN SOLN: 3.0000 mL | RESPIRATORY_TRACT | 0 refills | Status: DC | PRN

## 2019-04-04 MED ORDER — ALBUTEROL SULFATE (2.5 MG/3ML) 0.083% IN NEBU
5.0000 mg | INHALATION_SOLUTION | Freq: Once | RESPIRATORY_TRACT | Status: AC
  Administered 2019-04-04: 5 mg via RESPIRATORY_TRACT
  Filled 2019-04-04: qty 6

## 2019-04-04 MED ORDER — MAGNESIUM SULFATE 2 GM/50ML IV SOLN
2.0000 g | INTRAVENOUS | Status: AC
  Administered 2019-04-04: 09:00:00 2 g via INTRAVENOUS
  Filled 2019-04-04: qty 50

## 2019-04-04 MED ORDER — PREDNISONE 20 MG PO TABS: 40.0000 mg | ORAL_TABLET | Freq: Every day | ORAL | 0 refills | Status: AC

## 2019-04-04 MED ORDER — METHYLPREDNISOLONE SODIUM SUCC 125 MG IJ SOLR
125.0000 mg | Freq: Once | INTRAMUSCULAR | Status: AC
  Administered 2019-04-04: 125 mg via INTRAVENOUS
  Filled 2019-04-04: qty 2

## 2019-04-04 MED ORDER — AZITHROMYCIN 250 MG PO TABS: ORAL_TABLET | ORAL | 0 refills | Status: DC

## 2019-04-04 MED ORDER — SODIUM CHLORIDE 0.9 % IV BOLUS
1000.0000 mL | Freq: Once | INTRAVENOUS | Status: AC
  Administered 2019-04-04: 09:00:00 1000 mL via INTRAVENOUS

## 2019-04-04 MED ORDER — METHYLPREDNISOLONE SODIUM SUCC 125 MG IJ SOLR
INTRAMUSCULAR | Status: AC
  Administered 2019-04-04: 125 mg via INTRAVENOUS
  Filled 2019-04-04: qty 2

## 2019-04-04 NOTE — ED Triage Notes (Signed)
Pt here by EMS for asthma exacerbation since last night. Per EMS he has had 10 nebulizer tx since last night and has used rescue inhaler x 5 since last night with no relief. Audible wheezes noted at bedside. Pt only able to speak in one word sentences at this time.

## 2019-04-04 NOTE — ED Provider Notes (Signed)
[facility] Emergency Department Provider Note  ____________________________________________  Time seen: Approximately 10:25 AM  I have reviewed the triage vital signs and the nursing notes.   HISTORY  Chief Complaint Shortness of Breath    HPI John O Wonda OldsCousin is a 23 y.o. male with a history of asthma who complains of shortness of breath since yesterday.  He has been using his nebulizer at home very frequently with minimal relief in symptoms.  Only able to speak a word or 2 at a time.  Denies chest pain.  Notes that he has a occasionally productive cough while using his nebulizer.  Denies fevers chills body aches or sick contacts.  Lives at home with his grandmother.  No trauma.  He notes multiple prior hospitalizations and intubations for severe asthma exacerbations.  No identifiable inciting event, no smoking or other irritant exposure.      Past Medical History:  Diagnosis Date  . Asthma   . Bronchospasm      Patient Active Problem List   Diagnosis Date Noted  . Asthma with status asthmaticus 06/04/2016  . Acute hypokalemia 06/04/2016  . Noncompliance with medication treatment due to underuse of medication 06/04/2016  . Acute renal insufficiency 06/04/2016  . Acute asthma exacerbation 06/04/2016  . Dyspnea 03/16/2016  . Asthma exacerbation 03/16/2016  . Severe persistent asthma 03/12/2014  . Status asthmaticus 02/11/2014  . Acute respiratory failure (HCC) 02/11/2014     Past Surgical History:  Procedure Laterality Date  . NO PAST SURGERIES       Prior to Admission medications   Medication Sig Start Date End Date Taking? Authorizing Provider  albuterol (PROVENTIL HFA;VENTOLIN HFA) 108 (90 Base) MCG/ACT inhaler Inhale 2 puffs into the lungs every 6 (six) hours as needed for wheezing or shortness of breath. 11/18/18   Gouru, Deanna ArtisAruna, MD  albuterol (PROVENTIL HFA;VENTOLIN HFA) 108 (90 Base) MCG/ACT inhaler Inhale 2 puffs into the lungs every  6 (six) hours as needed for wheezing or shortness of breath. 02/01/19   Darci CurrentBrown, [place] N, MD  albuterol (PROVENTIL) (2.5 MG/3ML) 0.083% nebulizer solution Take 3 mLs (2.5 mg total) by nebulization every 4 (four) hours as needed for wheezing or shortness of breath. 11/18/18   Gouru, Deanna ArtisAruna, MD  albuterol (PROVENTIL) (2.5 MG/3ML) 0.083% nebulizer solution Take 3 mLs (2.5 mg total) by nebulization every 6 (six) hours as needed for wheezing or shortness of breath. 02/01/19   Darci CurrentBrown, [place] N, MD  benzonatate (TESSALON PERLES) 100 MG capsule Take 1 capsule (100 mg total) by mouth 3 (three) times daily as needed for cough. Patient not taking: Reported on 01/01/2019 11/18/18   Ramonita LabGouru, Aruna, MD  doxycycline (VIBRA-TABS) 100 MG tablet Take 1 tablet (100 mg total) by mouth every 12 (twelve) hours. Patient not taking: Reported on 01/01/2019 11/18/18   Ramonita LabGouru, Aruna, MD  Fluticasone-Salmeterol (ADVAIR DISKUS) 100-50 MCG/DOSE AEPB Inhale 1 puff into the lungs 2 (two) times daily. 11/18/18 11/18/19  Gouru, Deanna ArtisAruna, MD  ipratropium-albuterol (DUONEB) 0.5-2.5 (3) MG/3ML SOLN Take 3 mLs by nebulization every 4 (four) hours as needed. 11/18/18   Gouru, Deanna ArtisAruna, MD  predniSONE (DELTASONE) 20 MG tablet Take 2 tablets (40 mg total) by mouth daily. 03/07/19   Minna AntisPaduchowski, Kevin, MD     Allergies Patient has no known allergies.   Family History  Problem Relation Age of Onset  . Arthritis Paternal Grandmother     Social History Social History   Tobacco Use  . Smoking status: Never Smoker  . Smokeless tobacco:  Never Used  Substance Use Topics  . Alcohol use: No  . Drug use: Not Currently    Types: Marijuana    Comment: Hx of smoking marajauna, last used over a year ago    Review of Systems  Constitutional:   No fever or chills.  ENT:   No sore throat. No rhinorrhea. Cardiovascular:   No chest pain or syncope. Respiratory: Positive shortness of breath with occasional cough. Gastrointestinal:   Negative for abdominal pain,  vomiting and diarrhea.  Musculoskeletal:   Negative for focal pain or swelling All other systems reviewed and are negative except as documented above in ROS and HPI.  ____________________________________________   PHYSICAL EXAM:  VITAL SIGNS: ED Triage Vitals  Enc Vitals Group     BP 04/04/19 0820 (!) 125/96     Pulse Rate 04/04/19 0820 (!) 121     Resp --      Temp 04/04/19 0820 98.8 F (37.1 C)     Temp Source 04/04/19 0820 Oral     SpO2 04/04/19 0819 98 %     Weight 04/04/19 0821 171 lb 15.3 oz (78 kg)     Height 04/04/19 0821  (1.753 m)     Head Circumference --      Peak Flow --      Pain Score 04/04/19 0820 0     Pain Loc --      Pain Edu? --      Excl. in [place]? --     Vital signs reviewed, nursing assessments reviewed.   Constitutional:   Alert and oriented.  Ill-appearing, mild respiratory distress. Eyes:   Conjunctivae are normal. EOMI. PERRL. ENT      Head:   Normocephalic and atraumatic.      Nose:   No congestion/rhinnorhea.       Mouth/Throat:   MMM, no pharyngeal erythema. No peritonsillar mass.       Neck:   No meningismus. Full ROM. Hematological/Lymphatic/Immunilogical:   No cervical lymphadenopathy. Cardiovascular:   Tachycardia heart rate 115. Symmetric bilateral radial and DP pulses.  No murmurs. Cap refill less than 2 seconds. Respiratory:   Increased work of breathing with accessory muscle use.  Diffuse expiratory wheezing and markedly prolonged expiratory phase.  Tachypnea with respiratory rate of about 24. Gastrointestinal:   Soft and nontender. Non distended. There is no CVA tenderness.  No rebound, rigidity, or guarding.  Musculoskeletal:   Normal range of motion in all extremities. No joint effusions.  No lower extremity tenderness.  No edema. Neurologic:   Normal speech and language.  Motor grossly intact. No acute focal neurologic deficits are appreciated.  Skin:    Skin is warm, dry and intact. No rash noted.  No petechiae, purpura, or  bullae.  ____________________________________________    LABS (pertinent positives/negatives) (all labs ordered are listed, but only abnormal results are displayed) Labs Reviewed  BASIC METABOLIC PANEL - Abnormal; Notable for the following components:      Result Value   Glucose, Bld 132 (*)    All other components within normal limits  CBC WITH DIFFERENTIAL/PLATELET - Abnormal; Notable for the following components:   Eosinophils Absolute 1.2 (*)    All other components within normal limits  SARS CORONAVIRUS 2 (HOSPITAL ORDER, PERFORMED IN [facility])   ____________________________________________   EKG    ____________________________________________    RADIOLOGY  Dg Chest Portable 1 View  Result Date: 04/04/2019 CLINICAL DATA:  Dyspnea. EXAM: PORTABLE CHEST 1 VIEW COMPARISON:  Radiograph of March 07, 2019. FINDINGS: The heart size and mediastinal contours are within normal limits. Both lungs are clear. No pneumothorax or pleural effusion is noted. The visualized skeletal structures are unremarkable. IMPRESSION: No active disease. Electronically Signed   By: Lupita Raider M.D.   On: 04/04/2019 09:09    ____________________________________________   PROCEDURES Procedures  ____________________________________________    CLINICAL IMPRESSION / ASSESSMENT AND PLAN / ED COURSE  Medications ordered in the ED: Medications  methylPREDNISolone sodium succinate (SOLU-MEDROL) 125 mg/2 mL injection 125 mg (125 mg Intravenous Given 04/04/19 0839)  magnesium sulfate IVPB 2 g 50 mL (0 g Intravenous Stopped 04/04/19 0903)  sodium chloride 0.9 % bolus 1,000 mL (1,000 mLs Intravenous New Bag/Given 04/04/19 0842)  ipratropium-albuterol (DUONEB) 0.5-2.5 (3) MG/3ML nebulizer solution 3 mL (3 mLs Nebulization Given 04/04/19 0949)  albuterol (PROVENTIL) (2.5 MG/3ML) 0.083% nebulizer solution 5 mg (5 mg Nebulization Given 04/04/19 0949)    Pertinent labs & imaging results  that were available during my care of the patient were reviewed by me and considered in my medical decision making (see chart for details).  Thorsten JAVAUGHN GERREN was evaluated in Emergency Department on 04/04/2019 for the symptoms described in the history of present illness. He was evaluated in the context of the global COVID-19 pandemic, which necessitated consideration that the patient might be at risk for infection with the SARS-CoV-2 virus that causes COVID-19. Institutional protocols and algorithms that pertain to the evaluation of patients at risk for COVID-19 are in a state of rapid change based on information released by regulatory bodies including the CDC and federal and state organizations. These policies and algorithms were followed during the patient's care in the ED.   Patient presents with shortness of breath wheezing consistent with bronchospasm and asthma exacerbation.  Due to the underlying severity of his asthma and this episode being refractory to his home nebulizer use and continued use of his home medications, patient was given 125 mg IV Solu-Medrol, 2 mg IV magnesium bolus.  I will check a COVID screening test, give around nebulizer treatments.  If he is not feeling much better, he will need to be hospitalized for continued bronchodilators and steroids to ensure improvement.   ----------------------------------------- 12:56 PM on 04/04/2019 -----------------------------------------  Patient now feeling much better.  He still has some mild end expiratory wheezing with FEV1 maneuver, but overall improved aeration.  He feels that this is manageable at home at this point and is agreeable with discharge and outpatient follow-up.  I have sent refills of his DuoNeb solution to his pharmacy.  I will also have him take a course of azithromycin and prednisone to ensure that his symptoms continue to improve.     ____________________________________________   FINAL CLINICAL IMPRESSION(S) / ED  DIAGNOSES    Final diagnoses:  Moderate persistent asthma with exacerbation     ED Discharge Orders    None      Portions of this note were generated with dragon dictation software. Dictation errors may occur despite best attempts at proofreading.   Sharman Cheek, MD 04/04/19 1257

## 2019-04-04 NOTE — ED Notes (Signed)
Audible wheezing present. No retractions noted.

## 2019-04-04 NOTE — ED Notes (Signed)
Pt feeling better after treatments.

## 2019-04-30 ENCOUNTER — Emergency Department: Payer: Medicaid Other

## 2019-04-30 ENCOUNTER — Other Ambulatory Visit: Payer: Self-pay

## 2019-04-30 ENCOUNTER — Emergency Department
Admission: EM | Admit: 2019-04-30 | Discharge: 2019-04-30 | Disposition: A | Discharge status: Home / Self Care | Payer: Medicaid Other | Attending: Emergency Medicine | Admitting: Emergency Medicine

## 2019-04-30 ENCOUNTER — Encounter: Payer: Self-pay | Admitting: Intensive Care

## 2019-04-30 DIAGNOSIS — Z79899 Other long term (current) drug therapy: Secondary | ICD-10-CM | POA: Insufficient documentation

## 2019-04-30 DIAGNOSIS — R0602 Shortness of breath: Secondary | ICD-10-CM | POA: Diagnosis present

## 2019-04-30 DIAGNOSIS — J4521 Mild intermittent asthma with (acute) exacerbation: Secondary | ICD-10-CM

## 2019-04-30 LAB — BASIC METABOLIC PANEL
Anion gap: 6 (ref 5–15)
BUN: 13 mg/dL (ref 6–20)
CO2: 25 mmol/L (ref 22–32)
Calcium: 9.1 mg/dL (ref 8.9–10.3)
Chloride: 106 mmol/L (ref 98–111)
Creatinine, Ser: 1.18 mg/dL (ref 0.61–1.24)
GFR calc Af Amer: >60 mL/min (ref >60)
GFR calc non Af Amer: >60 mL/min (ref >60)
Glucose, Bld: 89 mg/dL (ref 70–99)
Potassium: 3.7 mmol/L (ref 3.5–5.1)
Sodium: 137 mmol/L (ref 135–145)

## 2019-04-30 LAB — CBC
HCT: 43.5 % (ref 39.0–52.0)
Hemoglobin: 14.5 g/dL (ref 13.0–17.0)
MCH: 27.6 pg (ref 26.0–34.0)
MCHC: 33.3 g/dL (ref 30.0–36.0)
MCV: 82.7 fL (ref 80.0–100.0)
Platelets: 265 10*3/uL (ref 150–400)
RBC: 5.26 MIL/uL (ref 4.22–5.81)
RDW: 11.9 % (ref 11.5–15.5)
WBC: 6.7 10*3/uL (ref 4.0–10.5)
nRBC: 0 % (ref 0.0–0.2)

## 2019-04-30 MED ORDER — IPRATROPIUM-ALBUTEROL 0.5-2.5 (3) MG/3ML IN SOLN: 3.0000 mL | RESPIRATORY_TRACT | 0 refills | Status: DC | PRN

## 2019-04-30 MED ORDER — IPRATROPIUM-ALBUTEROL 0.5-2.5 (3) MG/3ML IN SOLN
3.0000 mL | Freq: Once | RESPIRATORY_TRACT | Status: AC
  Administered 2019-04-30: 08:00:00 3 mL via RESPIRATORY_TRACT
  Filled 2019-04-30: qty 3

## 2019-04-30 MED ORDER — FLUTICASONE-SALMETEROL 100-50 MCG/DOSE IN AEPB: 1.0000 | INHALATION_SPRAY | Freq: Two times a day (BID) | RESPIRATORY_TRACT | 0 refills | Status: DC

## 2019-04-30 MED ORDER — PREDNISONE 20 MG PO TABS
40.0000 mg | ORAL_TABLET | Freq: Once | ORAL | Status: AC
  Administered 2019-04-30: 40 mg via ORAL
  Filled 2019-04-30: qty 2

## 2019-04-30 MED ORDER — METHYLPREDNISOLONE SODIUM SUCC 125 MG IJ SOLR
125.0000 mg | INTRAMUSCULAR | Status: AC
  Administered 2019-04-30: 08:00:00 125 mg via INTRAVENOUS
  Filled 2019-04-30: qty 2

## 2019-04-30 MED ORDER — PREDNISONE 20 MG PO TABS: 40.0000 mg | ORAL_TABLET | Freq: Every day | ORAL | 0 refills | Status: DC

## 2019-04-30 MED ORDER — MAGNESIUM SULFATE 2 GM/50ML IV SOLN
2.0000 g | Freq: Once | INTRAVENOUS | Status: AC
  Administered 2019-04-30: 08:00:00 2 g via INTRAVENOUS
  Filled 2019-04-30: qty 50

## 2019-04-30 MED ORDER — ALBUTEROL SULFATE (2.5 MG/3ML) 0.083% IN NEBU: 2.5000 mg | INHALATION_SOLUTION | Freq: Four times a day (QID) | RESPIRATORY_TRACT | 1 refills | Status: DC | PRN

## 2019-04-30 MED ORDER — ALBUTEROL SULFATE (2.5 MG/3ML) 0.083% IN NEBU
7.5000 mg | INHALATION_SOLUTION | Freq: Once | RESPIRATORY_TRACT | Status: AC
  Administered 2019-04-30: 11:00:00 7.5 mg via RESPIRATORY_TRACT
  Filled 2019-04-30: qty 9

## 2019-04-30 NOTE — Discharge Instructions (Signed)
We believe that your symptoms are caused today by an exacerbation of your asthma.  Please take the prescribed medications and any medications that you have at home.  Follow up with your doctor as recommended.  If you develop any new or worsening symptoms, including but not limited to fever, persistent vomiting, worsening shortness of breath, or other symptoms that concern you, please return to the Emergency Department immediately.  

## 2019-04-30 NOTE — ED Triage Notes (Addendum)
Patient arrived by EMS from home for SOB. HX asthma. Used inhaler and albuterol at home with no relief. EMS administered 1 duoneb. Patient pursed lip breathing

## 2019-04-30 NOTE — ED Notes (Signed)
Patient given gingerale and graham crackers 

## 2019-04-30 NOTE — ED Provider Notes (Signed)
[facility] Emergency Department Provider Note   ____________________________________________   First MD Initiated Contact with Patient 04/30/19 [phone]     (approximate)  I have reviewed the triage vital signs and the nursing notes.   HISTORY  Chief Complaint Shortness of Breath    HPI John Schwartz is a 23 y.o. male here for evaluation for "asthma"  Patient reports he has a history of asthma, it flares up from time to time and has done so about a month ago as well.  For same symptoms started to experience a feeling of slight shortness of breath, wheezing, he was able to speak with says that he has a often dry cough.  He has not had any fevers chills headache loss of taste or loss of smell.  Denies nausea or vomiting.  Denies "chest pain" rather reports he just feels like wheezing from his asthma.  No leg swelling.  No history of blood clots.  No exposure to anyone with coronavirus.  Tells me is been tested for coronavirus a couple times in the last month or so through the ER for asthma and does have all been negative as well   Past Medical History:  Diagnosis Date   Asthma    Bronchospasm     Patient Active Problem List   Diagnosis Date Noted   Asthma with status asthmaticus 06/04/2016   Acute hypokalemia 06/04/2016   Noncompliance with medication treatment due to underuse of medication 06/04/2016   Acute renal insufficiency 06/04/2016   Acute asthma exacerbation 06/04/2016   Dyspnea 03/16/2016   Asthma exacerbation 03/16/2016   Severe persistent asthma 03/12/2014   Status asthmaticus 02/11/2014   Acute respiratory failure (HCC) 02/11/2014    Past Surgical History:  Procedure Laterality Date   NO PAST SURGERIES      Prior to Admission medications   Medication Sig Start Date End Date Taking? Authorizing Provider  albuterol (PROVENTIL HFA;VENTOLIN HFA) 108 (90 Base) MCG/ACT inhaler Inhale 2 puffs into the lungs every 6 (six)  hours as needed for wheezing or shortness of breath. 02/01/19  Yes Darci CurrentBrown, [place] N, MD  albuterol (PROVENTIL) (2.5 MG/3ML) 0.083% nebulizer solution Take 3 mLs (2.5 mg total) by nebulization every 6 (six) hours as needed for wheezing or shortness of breath. 02/01/19  Yes Darci CurrentBrown, [place] N, MD  PULMICORT FLEXHALER 90 MCG/ACT inhaler Inhale 2 puffs into the lungs 2 (two) times daily. 03/23/19  Yes [provider]  albuterol (PROVENTIL) (2.5 MG/3ML) 0.083% nebulizer solution Take 3 mLs (2.5 mg total) by nebulization every 6 (six) hours as needed for wheezing or shortness of breath. 04/30/19   Sharyn CreamerQuale, Sharanda Shinault, MD  Fluticasone-Salmeterol (ADVAIR DISKUS) 100-50 MCG/DOSE AEPB Inhale 1 puff into the lungs 2 (two) times daily. 04/30/19 04/29/20  Sharyn CreamerQuale, Aashrith Eves, MD  ipratropium-albuterol (DUONEB) 0.5-2.5 (3) MG/3ML SOLN Take 3 mLs by nebulization every 4 (four) hours as needed. 04/30/19   Sharyn CreamerQuale, Lorieann Argueta, MD  predniSONE (DELTASONE) 20 MG tablet Take 2 tablets (40 mg total) by mouth daily. 04/30/19   Sharyn CreamerQuale, Arbor Cohen, MD    Allergies Patient has no known allergies.  Family History  Problem Relation Age of Onset   Arthritis Paternal Grandmother     Social History Social History   Tobacco Use   Smoking status: Never Smoker   Smokeless tobacco: Never Used  Substance Use Topics   Alcohol use: No   Drug use: Not Currently    Types: Marijuana    Comment: Hx of smoking marajauna, last used  over a year ago    Review of Systems Constitutional: No fever/chills Eyes: No visual changes. ENT: No sore throat. Cardiovascular: Denies chest pain. Respiratory: See HPI Gastrointestinal: No abdominal pain.   Genitourinary: Negative for dysuria. Musculoskeletal: Negative for back pain. Skin: Negative for rash. Neurological: Negative for headaches, areas of focal weakness or numbness.    ____________________________________________   PHYSICAL EXAM:  VITAL SIGNS: ED Triage Vitals  Enc Vitals Group     BP  04/30/19 0725 (!) 135/93     Pulse Rate 04/30/19 0725 96     Resp 04/30/19 0725 18     Temp 04/30/19 0725 98.3 F (36.8 C)     Temp Source 04/30/19 0725 Oral     SpO2 04/30/19 0723 99 %     Weight 04/30/19 0726 172 lb (78 kg)     Height 04/30/19 0726 5\' 11"  (1.803 m)     Head Circumference --      Peak Flow --      Pain Score 04/30/19 0726 7     Pain Loc --      Pain Edu? --      Excl. in [place]? --     Constitutional: Alert and oriented. Well appearing and in no acute distress just slightly to moderately dyspneic. Eyes: Conjunctivae are normal. Head: Atraumatic. Nose: No congestion/rhinnorhea. Mouth/Throat: Mucous membranes are moist. Neck: No stridor.  Cardiovascular: Normal rate, regular rhythm. Grossly normal heart sounds.  Good peripheral circulation. Respiratory: Slightly increased respiratory effort.  There is some slight pursing of his lips, very minimal accessory muscle use but normal rate.  He does have moderate expiratory wheezing throughout.  No crackles. Gastrointestinal: Soft and nontender. No distention. Musculoskeletal: No lower extremity tenderness nor edema. Neurologic:  Normal speech and language. No gross focal neurologic deficits are appreciated.  Skin:  Skin is warm, dry and intact. No rash noted. Psychiatric: Mood and affect are normal. Speech and behavior are normal.  ____________________________________________   LABS (all labs ordered are listed, but only abnormal results are displayed)  Labs Reviewed  CBC  BASIC METABOLIC PANEL   ____________________________________________  EKG  Reviewed entered by me at 730 Heart rate 105 QRS 80 QTc 410 Sinus tachycardia, somewhat unusual RSR prime pattern is noted with fairly elevated R waves in the inferior distribution, but when compared to his previous EKGs as well no notable changes found.  Not see any signs or evidence of an acute ischemia ____________________________________________  RADIOLOGY  Dg  Chest Portable 1 View  Result Date: 04/30/2019 CLINICAL DATA:  Cough and shortness of breath beginning last night. Asthma. EXAM: PORTABLE CHEST 1 VIEW COMPARISON:  04/04/2019 FINDINGS: The heart size and mediastinal contours are within normal limits. Both lungs are clear. The visualized skeletal structures are unremarkable. IMPRESSION: No active disease. Electronically Signed   By: Earle Gell M.D.   On: 04/30/2019 08:12    ____________________________________________   PROCEDURES  Procedure(s) performed: None  Procedures  Critical Care performed: No  ____________________________________________   INITIAL IMPRESSION / ASSESSMENT AND PLAN / ED COURSE  Pertinent labs & imaging results that were available during my care of the patient were reviewed by me and considered in my medical decision making (see chart for details).   Patient presents for shortness of breath.  Denies symptoms that would suggest concomitant COVID and he denies exposure history. John Schwartz was evaluated in Emergency Department on 04/30/2019 for the symptoms described in the history of present illness. He was evaluated in  the context of the global COVID-19 pandemic, which necessitated consideration that the patient might be at risk for infection with the SARS-CoV-2 virus that causes COVID-19. Institutional protocols and algorithms that pertain to the evaluation of patients at risk for COVID-19 are in a state of rapid change based on information released by regulatory bodies including the CDC and federal and state organizations. These policies and algorithms were followed during the patient's care in the ED.  Clinical history and examination appear consistent with an asthma exacerbation, moderate intensity at present.  I will order nebs, he is responded well to magnesium infusion and steroids in the past which I have also ordered.  Continue to monitor him closely, he does report that he has an inhaler and nebulizer  machine at home already, will continue to monitor him in hopes that he will improve and be able to be discharged but we will continue to keep close attention.  There are no signs or symptoms of acute cardiac disease.  No infectious symptoms.  No pneumothorax, no signs or symptoms suggest pulmonary embolism or dissection.  Clinical Course as of Apr 29 1448  Sun Apr 30, 2019  0821 Chest x-ray reviewed, negative for acute   [MQ]  1008 Patient reports he is feeling better, has been up and use the bathroom and is ambulatory now without distress.  He does still have notable end expiratory wheezing reports he can still feel congestion and some wheezing.  He like to try a few more nebulizer treatments and then with a plan to be able to go home after.  I think she will likely work well he is doing well, appears overall nontoxic but is still wheezing with expiratory wheezing.  Will give additional albuterol and reevaluate.   [MQ]    Clinical Course User Index [MQ] Sharyn CreamerQuale, Maanya Hippert, MD    Patient resting comfortably, feels much better after additional nebs.  Lung sounds are now completely clear.  Normal work of breathing.  Is awake and alert just slightly tachycardic likely from albuterol but asymptomatic.  Patient comfortable with plan for discharge, he has albuterol inhaler but did need refill of some of his nebulizer solutions as well as prescription for prednisone which I have provided him.  Also request Advair refill.  Return precautions and treatment recommendations and follow-up discussed with the patient who is agreeable with the plan.  ____________________________________________   FINAL CLINICAL IMPRESSION(S) / ED DIAGNOSES  Final diagnoses:  Exacerbation of intermittent asthma, unspecified asthma severity        Note:  This document was prepared using Dragon voice recognition software and may include unintentional dictation errors       Sharyn CreamerQuale, Setsuko Robins, MD 04/30/19 1449

## 2019-05-26 ENCOUNTER — Other Ambulatory Visit: Payer: Self-pay

## 2019-05-26 ENCOUNTER — Observation Stay
Admission: EM | Admit: 2019-05-26 | Discharge: 2019-05-27 | Disposition: A | Discharge status: Home / Self Care | Payer: Medicaid Other | Attending: Internal Medicine | Admitting: Internal Medicine

## 2019-05-26 ENCOUNTER — Emergency Department: Payer: Medicaid Other

## 2019-05-26 DIAGNOSIS — Z79899 Other long term (current) drug therapy: Secondary | ICD-10-CM | POA: Insufficient documentation

## 2019-05-26 DIAGNOSIS — R000 Tachycardia, unspecified: Secondary | ICD-10-CM | POA: Diagnosis not present

## 2019-05-26 DIAGNOSIS — Z7951 Long term (current) use of inhaled steroids: Secondary | ICD-10-CM | POA: Diagnosis not present

## 2019-05-26 DIAGNOSIS — Z1159 Encounter for screening for other viral diseases: Secondary | ICD-10-CM | POA: Diagnosis not present

## 2019-05-26 DIAGNOSIS — J4551 Severe persistent asthma with (acute) exacerbation: Principal | ICD-10-CM | POA: Insufficient documentation

## 2019-05-26 DIAGNOSIS — J4550 Severe persistent asthma, uncomplicated: Secondary | ICD-10-CM | POA: Diagnosis present

## 2019-05-26 LAB — COMPREHENSIVE METABOLIC PANEL
ALT: 41 U/L (ref 0–44)
AST: 25 U/L (ref 15–41)
Albumin: 4.4 g/dL (ref 3.5–5.0)
Alkaline Phosphatase: 51 U/L (ref 38–126)
Anion gap: 7 (ref 5–15)
BUN: 15 mg/dL (ref 6–20)
CO2: 24 mmol/L (ref 22–32)
Calcium: 8.9 mg/dL (ref 8.9–10.3)
Chloride: 108 mmol/L (ref 98–111)
Creatinine, Ser: 1.16 mg/dL (ref 0.61–1.24)
GFR calc Af Amer: >60 mL/min (ref >60)
GFR calc non Af Amer: >60 mL/min (ref >60)
Glucose, Bld: 90 mg/dL (ref 70–99)
Potassium: 3.7 mmol/L (ref 3.5–5.1)
Sodium: 139 mmol/L (ref 135–145)
Total Bilirubin: 0.3 mg/dL (ref 0.3–1.2)
Total Protein: 6.9 g/dL (ref 6.5–8.1)

## 2019-05-26 LAB — CBC WITH DIFFERENTIAL/PLATELET
Abs Immature Granulocytes: 0.04 10*3/uL (ref 0.00–0.07)
Basophils Absolute: 0.1 10*3/uL (ref 0.0–0.1)
Basophils Relative: 1 %
Eosinophils Absolute: 2 10*3/uL — ABNORMAL HIGH (ref 0.0–0.5)
Eosinophils Relative: 20 %
HCT: 45.8 % (ref 39.0–52.0)
Hemoglobin: 15.2 g/dL (ref 13.0–17.0)
Immature Granulocytes: 0 %
Lymphocytes Relative: 26 %
Lymphs Abs: 2.8 10*3/uL (ref 0.7–4.0)
MCH: 27.4 pg (ref 26.0–34.0)
MCHC: 33.2 g/dL (ref 30.0–36.0)
MCV: 82.5 fL (ref 80.0–100.0)
Monocytes Absolute: 0.8 10*3/uL (ref 0.1–1.0)
Monocytes Relative: 7 %
Neutro Abs: 4.8 10*3/uL (ref 1.7–7.7)
Neutrophils Relative %: 46 %
Platelets: 296 10*3/uL (ref 150–400)
RBC: 5.55 MIL/uL (ref 4.22–5.81)
RDW: 12.2 % (ref 11.5–15.5)
WBC: 10.5 10*3/uL (ref 4.0–10.5)
nRBC: 0 % (ref 0.0–0.2)

## 2019-05-26 LAB — TSH: TSH: 2.725 u[IU]/mL (ref 0.350–4.500)

## 2019-05-26 LAB — MAGNESIUM: Magnesium: 2 mg/dL (ref 1.7–2.4)

## 2019-05-26 LAB — SARS CORONAVIRUS 2 BY RT PCR (HOSPITAL ORDER, PERFORMED IN ~~LOC~~ HOSPITAL LAB): SARS Coronavirus 2: NEGATIVE

## 2019-05-26 MED ORDER — DILTIAZEM HCL 30 MG PO TABS
30.0000 mg | ORAL_TABLET | Freq: Three times a day (TID) | ORAL | Status: DC
  Administered 2019-05-26 (×2): 30 mg via ORAL
  Filled 2019-05-26 (×5): qty 1

## 2019-05-26 MED ORDER — ALBUTEROL SULFATE (2.5 MG/3ML) 0.083% IN NEBU
2.5000 mg | INHALATION_SOLUTION | RESPIRATORY_TRACT | Status: DC | PRN
  Administered 2019-05-26: 2.5 mg via RESPIRATORY_TRACT
  Filled 2019-05-26: qty 3

## 2019-05-26 MED ORDER — IPRATROPIUM-ALBUTEROL 0.5-2.5 (3) MG/3ML IN SOLN
3.0000 mL | Freq: Once | RESPIRATORY_TRACT | Status: AC
  Administered 2019-05-26: 3 mL via RESPIRATORY_TRACT
  Filled 2019-05-26: qty 3

## 2019-05-26 MED ORDER — MOMETASONE FURO-FORMOTEROL FUM 200-5 MCG/ACT IN AERO
2.0000 | INHALATION_SPRAY | Freq: Two times a day (BID) | RESPIRATORY_TRACT | Status: DC
  Administered 2019-05-26 – 2019-05-27 (×3): 2 via RESPIRATORY_TRACT
  Filled 2019-05-26: qty 8.8

## 2019-05-26 MED ORDER — DOCUSATE SODIUM 100 MG PO CAPS
100.0000 mg | ORAL_CAPSULE | Freq: Two times a day (BID) | ORAL | Status: DC
  Administered 2019-05-27: 100 mg via ORAL
  Filled 2019-05-26 (×3): qty 1

## 2019-05-26 MED ORDER — PREDNISONE 50 MG PO TABS
50.0000 mg | ORAL_TABLET | Freq: Every day | ORAL | Status: DC
  Administered 2019-05-26 – 2019-05-27 (×2): 50 mg via ORAL
  Filled 2019-05-26 (×2): qty 1

## 2019-05-26 MED ORDER — ACETAMINOPHEN 650 MG RE SUPP: 650.0000 mg | Freq: Four times a day (QID) | RECTAL | Status: DC | PRN

## 2019-05-26 MED ORDER — IPRATROPIUM-ALBUTEROL 0.5-2.5 (3) MG/3ML IN SOLN
3.0000 mL | RESPIRATORY_TRACT | Status: DC
  Administered 2019-05-26 – 2019-05-27 (×5): 3 mL via RESPIRATORY_TRACT
  Filled 2019-05-26 (×7): qty 3

## 2019-05-26 MED ORDER — ENOXAPARIN SODIUM 40 MG/0.4ML ~~LOC~~ SOLN
40.0000 mg | SUBCUTANEOUS | Status: DC
  Filled 2019-05-26: qty 0.4

## 2019-05-26 MED ORDER — ONDANSETRON HCL 4 MG/2ML IJ SOLN: 4.0000 mg | Freq: Four times a day (QID) | INTRAMUSCULAR | Status: DC | PRN

## 2019-05-26 MED ORDER — ONDANSETRON HCL 4 MG PO TABS: 4.0000 mg | ORAL_TABLET | Freq: Four times a day (QID) | ORAL | Status: DC | PRN

## 2019-05-26 MED ORDER — ACETAMINOPHEN 325 MG PO TABS
650.0000 mg | ORAL_TABLET | Freq: Four times a day (QID) | ORAL | Status: DC | PRN
  Administered 2019-05-26: 650 mg via ORAL
  Filled 2019-05-26: qty 2

## 2019-05-26 MED ORDER — ALBUTEROL SULFATE (2.5 MG/3ML) 0.083% IN NEBU
5.0000 mg | INHALATION_SOLUTION | Freq: Once | RESPIRATORY_TRACT | Status: AC
  Administered 2019-05-26: 02:00:00 5 mg via RESPIRATORY_TRACT
  Filled 2019-05-26: qty 6

## 2019-05-26 MED ORDER — ALBUTEROL SULFATE (2.5 MG/3ML) 0.083% IN NEBU
5.0000 mg | INHALATION_SOLUTION | Freq: Once | RESPIRATORY_TRACT | Status: AC
  Administered 2019-05-26: 5 mg via RESPIRATORY_TRACT
  Filled 2019-05-26: qty 6

## 2019-05-26 MED ORDER — MAGNESIUM SULFATE 2 GM/50ML IV SOLN
2.0000 g | Freq: Once | INTRAVENOUS | Status: AC
  Administered 2019-05-26: 2 g via INTRAVENOUS
  Filled 2019-05-26: qty 50

## 2019-05-26 MED ORDER — METHYLPREDNISOLONE SODIUM SUCC 125 MG IJ SOLR
125.0000 mg | Freq: Once | INTRAMUSCULAR | Status: AC
  Administered 2019-05-26: 125 mg via INTRAVENOUS
  Filled 2019-05-26: qty 2

## 2019-05-26 NOTE — ED Notes (Signed)
Pt o2 down to 2L [place] sats at 96

## 2019-05-26 NOTE — Progress Notes (Signed)
[employer] [facility] [place] [facility]   PATIENT NAME: Sabas SousRaheim Treichler    MR#:  [id number]  DATE OF BIRTH:  Sep 10, 1996  SUBJECTIVE:  CHIEF COMPLAINT:   Chief Complaint  Patient presents with  . Asthma   Came with asthma exacerbation.  Feels slightly better.  Have tachycardia. REVIEW OF SYSTEMS:  CONSTITUTIONAL: No fever, fatigue or weakness.  EYES: No blurred or double vision.  EARS, NOSE, AND THROAT: No tinnitus or ear pain.  RESPIRATORY: No cough, shortness of breath, wheezing or hemoptysis.  CARDIOVASCULAR: No chest pain, orthopnea, edema.  GASTROINTESTINAL: No nausea, vomiting, diarrhea or abdominal pain.  GENITOURINARY: No dysuria, hematuria.  ENDOCRINE: No polyuria, nocturia,  HEMATOLOGY: No anemia, easy bruising or bleeding SKIN: No rash or lesion. MUSCULOSKELETAL: No joint pain or arthritis.   NEUROLOGIC: No tingling, numbness, weakness.  PSYCHIATRY: No anxiety or depression.   ROS  DRUG ALLERGIES:  No Known Allergies  VITALS:  Blood pressure (!) 145/80, pulse (!) 113, temperature 98.7 F (37.1 C), temperature source Oral, resp. rate 16, height 5\' 11"  (1.803 m), weight 78 kg, SpO2 96 %.  PHYSICAL EXAMINATION:  GENERAL:  23 y.o.-year-old patient lying in the bed with no acute distress.  EYES: Pupils equal, round, reactive to light and accommodation. No scleral icterus. Extraocular muscles intact.  HEENT: Head atraumatic, normocephalic. Oropharynx and nasopharynx clear.  NECK:  Supple, no jugular venous distention. No thyroid enlargement, no tenderness.  LUNGS: Normal breath sounds bilaterally, minimal wheezing, no crepitation. No use of accessory muscles of respiration.  CARDIOVASCULAR: S1, S2 normal. No murmurs, rubs, or gallops.  ABDOMEN: Soft, nontender, nondistended. Bowel sounds present. No organomegaly or mass.  EXTREMITIES: No pedal edema, cyanosis, or clubbing.  NEUROLOGIC: Cranial nerves II through XII are intact. Muscle strength 5/5 in all  extremities. Sensation intact. Gait not checked.  PSYCHIATRIC: The patient is alert and oriented x 3.  SKIN: No obvious rash, lesion, or ulcer.   Physical Exam LABORATORY PANEL:   CBC Recent Labs  Lab 05/26/19 0136  WBC 10.5  HGB 15.2  HCT 45.8  PLT 296   ------------------------------------------------------------------------------------------------------------------  Chemistries  Recent Labs  Lab 05/26/19 0136  NA 139  K 3.7  CL 108  CO2 24  GLUCOSE 90  BUN 15  CREATININE 1.16  CALCIUM 8.9  MG 2.0  AST 25  ALT 41  ALKPHOS 51  BILITOT 0.3   ------------------------------------------------------------------------------------------------------------------  Cardiac Enzymes No results for input(s): TROPONINI in the last 168 hours. ------------------------------------------------------------------------------------------------------------------  RADIOLOGY:  Dg Chest Portable 1 View  Result Date: 05/26/2019 CLINICAL DATA:  Shortness of breath EXAM: PORTABLE CHEST 1 VIEW COMPARISON:  April 30, 2019 FINDINGS: The lungs are somewhat hyperexpanded. The heart size is normal. There is no pneumothorax. No large pleural effusion. There is no acute osseous abnormality. IMPRESSION: Hyperexpanded lungs, otherwise no acute cardiopulmonary process. Electronically Signed   By: Katherine Mantlehristopher  Green M.D.   On: 05/26/2019 03:28    ASSESSMENT AND PLAN:   Active Problems:   Severe persistent asthma   1.  Asthma: Severe, persistent; with exacerbation.  Continue steroids as well as DuoNeb treatments.   on an inhaled corticosteroid as well as long-acting bronchial agonist.  Address triggers and reinforced asthma action plan. 2.  DVT prophylaxis: Lovenox 3.  GI prophylaxis: None 4.  Tachycardia-likely due to respiratory issues, will give oral Cardizem to help.  All the records are reviewed and case discussed with Care Management/Social Workerr. Management plans discussed with the  patient, family and  they are in agreement.  CODE STATUS: full.  TOTAL TIME TAKING CARE OF THIS PATIENT: 35 minutes.     POSSIBLE D/C IN 1-2 DAYS, DEPENDING ON CLINICAL CONDITION.   Vaughan Basta M.D on 05/26/2019   Between 7am to 6pm - Pager - [phone]  After 6pm go to [email or web address] - password [email or web address] [facility]  Office  [phone]  CC: Primary care physician; Patient, No Pcp Per  Note: This dictation was prepared with Dragon dictation along with smaller phrase technology. Any transcriptional errors that result from this process are unintentional.

## 2019-05-26 NOTE — H&P (Signed)
John Schwartz is an 23 y.o. male.   Chief Complaint: Shortness of breath HPI: The patient with past medical history of asthma with multiple ED visits in the last few months presents to the emergency department with shortness of breath.  He has never been intubated.  The patient reports that he became acutely short of breath this afternoon.  Breathing treatments at home did not help.  He received Solu-Medrol 125 mg in the emergency department as well as magnesium and multiple breathing treatments.  He continues to wheeze and require intermittent supplemental oxygen which prompted the emergency department staff call hospitalist service for admission.  Past Medical History:  Diagnosis Date  . Asthma   . Bronchospasm     Past Surgical History:  Procedure Laterality Date  . NO PAST SURGERIES      Family History  Problem Relation Age of Onset  . Arthritis Paternal Grandmother    Social History:  reports that he has never smoked. He has never used smokeless tobacco. He reports previous drug use. Drug: Marijuana. He reports that he does not drink alcohol.  Allergies: No Known Allergies  Medications Prior to Admission  Medication Sig Dispense Refill  . albuterol (PROVENTIL HFA;VENTOLIN HFA) 108 (90 Base) MCG/ACT inhaler Inhale 2 puffs into the lungs every 6 (six) hours as needed for wheezing or shortness of breath. 1 Inhaler 0  . albuterol (PROVENTIL) (2.5 MG/3ML) 0.083% nebulizer solution Take 3 mLs (2.5 mg total) by nebulization every 6 (six) hours as needed for wheezing or shortness of breath. 75 mL 1  . Fluticasone-Salmeterol (ADVAIR DISKUS) 100-50 MCG/DOSE AEPB Inhale 1 puff into the lungs 2 (two) times daily. 1 each 0  . ipratropium-albuterol (DUONEB) 0.5-2.5 (3) MG/3ML SOLN Take 3 mLs by nebulization every 4 (four) hours as needed. 360 mL 0  . predniSONE (DELTASONE) 20 MG tablet Take 2 tablets (40 mg total) by mouth daily. (Patient not taking: Reported on 05/26/2019) 8 tablet 0  .  PULMICORT FLEXHALER 90 MCG/ACT inhaler Inhale 2 puffs into the lungs 2 (two) times daily.      Results for orders placed or performed during the hospital encounter of 05/26/19 (from the past 48 hour(s))  SARS Coronavirus 2 (CEPHEID- Performed in [facility] hospital lab), Hosp Order     Status: None   Collection Time: 05/26/19  1:36 AM   Specimen: Nasopharyngeal Swab  Result Value Ref Range   SARS Coronavirus 2 NEGATIVE NEGATIVE    Comment: (NOTE) If result is NEGATIVE SARS-CoV-2 target nucleic acids are NOT DETECTED. The SARS-CoV-2 RNA is generally detectable in upper and lower  respiratory specimens during the acute phase of infection. The lowest  concentration of SARS-CoV-2 viral copies this assay can detect is 250  copies / mL. A negative result does not preclude SARS-CoV-2 infection  and should not be used as the sole basis for treatment or other  patient management decisions.  A negative result may occur with  improper specimen collection / handling, submission of specimen other  than nasopharyngeal swab, presence of viral mutation(s) within the  areas targeted by this assay, and inadequate number of viral copies  (<250 copies / mL). A negative result must be combined with clinical  observations, patient history, and epidemiological information. If result is POSITIVE SARS-CoV-2 target nucleic acids are DETECTED. The SARS-CoV-2 RNA is generally detectable in upper and lower  respiratory specimens dur ing the acute phase of infection.  Positive  results are indicative of active infection with SARS-CoV-2.  Clinical  correlation with patient history and other diagnostic information is  necessary to determine patient infection status.  Positive results do  not rule out bacterial infection or co-infection with other viruses. If result is PRESUMPTIVE POSTIVE SARS-CoV-2 nucleic acids MAY BE PRESENT.   A presumptive positive result was obtained on the submitted specimen  and confirmed  on repeat testing.  While 2019 novel coronavirus  (SARS-CoV-2) nucleic acids may be present in the submitted sample  additional confirmatory testing may be necessary for epidemiological  and / or clinical management purposes  to differentiate between  SARS-CoV-2 and other Sarbecovirus currently known to infect humans.  If clinically indicated additional testing with an alternate test  methodology [phone]([id number]) is advised. The SARS-CoV-2 RNA is generally  detectable in upper and lower respiratory sp ecimens during the acute  phase of infection. The expected result is Negative. Fact Sheet for Patients:  BoilerBrush.com.cyhttps://www.fda.gov/media/136312/download Fact Sheet for Healthcare Providers: https://pope.com/https://www.fda.gov/media/136313/download This test is not yet approved or cleared by the [place] FDA and has been authorized for detection and/or diagnosis of SARS-CoV-2 by FDA under an Emergency Use Authorization (EUA).  This EUA will remain in effect (meaning this test can be used) for the duration of the COVID-19 declaration under Section 564(b)(1) of the Act, 21 U.S.C. section 360bbb-3(b)(1), unless the authorization is terminated or revoked sooner. Performed at [facility], [address] [place], [place] [address]   CBC with Differential/Platelet     Status: Abnormal   Collection Time: 05/26/19  1:36 AM  Result Value Ref Range   WBC 10.5 4.0 - 10.5 K/uL   RBC 5.55 4.22 - 5.81 MIL/uL   Hemoglobin 15.2 13.0 - 17.0 g/dL   HCT 95.645.8 21.339.0 - 08.652.0 %   MCV 82.5 80.0 - 100.0 fL   MCH 27.4 26.0 - 34.0 pg   MCHC 33.2 30.0 - 36.0 g/dL   RDW 57.812.2 46.911.5 - 62.915.5 %   Platelets 296 150 - 400 K/uL   nRBC 0.0 0.0 - 0.2 %   Neutrophils Relative % 46 %   Neutro Abs 4.8 1.7 - 7.7 K/uL   Lymphocytes Relative 26 %   Lymphs Abs 2.8 0.7 - 4.0 K/uL   Monocytes Relative 7 %   Monocytes Absolute 0.8 0.1 - 1.0 K/uL   Eosinophils Relative 20 %   Eosinophils Absolute 2.0 (H) 0.0 - 0.5 K/uL   Basophils Relative  1 %   Basophils Absolute 0.1 0.0 - 0.1 K/uL   Immature Granulocytes 0 %   Abs Immature Granulocytes 0.04 0.00 - 0.07 K/uL    Comment: Performed at [facility], [address] [place], [place] [address]  Comprehensive metabolic panel     Status: None   Collection Time: 05/26/19  1:36 AM  Result Value Ref Range   Sodium 139 135 - 145 mmol/L   Potassium 3.7 3.5 - 5.1 mmol/L   Chloride 108 98 - 111 mmol/L   CO2 24 22 - 32 mmol/L   Glucose, Bld 90 70 - 99 mg/dL   BUN 15 6 - 20 mg/dL   Creatinine, Ser 3.241.16 0.61 - 1.24 mg/dL   Calcium 8.9 8.9 - 40.110.3 mg/dL   Total Protein 6.9 6.5 - 8.1 g/dL   Albumin 4.4 3.5 - 5.0 g/dL   AST 25 15 - 41 U/L   ALT 41 0 - 44 U/L   Alkaline Phosphatase 51 38 - 126 U/L   Total Bilirubin 0.3 0.3 - 1.2 mg/dL   GFR  calc non Af Amer >60 >60 mL/min   GFR calc Af Amer >60 >60 mL/min   Anion gap 7 5 - 15    Comment: Performed at [facility], [address] [place], [place] [address]  Magnesium     Status: None   Collection Time: 05/26/19  1:36 AM  Result Value Ref Range   Magnesium 2.0 1.7 - 2.4 mg/dL    Comment: Performed at [facility], [address] [place], [place] [address]  TSH     Status: None   Collection Time: 05/26/19  1:36 AM  Result Value Ref Range   TSH 2.725 0.350 - 4.500 uIU/mL    Comment: Performed by a 3rd Generation assay with a functional sensitivity of <=0.01 uIU/mL. Performed at [facility], [address] [place], [place] [address]    Dg Chest Portable 1 View  Result Date: 05/26/2019 CLINICAL DATA:  Shortness of breath EXAM: PORTABLE CHEST 1 VIEW COMPARISON:  April 30, 2019 FINDINGS: The lungs are somewhat hyperexpanded. The heart size is normal. There is no pneumothorax. No large pleural effusion. There is no acute osseous abnormality. IMPRESSION: Hyperexpanded lungs, otherwise no acute cardiopulmonary process. Electronically Signed   By: Katherine Mantlehristopher  Green M.D.   On: 05/26/2019 03:28     Review of Systems  Constitutional: Negative for chills and fever.  HENT: Negative for sore throat and tinnitus.   Eyes: Negative for blurred vision and redness.  Respiratory: Positive for cough, shortness of breath and wheezing.   Cardiovascular: Negative for chest pain, palpitations, orthopnea and PND.  Gastrointestinal: Negative for abdominal pain, diarrhea, nausea and vomiting.  Genitourinary: Negative for dysuria, frequency and urgency.  Musculoskeletal: Negative for joint pain and myalgias.  Skin: Negative for rash.       No lesions  Neurological: Negative for speech change, focal weakness and weakness.  Endo/Heme/Allergies: Does not bruise/bleed easily.       No temperature intolerance  Psychiatric/Behavioral: Negative for depression and suicidal ideas.    Blood pressure 128/85, pulse (!) 111, temperature 98.3 F (36.8 C), temperature source Oral, resp. rate 20, weight 78 kg, SpO2 97 %. Physical Exam  Vitals reviewed. Constitutional: He is oriented to person, place, and time. He appears well-developed and well-nourished. No distress.  HENT:  Head: Normocephalic and atraumatic.  Mouth/Throat: Oropharynx is clear and moist.  Eyes: Pupils are equal, round, and reactive to light. Conjunctivae and EOM are normal. No scleral icterus.  Neck: Normal range of motion. Neck supple. No JVD present. No tracheal deviation present. No thyromegaly present.  Cardiovascular: Normal rate, regular rhythm and normal heart sounds. Exam reveals no gallop and no friction rub.  No murmur heard. Respiratory: Effort normal and breath sounds normal. No respiratory distress.  GI: Soft. Bowel sounds are normal. He exhibits no distension. There is no abdominal tenderness.  Genitourinary:    Genitourinary Comments: Deferred   Musculoskeletal: Normal range of motion.        General: No edema.  Lymphadenopathy:    He has no cervical adenopathy.  Neurological: He is alert and oriented to person, place,  and time. No cranial nerve deficit.  Skin: Skin is warm and dry. No rash noted. No erythema.  Psychiatric: He has a normal mood and affect. His behavior is normal. Judgment and thought content normal.     Assessment/Plan This is a 23 year old male admitted for asthma exacerbation. 1.  Asthma: Severe, persistent; with exacerbation.  Continue steroids as well as DuoNeb treatments.  I  will start the patient on an inhaled corticosteroid as well as long-acting bronchial agonist.  Address triggers and reinforced asthma action plan. 2.  DVT prophylaxis: Lovenox 3.  GI prophylaxis: None The patient is a full code.  Time spent on admission orders and patient care approximately 45 minutes  Arnaldo Nataliamond,  Trinisha Paget S, MD 05/26/2019, 8:38 AM

## 2019-05-26 NOTE — ED Provider Notes (Signed)
[facility] Emergency Department Provider Note  ____________________________________________   First MD Initiated Contact with Patient 05/26/19 0126     (approximate)  I have reviewed the triage vital signs and the nursing notes.   HISTORY  Chief Complaint Asthma  Level 5 caveat:  history/ROS limited by acute/critical illness  HPI John Schwartz is a 23 y.o. male history of severe asthma and bronchospasm who presents by private vehicle for evaluation of severe shortness of breath.  He says it is been gradually getting worse over the last couple of days and feels consistent with his prior asthma exacerbations.  He has had approximately 1 visit a month for the last 6 months to the ED, possibly more.  He claims he has been using his inhaler and his nebulizer at home but that is not helping.  He has been staying to himself and has not been around anyone diagnosed with COVID-19.  He does have a cough which is consistent with his asthma exacerbations, but he denies sore throat, chest pain, nausea, vomiting, abdominal pain, fever/chills, and dysuria.  He says the symptoms feel little bit worse than last exacerbation.  He is speaking only a few words at a time and cannot speak in full sentences currently and is tripoding in order to improve his respirations.         Past Medical History:  Diagnosis Date  . Asthma   . Bronchospasm     Patient Active Problem List   Diagnosis Date Noted  . Asthma with status asthmaticus 06/04/2016  . Acute hypokalemia 06/04/2016  . Noncompliance with medication treatment due to underuse of medication 06/04/2016  . Acute renal insufficiency 06/04/2016  . Acute asthma exacerbation 06/04/2016  . Dyspnea 03/16/2016  . Asthma exacerbation 03/16/2016  . Severe persistent asthma 03/12/2014  . Status asthmaticus 02/11/2014  . Acute respiratory failure (HCC) 02/11/2014    Past Surgical History:  Procedure Laterality Date  . NO  PAST SURGERIES      Prior to Admission medications   Medication Sig Start Date End Date Taking? Authorizing Provider  albuterol (PROVENTIL HFA;VENTOLIN HFA) 108 (90 Base) MCG/ACT inhaler Inhale 2 puffs into the lungs every 6 (six) hours as needed for wheezing or shortness of breath. 02/01/19   Darci CurrentBrown, [place] N, MD  albuterol (PROVENTIL) (2.5 MG/3ML) 0.083% nebulizer solution Take 3 mLs (2.5 mg total) by nebulization every 6 (six) hours as needed for wheezing or shortness of breath. 02/01/19   Darci CurrentBrown, [place] N, MD  albuterol (PROVENTIL) (2.5 MG/3ML) 0.083% nebulizer solution Take 3 mLs (2.5 mg total) by nebulization every 6 (six) hours as needed for wheezing or shortness of breath. 04/30/19   Sharyn CreamerQuale, Mark, MD  Fluticasone-Salmeterol (ADVAIR DISKUS) 100-50 MCG/DOSE AEPB Inhale 1 puff into the lungs 2 (two) times daily. 04/30/19 04/29/20  Sharyn CreamerQuale, Mark, MD  ipratropium-albuterol (DUONEB) 0.5-2.5 (3) MG/3ML SOLN Take 3 mLs by nebulization every 4 (four) hours as needed. 04/30/19   Sharyn CreamerQuale, Mark, MD  predniSONE (DELTASONE) 20 MG tablet Take 2 tablets (40 mg total) by mouth daily. 04/30/19   Sharyn CreamerQuale, Mark, MD  PULMICORT FLEXHALER 90 MCG/ACT inhaler Inhale 2 puffs into the lungs 2 (two) times daily. 03/23/19   [provider]    Allergies Patient has no known allergies.  Family History  Problem Relation Age of Onset  . Arthritis Paternal Grandmother     Social History Social History   Tobacco Use  . Smoking status: Never Smoker  . Smokeless tobacco: Never Used  Substance Use Topics  . Alcohol use: No  . Drug use: Not Currently    Types: Marijuana    Comment: Hx of smoking marajauna, last used over a year ago    Review of Systems Level 5 caveat:  history/ROS limited by acute/critical illness   Constitutional: No fever/chills Eyes: No visual changes. ENT: No sore throat. Cardiovascular: Denies chest pain. Respiratory: +shortness of breath and cough consistent with asthma exacerbation  Gastrointestinal: No abdominal pain.  No nausea, no vomiting.  No diarrhea.  No constipation. Genitourinary: Negative for dysuria. Musculoskeletal: Negative for neck pain.  Negative for back pain. Integumentary: Negative for rash. Neurological: Negative for headaches, focal weakness or numbness.   ____________________________________________   PHYSICAL EXAM:  VITAL SIGNS: ED Triage Vitals [05/26/19 0116]  Enc Vitals Group     BP (!) 145/104     Pulse Rate (!) 110     Resp (!) 25     Temp 99.3 F (37.4 C)     Temp src      SpO2 91 %     Weight 78 kg (171 lb 15.3 oz)     Height      Head Circumference      Peak Flow      Pain Score 8     Pain Loc      Pain Edu?      Excl. in GC?     Constitutional: Alert and oriented.  Moderate respiratory distress.  Not currently tripoding but is sitting up quite straight in bed Eyes: Conjunctivae are normal.  Head: Atraumatic. Nose: No congestion/rhinnorhea. Mouth/Throat: Mucous membranes are moist. Neck: No stridor.  No meningeal signs.   Cardiovascular: Tachycardia, regular rhythm. Good peripheral circulation. Grossly normal heart sounds. Respiratory: Increased respiratory effort with intercostal muscle retractions and accessory muscle usage.  Decreased air movement throughout with severe expiratory wheezes and some inspiratory wheezing.  Speaking in short phrases. Gastrointestinal: Soft and nontender. No distention.  Musculoskeletal: No lower extremity tenderness nor edema. No gross deformities of extremities. Neurologic:  Normal speech and language. No gross focal neurologic deficits are appreciated.  Skin:  Skin is warm, dry and intact. No rash noted. Psychiatric: Mood and affect are normal. Speech and behavior are normal.  ____________________________________________   LABS (all labs ordered are listed, but only abnormal results are displayed)  Labs Reviewed  CBC WITH DIFFERENTIAL/PLATELET - Abnormal; Notable for the  following components:      Result Value   Eosinophils Absolute 2.0 (*)    All other components within normal limits  SARS CORONAVIRUS 2 (HOSPITAL ORDER, PERFORMED IN [facility])  COMPREHENSIVE METABOLIC PANEL  MAGNESIUM   ____________________________________________  EKG  No indication for EKG ____________________________________________  RADIOLOGY I, Loleta Roseory Katara Griner, personally viewed and evaluated these images (plain radiographs) as part of my medical decision making, as well as reviewing the written report by the radiologist.  ED MD interpretation:  No acute abnormality; hyperinflation  Official radiology report(s): Dg Chest Portable 1 View  Result Date: 05/26/2019 CLINICAL DATA:  Shortness of breath EXAM: PORTABLE CHEST 1 VIEW COMPARISON:  April 30, 2019 FINDINGS: The lungs are somewhat hyperexpanded. The heart size is normal. There is no pneumothorax. No large pleural effusion. There is no acute osseous abnormality. IMPRESSION: Hyperexpanded lungs, otherwise no acute cardiopulmonary process. Electronically Signed   By: Katherine Mantlehristopher  Green M.D.   On: 05/26/2019 03:28    ____________________________________________   PROCEDURES   Procedure(s) performed (including Critical Care):  .Critical Care Performed by:  Hinda Kehr, MD Authorized by: Hinda Kehr, MD   Critical care provider statement:    Critical care time (minutes):  30   Critical care time was exclusive of:  Separately billable procedures and treating other patients   Critical care was necessary to treat or prevent imminent or life-threatening deterioration of the following conditions:  Respiratory failure   Critical care was time spent personally by me on the following activities:  Development of treatment plan with patient or surrogate, discussions with consultants, evaluation of patient's response to treatment, examination of patient, obtaining history from patient or surrogate, ordering and  performing treatments and interventions, ordering and review of laboratory studies, ordering and review of radiographic studies, pulse oximetry, re-evaluation of patient's condition and review of old charts     ____________________________________________   [place] / MDM / [place] / ED COURSE  As part of my medical decision making, I reviewed the following data within the [place] notes reviewed and incorporated, Labs reviewed , Old chart reviewed, Radiograph reviewed , Discussed with admitting physician (Dr. Marcille Blanco) and Notes from prior ED visits   Differential diagnosis includes, but is not limited to, asthma exacerbation, COVID-19, pneumonia.  I reviewed the medical record and saw the patient's frequent visits for similar symptoms.  He denies any contact with COVID-19 patients and is having no other symptoms.  He is in a negative pressure room and we are observing COVID-19 precautions for now and I have sent a rapid swab but I anticipate that this will be the case of asthma exacerbation.  I am treating with 3 duo nebs, Solu-Medrol 125 mg IV, and checking basic lab work, holding off on chest x-ray given his frequent visits and young age and lack of infectious symptoms.  I will hold off on magnesium IV but if he is not feeling better after the initial treatments I will proceed with magnesium as well.      Clinical Course as of May 25 413  Fri May 26, 2019  0406 I reassessed the patient after he received all the following interventions: DuoNeb nebulizer treatments x3, Solu-Medrol 125 mg IV, magnesium sulfate 2 g IV, and another albuterol 5 mg nebulizer treatment.  He says that he feels better but is still breathing through pursed lips although he is more comfortable and able to recline back little bit more.  At this point I believe that he needs to be admitted for scheduled breathing treatments and additional steroids.  He agrees with this  assessment.  I have informed Dr. Marcille Blanco with the hospitalist service of the admission and he acknowledged that through [facility] secure chat.   [CF]  0413 Ordered another albuterol 5 mg nebulizer while patient awaits admission   [CF]  0413 Removing isolation precautions   [CF]    Clinical Course User Index [CF] Hinda Kehr, MD     ____________________________________________  FINAL CLINICAL IMPRESSION(S) / ED DIAGNOSES  Final diagnoses:  Severe persistent asthma with exacerbation     MEDICATIONS GIVEN DURING THIS VISIT:  Medications  albuterol (PROVENTIL) (2.5 MG/3ML) 0.083% nebulizer solution 5 mg (has no administration in time range)  ipratropium-albuterol (DUONEB) 0.5-2.5 (3) MG/3ML nebulizer solution 3 mL (3 mLs Nebulization Given 05/26/19 0140)  ipratropium-albuterol (DUONEB) 0.5-2.5 (3) MG/3ML nebulizer solution 3 mL (3 mLs Nebulization Given 05/26/19 0140)  ipratropium-albuterol (DUONEB) 0.5-2.5 (3) MG/3ML nebulizer solution 3 mL (3 mLs Nebulization Given 05/26/19 0140)  methylPREDNISolone sodium succinate (SOLU-MEDROL) 125 mg/2 mL injection 125 mg (  125 mg Intravenous Given 05/26/19 0141)  magnesium sulfate IVPB 2 g 50 mL (0 g Intravenous Stopped 05/26/19 0348)  albuterol (PROVENTIL) (2.5 MG/3ML) 0.083% nebulizer solution 5 mg (5 mg Nebulization Given 05/26/19 0217)     ED Discharge Orders    None      *Please note:  John Schwartz was evaluated in Emergency Department on 05/26/2019 for the symptoms described in the history of present illness. He was evaluated in the context of the global COVID-19 pandemic, which necessitated consideration that the patient might be at risk for infection with the SARS-CoV-2 virus that causes COVID-19. Institutional protocols and algorithms that pertain to the evaluation of patients at risk for COVID-19 are in a state of rapid change based on information released by regulatory bodies including the CDC and federal and state organizations. These  policies and algorithms were followed during the patient's care in the ED.  Some ED evaluations and interventions may be delayed as a result of limited staffing during the pandemic.*  Note:  This document was prepared using Dragon voice recognition software and may include unintentional dictation errors.   Loleta RoseForbach, Mathhew Buysse, MD 05/26/19 [phone]

## 2019-05-26 NOTE — ED Triage Notes (Signed)
Patient c/o asthma. Patient wheezing all fields. Patient with multiple inhaler and neb uses today with no relief.

## 2019-05-26 NOTE — ED Notes (Signed)
ED TO INPATIENT HANDOFF REPORT  ED Nurse Name and Phone #: Berline Lopesgracie [phone]  S Name/Age/Gender John Schwartz 23 y.o. male Room/Bed: ED11A/ED11A  Code Status   Code Status: Prior  Home/SNF/Other Home Patient oriented to: self, place, time and situation Is this baseline? Yes   Triage Complete: Triage complete  Chief Complaint Asthma attack  Triage Note Patient c/o asthma. Patient wheezing all fields. Patient with multiple inhaler and neb uses today with no relief.    Allergies No Known Allergies  Level of Care/Admitting Diagnosis ED Disposition    ED Disposition Condition Comment   Admit  [facility] [[id number]]  Level of Care: Med-Surg [16]  Covid Evaluation: Confirmed COVID Negative  Diagnosis: Severe persistent asthma [454098][[id number]]  Admitting Physician: Arnaldo NatalDIAMOND, MICHAEL S [[id number]]  Attending Physician: Arnaldo NatalIAMOND, MICHAEL S [phone][[id number]]  PT Class (Do Not Modify): Observation [104]  PT Acc Code (Do Not Modify): Observation [10022]       B Medical/Surgery History Past Medical History:  Diagnosis Date  . Asthma   . Bronchospasm    Past Surgical History:  Procedure Laterality Date  . NO PAST SURGERIES       A IV Location/Drains/Wounds Patient Lines/Drains/Airways Status   Active Line/Drains/Airways    Name:   Placement date:   Placement time:   Site:   Days:   Peripheral IV 05/26/19 Right Antecubital   05/26/19    0141    Antecubital   less than 1          Intake/Output Last 24 hours  Intake/Output Summary (Last 24 hours) at 05/26/2019 [id number] Last data filed at 05/26/2019 0348 Gross per 24 hour  Intake 75.83 ml  Output -  Net 75.83 ml    Labs/Imaging Results for orders placed or performed during the hospital encounter of 05/26/19 (from the past 48 hour(s))  SARS Coronavirus 2 (CEPHEID- Performed in [facility] hospital lab), Hosp Order     Status: None   Collection Time: 05/26/19  1:36 AM   Specimen: Nasopharyngeal Swab   Result Value Ref Range   SARS Coronavirus 2 NEGATIVE NEGATIVE    Comment: (NOTE) If result is NEGATIVE SARS-CoV-2 target nucleic acids are NOT DETECTED. The SARS-CoV-2 RNA is generally detectable in upper and lower  respiratory specimens during the acute phase of infection. The lowest  concentration of SARS-CoV-2 viral copies this assay can detect is 250  copies / mL. A negative result does not preclude SARS-CoV-2 infection  and should not be used as the sole basis for treatment or other  patient management decisions.  A negative result may occur with  improper specimen collection / handling, submission of specimen other  than nasopharyngeal swab, presence of viral mutation(s) within the  areas targeted by this assay, and inadequate number of viral copies  (<250 copies / mL). A negative result must be combined with clinical  observations, patient history, and epidemiological information. If result is POSITIVE SARS-CoV-2 target nucleic acids are DETECTED. The SARS-CoV-2 RNA is generally detectable in upper and lower  respiratory specimens dur ing the acute phase of infection.  Positive  results are indicative of active infection with SARS-CoV-2.  Clinical  correlation with patient history and other diagnostic information is  necessary to determine patient infection status.  Positive results do  not rule out bacterial infection or co-infection with other viruses. If result is PRESUMPTIVE POSTIVE SARS-CoV-2 nucleic acids MAY BE PRESENT.   A presumptive positive result was obtained on the submitted specimen  and confirmed on repeat testing.  While 2019 novel coronavirus  (SARS-CoV-2) nucleic acids may be present in the submitted sample  additional confirmatory testing may be necessary for epidemiological  and / or clinical management purposes  to differentiate between  SARS-CoV-2 and other Sarbecovirus currently known to infect humans.  If clinically indicated additional testing with  an alternate test  methodology [id number]) is advised. The SARS-CoV-2 RNA is generally  detectable in upper and lower respiratory sp ecimens during the acute  phase of infection. The expected result is Negative. Fact Sheet for Patients:  StrictlyIdeas.no Fact Sheet for Healthcare Providers: BankingDealers.co.za This test is not yet approved or cleared by the [place] FDA and has been authorized for detection and/or diagnosis of SARS-CoV-2 by FDA under an Emergency Use Authorization (EUA).  This EUA will remain in effect (meaning this test can be used) for the duration of the COVID-19 declaration under Section 564(b)(1) of the Act, 21 U.S.C. section 360bbb-3(b)(1), unless the authorization is terminated or revoked sooner. Performed at [facility], [address] [place], [place] [address]   CBC with Differential/Platelet     Status: Abnormal   Collection Time: 05/26/19  1:36 AM  Result Value Ref Range   WBC 10.5 4.0 - 10.5 K/uL   RBC 5.55 4.22 - 5.81 MIL/uL   Hemoglobin 15.2 13.0 - 17.0 g/dL   HCT 45.8 39.0 - 52.0 %   MCV 82.5 80.0 - 100.0 fL   MCH 27.4 26.0 - 34.0 pg   MCHC 33.2 30.0 - 36.0 g/dL   RDW 12.2 11.5 - 15.5 %   Platelets 296 150 - 400 K/uL   nRBC 0.0 0.0 - 0.2 %   Neutrophils Relative % 46 %   Neutro Abs 4.8 1.7 - 7.7 K/uL   Lymphocytes Relative 26 %   Lymphs Abs 2.8 0.7 - 4.0 K/uL   Monocytes Relative 7 %   Monocytes Absolute 0.8 0.1 - 1.0 K/uL   Eosinophils Relative 20 %   Eosinophils Absolute 2.0 (H) 0.0 - 0.5 K/uL   Basophils Relative 1 %   Basophils Absolute 0.1 0.0 - 0.1 K/uL   Immature Granulocytes 0 %   Abs Immature Granulocytes 0.04 0.00 - 0.07 K/uL    Comment: Performed at [facility], [address] [place], [place] [address]  Comprehensive metabolic panel     Status: None   Collection Time: 05/26/19  1:36 AM  Result Value Ref Range   Sodium 139 135 - 145 mmol/L   Potassium  3.7 3.5 - 5.1 mmol/L   Chloride 108 98 - 111 mmol/L   CO2 24 22 - 32 mmol/L   Glucose, Bld 90 70 - 99 mg/dL   BUN 15 6 - 20 mg/dL   Creatinine, Ser 1.16 0.61 - 1.24 mg/dL   Calcium 8.9 8.9 - 10.3 mg/dL   Total Protein 6.9 6.5 - 8.1 g/dL   Albumin 4.4 3.5 - 5.0 g/dL   AST 25 15 - 41 U/L   ALT 41 0 - 44 U/L   Alkaline Phosphatase 51 38 - 126 U/L   Total Bilirubin 0.3 0.3 - 1.2 mg/dL   GFR calc non Af Amer >60 >60 mL/min   GFR calc Af Amer >60 >60 mL/min   Anion gap 7 5 - 15    Comment: Performed at [facility], [address] [place], [place] [address]  Magnesium     Status: None   Collection Time: 05/26/19  1:36 AM  Result Value Ref Range   Magnesium 2.0 1.7 - 2.4 mg/dL    Comment: Performed at [facility], [address] [place], [place] [address]   Dg Chest Portable 1 View  Result Date: 05/26/2019 CLINICAL DATA:  Shortness of breath EXAM: PORTABLE CHEST 1 VIEW COMPARISON:  April 30, 2019 FINDINGS: The lungs are somewhat hyperexpanded. The heart size is normal. There is no pneumothorax. No large pleural effusion. There is no acute osseous abnormality. IMPRESSION: Hyperexpanded lungs, otherwise no acute cardiopulmonary process. Electronically Signed   By: Katherine Mantlehristopher  Green M.D.   On: 05/26/2019 03:28    Pending Labs Unresulted Labs (From admission, onward)    Start     Ordered   Signed and Held  Creatinine, serum  (enoxaparin (LOVENOX)    CrCl >/= 30 ml/min)  Weekly,   R    Comments: while on enoxaparin therapy    Signed and Held   Signed and Held  TSH  Add-on,   R     Signed and Held          Vitals/Pain Today's Vitals   05/26/19 0215 05/26/19 0216 05/26/19 0217 05/26/19 0230  BP:   (!) 138/98 (!) 141/88  Pulse: (!) 115 (!) 116 (!) 112 (!) 114  Resp:      Temp:      SpO2: 95% 98% 94% 99%  Weight:      PainSc:        Isolation Precautions No active isolations  Medications Medications  ipratropium-albuterol (DUONEB) 0.5-2.5 (3) MG/3ML  nebulizer solution 3 mL (3 mLs Nebulization Given 05/26/19 0140)  ipratropium-albuterol (DUONEB) 0.5-2.5 (3) MG/3ML nebulizer solution 3 mL (3 mLs Nebulization Given 05/26/19 0140)  ipratropium-albuterol (DUONEB) 0.5-2.5 (3) MG/3ML nebulizer solution 3 mL (3 mLs Nebulization Given 05/26/19 0140)  methylPREDNISolone sodium succinate (SOLU-MEDROL) 125 mg/2 mL injection 125 mg (125 mg Intravenous Given 05/26/19 0141)  magnesium sulfate IVPB 2 g 50 mL (0 g Intravenous Stopped 05/26/19 0348)  albuterol (PROVENTIL) (2.5 MG/3ML) 0.083% nebulizer solution 5 mg (5 mg Nebulization Given 05/26/19 0217)  albuterol (PROVENTIL) (2.5 MG/3ML) 0.083% nebulizer solution 5 mg (5 mg Nebulization Given 05/26/19 0430)    Mobility walks Low fall risk   Focused Assessments pulmonary   R Recommendations: See Admitting Provider Note  Report given to:   Additional Notes:

## 2019-05-26 NOTE — ED Notes (Signed)
Attempted to call report at this time 

## 2019-05-27 MED ORDER — ACETAMINOPHEN 325 MG PO TABS: 650.0000 mg | ORAL_TABLET | Freq: Four times a day (QID) | ORAL | 0 refills | Status: AC | PRN

## 2019-05-27 MED ORDER — IBUPROFEN 400 MG PO TABS
400.0000 mg | ORAL_TABLET | Freq: Four times a day (QID) | ORAL | Status: DC | PRN
  Administered 2019-05-27: 400 mg via ORAL
  Filled 2019-05-27: qty 1

## 2019-05-27 MED ORDER — IPRATROPIUM-ALBUTEROL 0.5-2.5 (3) MG/3ML IN SOLN: 3.0000 mL | RESPIRATORY_TRACT | 0 refills | Status: DC | PRN

## 2019-05-27 MED ORDER — PREDNISONE 10 MG (21) PO TBPK: ORAL_TABLET | ORAL | 0 refills | Status: DC

## 2019-05-27 NOTE — Discharge Summary (Signed)
[facility] - [place] at [facility]   PATIENT NAME: John Schwartz    MR#:  [id number]  DATE OF BIRTH:  06-21-1996  DATE OF ADMISSION:  05/26/2019 ADMITTING PHYSICIAN: Arnaldo NatalMichael S Diamond, MD  DATE OF DISCHARGE: 05/27/2019   PRIMARY CARE PHYSICIAN: Patient, No Pcp Per    ADMISSION DIAGNOSIS:  Severe persistent asthma with exacerbation [J45.51]  DISCHARGE DIAGNOSIS:  Active Problems:   Severe persistent asthma   SECONDARY DIAGNOSIS:   Past Medical History:  Diagnosis Date  . Asthma   . Bronchospasm     HOSPITAL COURSE:   1. Asthma: Severe, persistent; with exacerbation. Continue steroids as well as DuoNeb treatments.   on an inhaled corticosteroid as well as long-acting bronchial agonist. Address triggers and reinforced asthma action plan. Feels much better now- discharge on oral tapering steroids. He have inhalers and nebulizers at home.  2. DVT prophylaxis: Lovenox 3. GI prophylaxis: None 4.  Tachycardia-likely due to respiratory issues, will give oral Cardizem to help.   DISCHARGE CONDITIONS:   Stable.  CONSULTS OBTAINED:    DRUG ALLERGIES:  No Known Allergies  DISCHARGE MEDICATIONS:   Allergies as of 05/27/2019   No Known Allergies     Medication List    TAKE these medications   acetaminophen 325 MG tablet Commonly known as: TYLENOL Take 2 tablets (650 mg total) by mouth every 6 (six) hours as needed for mild pain (or Fever >/= 101).   albuterol 108 (90 Base) MCG/ACT inhaler Commonly known as: VENTOLIN HFA Inhale 2 puffs into the lungs every 6 (six) hours as needed for wheezing or shortness of breath.   albuterol (2.5 MG/3ML) 0.083% nebulizer solution Commonly known as: PROVENTIL Take 3 mLs (2.5 mg total) by nebulization every 6 (six) hours as needed for wheezing or shortness of breath.   Fluticasone-Salmeterol 100-50 MCG/DOSE Aepb Commonly known as: Advair Diskus Inhale 1 puff into the lungs 2 (two) times daily.    ipratropium-albuterol 0.5-2.5 (3) MG/3ML Soln Commonly known as: DUONEB Take 3 mLs by nebulization every 4 (four) hours as needed.   predniSONE 10 MG (21) Tbpk tablet Commonly known as: STERAPRED UNI-PAK 21 TAB Take 6 tabs first day, 5 tab on day 2, then 4 on day 3rd, 3 tabs on day 4th , 2 tab on day 5th, and 1 tab on 6th day.   Pulmicort Flexhaler 90 MCG/ACT inhaler Generic drug: Budesonide Inhale 2 puffs into the lungs 2 (two) times daily.        DISCHARGE INSTRUCTIONS:    Follow with PMD in 1-2 weeks  If you experience worsening of your admission symptoms, develop shortness of breath, life threatening emergency, suicidal or homicidal thoughts you must seek medical attention immediately by calling 911 or calling your MD immediately  if symptoms less severe.  You Must read complete instructions/literature along with all the possible adverse reactions/side effects for all the Medicines you take and that have been prescribed to you. Take any new Medicines after you have completely understood and accept all the possible adverse reactions/side effects.   Please note  You were cared for by a hospitalist during your hospital stay. If you have any questions about your discharge medications or the care you received while you were in the hospital after you are discharged, you can call the unit and asked to speak with the hospitalist on call if the hospitalist that took care of you is not available. Once you are discharged, your primary care physician will handle any  further medical issues. Please note that NO REFILLS for any discharge medications will be authorized once you are discharged, as it is imperative that you return to your primary care physician (or establish a relationship with a primary care physician if you do not have one) for your aftercare needs so that they can reassess your need for medications and monitor your lab values.    Today   CHIEF COMPLAINT:   Chief Complaint   Patient presents with  . Asthma    HISTORY OF PRESENT ILLNESS:  John Schwartz  is a 23 y.o. male with a known history of asthma with multiple ED visits in the last few months presents to the emergency department with shortness of breath.  He has never been intubated.  The patient reports that he became acutely short of breath this afternoon.  Breathing treatments at home did not help.  He received Solu-Medrol 125 mg in the emergency department as well as magnesium and multiple breathing treatments.  He continues to wheeze and require intermittent supplemental oxygen which prompted the emergency department staff call hospitalist service for admission.   VITAL SIGNS:  Blood pressure (!) 108/54, pulse 87, temperature 98.1 F (36.7 C), temperature source Oral, resp. rate 18, height 5\' 11"  (1.803 m), weight 79.7 kg, SpO2 96 %.  I/O:    Intake/Output Summary (Last 24 hours) at 05/27/2019 1046 Last data filed at 05/27/2019 0435 Gross per 24 hour  Intake -  Output 500 ml  Net -500 ml    PHYSICAL EXAMINATION:  GENERAL:  23 y.o.-year-old patient lying in the bed with no acute distress.  EYES: Pupils equal, round, reactive to light and accommodation. No scleral icterus. Extraocular muscles intact.  HEENT: Head atraumatic, normocephalic. Oropharynx and nasopharynx clear.  NECK:  Supple, no jugular venous distention. No thyroid enlargement, no tenderness.  LUNGS: Normal breath sounds bilaterally, no wheezing, rales,rhonchi or crepitation. No use of accessory muscles of respiration.  CARDIOVASCULAR: S1, S2 normal. No murmurs, rubs, or gallops.  ABDOMEN: Soft, non-tender, non-distended. Bowel sounds present. No organomegaly or mass.  EXTREMITIES: No pedal edema, cyanosis, or clubbing.  NEUROLOGIC: Cranial nerves II through XII are intact. Muscle strength 5/5 in all extremities. Sensation intact. Gait not checked.  PSYCHIATRIC: The patient is alert and oriented x 3.  SKIN: No obvious rash, lesion,  or ulcer.   DATA REVIEW:   CBC Recent Labs  Lab 05/26/19 0136  WBC 10.5  HGB 15.2  HCT 45.8  PLT 296    Chemistries  Recent Labs  Lab 05/26/19 0136  NA 139  K 3.7  CL 108  CO2 24  GLUCOSE 90  BUN 15  CREATININE 1.16  CALCIUM 8.9  MG 2.0  AST 25  ALT 41  ALKPHOS 51  BILITOT 0.3    Cardiac Enzymes No results for input(s): TROPONINI in the last 168 hours.  Microbiology Results  Results for orders placed or performed during the hospital encounter of 05/26/19  SARS Coronavirus 2 (CEPHEID- Performed in [facility] hospital lab), Hosp Order     Status: None   Collection Time: 05/26/19  1:36 AM   Specimen: Nasopharyngeal Swab  Result Value Ref Range Status   SARS Coronavirus 2 NEGATIVE NEGATIVE Final    Comment: (NOTE) If result is NEGATIVE SARS-CoV-2 target nucleic acids are NOT DETECTED. The SARS-CoV-2 RNA is generally detectable in upper and lower  respiratory specimens during the acute phase of infection. The lowest  concentration of SARS-CoV-2 viral copies this assay can detect  is 250  copies / mL. A negative result does not preclude SARS-CoV-2 infection  and should not be used as the sole basis for treatment or other  patient management decisions.  A negative result may occur with  improper specimen collection / handling, submission of specimen other  than nasopharyngeal swab, presence of viral mutation(s) within the  areas targeted by this assay, and inadequate number of viral copies  (<250 copies / mL). A negative result must be combined with clinical  observations, patient history, and epidemiological information. If result is POSITIVE SARS-CoV-2 target nucleic acids are DETECTED. The SARS-CoV-2 RNA is generally detectable in upper and lower  respiratory specimens dur ing the acute phase of infection.  Positive  results are indicative of active infection with SARS-CoV-2.  Clinical  correlation with patient history and other diagnostic information is   necessary to determine patient infection status.  Positive results do  not rule out bacterial infection or co-infection with other viruses. If result is PRESUMPTIVE POSTIVE SARS-CoV-2 nucleic acids MAY BE PRESENT.   A presumptive positive result was obtained on the submitted specimen  and confirmed on repeat testing.  While 2019 novel coronavirus  (SARS-CoV-2) nucleic acids may be present in the submitted sample  additional confirmatory testing may be necessary for epidemiological  and / or clinical management purposes  to differentiate between  SARS-CoV-2 and other Sarbecovirus currently known to infect humans.  If clinically indicated additional testing with an alternate test  methodology [phone]([id number]) is advised. The SARS-CoV-2 RNA is generally  detectable in upper and lower respiratory sp ecimens during the acute  phase of infection. The expected result is Negative. Fact Sheet for Patients:  BoilerBrush.com.cyhttps://www.fda.gov/media/136312/download Fact Sheet for Healthcare Providers: https://pope.com/https://www.fda.gov/media/136313/download This test is not yet approved or cleared by the [place] FDA and has been authorized for detection and/or diagnosis of SARS-CoV-2 by FDA under an Emergency Use Authorization (EUA).  This EUA will remain in effect (meaning this test can be used) for the duration of the COVID-19 declaration under Section 564(b)(1) of the Act, 21 U.S.C. section 360bbb-3(b)(1), unless the authorization is terminated or revoked sooner. Performed at [facility], [address] [place], [place] [address]     RADIOLOGY:  Dg Chest Portable 1 View  Result Date: 05/26/2019 CLINICAL DATA:  Shortness of breath EXAM: PORTABLE CHEST 1 VIEW COMPARISON:  April 30, 2019 FINDINGS: The lungs are somewhat hyperexpanded. The heart size is normal. There is no pneumothorax. No large pleural effusion. There is no acute osseous abnormality. IMPRESSION: Hyperexpanded lungs, otherwise no acute  cardiopulmonary process. Electronically Signed   By: Katherine Mantlehristopher  Green M.D.   On: 05/26/2019 03:28    EKG:   Orders placed or performed during the hospital encounter of 04/30/19  . EKG 12-Lead  . EKG 12-Lead  . ED EKG  . ED EKG      Management plans discussed with the patient, family and they are in agreement.  CODE STATUS:     Code Status Orders  (From admission, onward)         Start     Ordered   05/26/19 0611  Full code  Continuous     05/26/19 0610        Code Status History    Date Active Date Inactive Code Status Order ID Comments User Context   11/17/2018 0129 11/18/2018 1649 Full Code [id number]  Montez MoritaPatel, Jitendra, MD Inpatient   03/04/2018 2104 03/06/2018 1315 Full Code [id number]  Enid BaasKalisetti, Radhika, MD Inpatient  11/01/2016 1502 11/02/2016 1656 Full Code [id number]  Demetrios Loll, MD ED   06/04/2016 0858 06/07/2016 2006 Full Code [id number]  Samella Parr, NP Inpatient   03/16/2016 0330 03/17/2016 1426 Full Code [id number]  Saundra Shelling, MD Inpatient   02/11/2014 1907 02/14/2014 2110 Full Code [id number]  Deno Lunger, MD ED   Advance Care Planning Activity      TOTAL TIME TAKING CARE OF THIS PATIENT: 35 minutes.    Vaughan Basta M.D on 05/27/2019 at 10:46 AM  Between 7am to 6pm - Pager - [phone]  After 6pm go to [email or web address] - password [email or web address] [place] [facility]  Office  [phone]  CC: Primary care physician; Patient, No Pcp Per   Note: This dictation was prepared with Dragon dictation along with smaller phrase technology. Any transcriptional errors that result from this process are unintentional.

## 2019-07-11 ENCOUNTER — Emergency Department
Admission: EM | Admit: 2019-07-11 | Discharge: 2019-07-11 | Disposition: A | Discharge status: Home / Self Care | Payer: Medicaid Other | Attending: Student | Admitting: Student

## 2019-07-11 ENCOUNTER — Encounter: Payer: Self-pay | Admitting: Emergency Medicine

## 2019-07-11 ENCOUNTER — Other Ambulatory Visit: Payer: Self-pay

## 2019-07-11 ENCOUNTER — Emergency Department: Payer: Medicaid Other

## 2019-07-11 DIAGNOSIS — Z20828 Contact with and (suspected) exposure to other viral communicable diseases: Secondary | ICD-10-CM | POA: Diagnosis not present

## 2019-07-11 DIAGNOSIS — R062 Wheezing: Secondary | ICD-10-CM | POA: Diagnosis present

## 2019-07-11 DIAGNOSIS — J4521 Mild intermittent asthma with (acute) exacerbation: Secondary | ICD-10-CM | POA: Diagnosis not present

## 2019-07-11 LAB — CBC WITH DIFFERENTIAL/PLATELET
Abs Immature Granulocytes: 0.02 10*3/uL (ref 0.00–0.07)
Basophils Absolute: 0.1 10*3/uL (ref 0.0–0.1)
Basophils Relative: 1 %
Eosinophils Absolute: 1.7 10*3/uL — ABNORMAL HIGH (ref 0.0–0.5)
Eosinophils Relative: 19 %
HCT: 44.7 % (ref 39.0–52.0)
Hemoglobin: 14.9 g/dL (ref 13.0–17.0)
Immature Granulocytes: 0 %
Lymphocytes Relative: 19 %
Lymphs Abs: 1.6 10*3/uL (ref 0.7–4.0)
MCH: 27.5 pg (ref 26.0–34.0)
MCHC: 33.3 g/dL (ref 30.0–36.0)
MCV: 82.5 fL (ref 80.0–100.0)
Monocytes Absolute: 0.6 10*3/uL (ref 0.1–1.0)
Monocytes Relative: 7 %
Neutro Abs: 4.7 10*3/uL (ref 1.7–7.7)
Neutrophils Relative %: 54 %
Platelets: 293 10*3/uL (ref 150–400)
RBC: 5.42 MIL/uL (ref 4.22–5.81)
RDW: 12.1 % (ref 11.5–15.5)
WBC: 8.6 10*3/uL (ref 4.0–10.5)
nRBC: 0 % (ref 0.0–0.2)

## 2019-07-11 LAB — BASIC METABOLIC PANEL
Anion gap: 7 (ref 5–15)
BUN: 11 mg/dL (ref 6–20)
CO2: 25 mmol/L (ref 22–32)
Calcium: 9.6 mg/dL (ref 8.9–10.3)
Chloride: 109 mmol/L (ref 98–111)
Creatinine, Ser: 1.39 mg/dL — ABNORMAL HIGH (ref 0.61–1.24)
GFR calc Af Amer: >60 mL/min (ref >60)
GFR calc non Af Amer: >60 mL/min (ref >60)
Glucose, Bld: 94 mg/dL (ref 70–99)
Potassium: 4.5 mmol/L (ref 3.5–5.1)
Sodium: 141 mmol/L (ref 135–145)

## 2019-07-11 LAB — MAGNESIUM: Magnesium: 2 mg/dL (ref 1.7–2.4)

## 2019-07-11 MED ORDER — ALBUTEROL SULFATE (2.5 MG/3ML) 0.083% IN NEBU
2.5000 mg | INHALATION_SOLUTION | Freq: Once | RESPIRATORY_TRACT | Status: AC
  Administered 2019-07-11: 2.5 mg via RESPIRATORY_TRACT
  Filled 2019-07-11: qty 3

## 2019-07-11 MED ORDER — METHYLPREDNISOLONE SODIUM SUCC 125 MG IJ SOLR
125.0000 mg | Freq: Once | INTRAMUSCULAR | Status: AC
  Administered 2019-07-11: 13:00:00 125 mg via INTRAVENOUS
  Filled 2019-07-11: qty 2

## 2019-07-11 MED ORDER — PREDNISONE 20 MG PO TABS: 40.0000 mg | ORAL_TABLET | Freq: Every day | ORAL | 0 refills | Status: AC

## 2019-07-11 MED ORDER — IPRATROPIUM-ALBUTEROL 0.5-2.5 (3) MG/3ML IN SOLN
3.0000 mL | Freq: Once | RESPIRATORY_TRACT | Status: AC
  Administered 2019-07-11: 13:00:00 3 mL via RESPIRATORY_TRACT
  Filled 2019-07-11: qty 3

## 2019-07-11 MED ORDER — ALBUTEROL SULFATE (2.5 MG/3ML) 0.083% IN NEBU
INHALATION_SOLUTION | RESPIRATORY_TRACT | Status: AC
  Filled 2019-07-11: qty 3

## 2019-07-11 MED ORDER — FLUTICASONE-SALMETEROL 100-50 MCG/DOSE IN AEPB: 1.0000 | INHALATION_SPRAY | Freq: Two times a day (BID) | RESPIRATORY_TRACT | 0 refills | Status: DC

## 2019-07-11 MED ORDER — ALBUTEROL SULFATE (2.5 MG/3ML) 0.083% IN NEBU
2.5000 mg | INHALATION_SOLUTION | Freq: Once | RESPIRATORY_TRACT | Status: AC
  Administered 2019-07-11: 13:00:00 2.5 mg via RESPIRATORY_TRACT
  Filled 2019-07-11: qty 3

## 2019-07-11 MED ORDER — IPRATROPIUM-ALBUTEROL 0.5-2.5 (3) MG/3ML IN SOLN
RESPIRATORY_TRACT | Status: AC
  Filled 2019-07-11: qty 3

## 2019-07-11 MED ORDER — SODIUM CHLORIDE 0.9 % IV BOLUS
1000.0000 mL | Freq: Once | INTRAVENOUS | Status: AC
  Administered 2019-07-11: 13:00:00 1000 mL via INTRAVENOUS

## 2019-07-11 NOTE — ED Notes (Signed)
Pt trialing room air.

## 2019-07-11 NOTE — ED Provider Notes (Signed)
[facility] Emergency Department Provider Note  ____________________________________________   First MD Initiated Contact with Patient 07/11/19 1201     (approximate)  I have reviewed the triage vital signs and the nursing notes.  History  Chief Complaint Wheezing    HPI John Schwartz is a 23 y.o. male with history of asthma who presents to the emergency department for wheezing and chest tightness.  Symptoms have been going on for approximately 2 days.  He states this is typical of his asthma exacerbations.  His last exacerbation was in the middle of July, and required admission.  He states his symptoms today are less severe.  He denies any fevers, nausea, vomiting, abdominal pain, or sick contacts.  He has a minimally productive cough.  He last tried a nebulizer treatment prior to arrival, with no significant improvement.  He cannot identify any particular inciting event for this episodes's exacerbation, but does not that he recently lost his Advair daily preventative inhaler.         Past Medical Hx Past Medical History:  Diagnosis Date  . Asthma   . Bronchospasm     Problem List Patient Active Problem List   Diagnosis Date Noted  . Asthma with status asthmaticus 06/04/2016  . Acute hypokalemia 06/04/2016  . Noncompliance with medication treatment due to underuse of medication 06/04/2016  . Acute renal insufficiency 06/04/2016  . Acute asthma exacerbation 06/04/2016  . Dyspnea 03/16/2016  . Asthma exacerbation 03/16/2016  . Severe persistent asthma 03/12/2014  . Status asthmaticus 02/11/2014  . Acute respiratory failure ([place]) 02/11/2014    Past Surgical Hx Past Surgical History:  Procedure Laterality Date  . NO PAST SURGERIES      Medications Prior to Admission medications   Medication Sig Start Date End Date Taking? Authorizing Provider  acetaminophen (TYLENOL) 325 MG tablet Take 2 tablets (650 mg total) by mouth every 6 (six) hours  as needed for mild pain (or Fever >/= 101). 05/27/19   Vaughan Basta, MD  albuterol (PROVENTIL HFA;VENTOLIN HFA) 108 (90 Base) MCG/ACT inhaler Inhale 2 puffs into the lungs every 6 (six) hours as needed for wheezing or shortness of breath. 02/01/19   Gregor Hams, MD  albuterol (PROVENTIL) (2.5 MG/3ML) 0.083% nebulizer solution Take 3 mLs (2.5 mg total) by nebulization every 6 (six) hours as needed for wheezing or shortness of breath. 04/30/19   Delman Kitten, MD  Fluticasone-Salmeterol (ADVAIR DISKUS) 100-50 MCG/DOSE AEPB Inhale 1 puff into the lungs 2 (two) times daily. 04/30/19 04/29/20  Delman Kitten, MD  ipratropium-albuterol (DUONEB) 0.5-2.5 (3) MG/3ML SOLN Take 3 mLs by nebulization every 4 (four) hours as needed. 05/27/19   Vaughan Basta, MD  predniSONE (STERAPRED UNI-PAK 21 TAB) 10 MG (21) TBPK tablet Take 6 tabs first day, 5 tab on day 2, then 4 on day 3rd, 3 tabs on day 4th , 2 tab on day 5th, and 1 tab on 6th day. 05/27/19   Vaughan Basta, MD  PULMICORT FLEXHALER 90 MCG/ACT inhaler Inhale 2 puffs into the lungs 2 (two) times daily. 03/23/19   [provider]    Allergies Patient has no known allergies.  Family Hx Family History  Problem Relation Age of Onset  . Arthritis Paternal Grandmother     Social Hx Social History   Tobacco Use  . Smoking status: Never Smoker  . Smokeless tobacco: Never Used  Substance Use Topics  . Alcohol use: No  . Drug use: Not Currently    Types:  Marijuana    Comment: Hx of smoking marajauna, last used over a year ago     Review of Systems  Constitutional: Negative for fever. Negative for chills. Eyes: Negative for visual changes. ENT: Negative for sore throat. Cardiovascular: Negative for chest pain. Respiratory: + wheezing and chest tightness Gastrointestinal: Negative for abdominal pain. Negative for nausea. Negative for vomiting. Genitourinary: Negative for dysuria. Musculoskeletal: Negative for leg  swelling. Skin: Negative for rash. Neurological: Negative for for headaches.   Physical Exam  Vital Signs: ED Triage Vitals  Enc Vitals Group     BP 07/11/19 1137 127/90     Pulse Rate 07/11/19 1137 88     Resp 07/11/19 1137 18     Temp 07/11/19 1137 98.8 F (37.1 C)     Temp Source 07/11/19 1137 Oral     SpO2 07/11/19 1137 91 %     Weight 07/11/19 1141 175 lb 11.3 oz (79.7 kg)     Height --      Head Circumference --      Peak Flow --      Pain Score --      Pain Loc --      Pain Edu? --      Excl. in GC? --     Constitutional: Alert and oriented.  Eyes: Conjunctivae clear. Sclera anicteric. Head: Normocephalic. Atraumatic. Nose: No congestion. No rhinorrhea. Mouth/Throat: Mucous membranes are moist.  Neck: No stridor.   Cardiovascular: Normal rate, regular rhythm. No murmurs. Extremities well perfused. Respiratory: Normal respiratory effort.  Lungs CTAB. Gastrointestinal: Initial oxygen 91% on RA, improved on 2 L [place]. Decreased, but present air movement bilaterally. Wheezing throughout. John to speak in full sentences.  Musculoskeletal: No lower extremity edema. Neurologic:  Normal speech and language. No gross focal neurologic deficits are appreciated.  Skin: Skin is warm, dry and intact. No rash noted. Psychiatric: Mood and affect are appropriate for situation.  EKG  N/A   Radiology  XR: IMPRESSION: No active disease.     Procedures  Procedure(s) performed (including critical care):  .Critical Care Performed by: Miguel AschoffMonks,  L., MD Authorized by: Miguel AschoffMonks,  L., MD   Critical care provider statement:    Critical care time (minutes):  30   Critical care was time spent personally by me on the following activities:  Evaluation of patient's response to treatment, examination of patient, ordering and performing treatments and interventions, ordering and review of laboratory studies, ordering and review of radiographic studies, pulse oximetry,  re-evaluation of patient's condition, obtaining history from patient or surrogate and review of old charts     Initial Impression / Assessment and Plan / ED Course  23 y.o. male with history of asthma and bronchospasm who presents to the ED for wheezing, chest tightness, typical of his exacerbations x 2 days. Perhaps triggered by loosing his daily preventative inhaler.   On arrival, oxygen is 91% on room air, improved on 2 L nasal cannula.  Decreased, but present air movement bilaterally with diffuse wheezing.  Speaking in full sentences.  Will obtain basic labs, DuoNeb treatment x3, IV steroids and reassess.  Evaluated after DuoNeb x3.  He reports feeling markedly improved.  Still with some mild scattered wheezing, but improved air movement bilaterally.  Continues to speak in full sentences, in no respiratory distress.  Maintaining sats above 94% on room air.  Patient states he feels well enough to go home.  We will plan for discharge with a course of prednisone, will write for  his Advair as well, and advise scheduled DuoNeb treatments for the next 2 days, as well as close PCP follow-up.  Discussed strict return precautions.  Patient voices understanding and is comfortable to plan and discharge.   Final Clinical Impression(s) / ED Diagnosis  Final diagnoses:  Wheezing  Exacerbation of intermittent asthma, unspecified asthma severity       Note:  This document was prepared using Dragon voice recognition software and may include unintentional dictation errors.   Miguel Aschoff., MD 07/11/19 [id number]

## 2019-07-11 NOTE — ED Notes (Signed)
First nurse note: pt to front desk with c/o asthma for 2 days now. Does not appear SHOB, however, is congested. Has been tested for Covid a few weeks ago-results were negative. Sats 89% on RA, states he used inhaler at home with no relief.

## 2019-07-11 NOTE — ED Triage Notes (Signed)
C/O coughing and wheezing x 2 days.  States has history of asthma.  Using his inhalers at home, without relief.  AAOx3.  Skin warm and dry.  Respirations regular and non labored.  Voice clear and strong.  Lungs are diminished throughout all fields.

## 2019-07-11 NOTE — Discharge Instructions (Addendum)
Thank you for letting us take care of you in the emergency department today.   Please continue to take your regular, prescribed medications.   New medications we have prescribed:  - Refill of your Advair - Prednisone (steroids) - take as directed - Please pick up over the counter cetirizine (Zyrtec) to help with your sinuses  For the next two days, use your Duoneb scheduled every 4-6 hours.  Please follow up with: - Your primary care doctor to review your ER visit and follow up on your symptoms.   Please return to the ER for any new or worsening symptoms.

## 2019-07-12 LAB — NOVEL CORONAVIRUS, NAA (HOSP ORDER, SEND-OUT TO REF LAB; TAT 18-24 HRS): SARS-CoV-2, NAA: NOT DETECTED

## 2019-08-11 ENCOUNTER — Other Ambulatory Visit: Payer: Self-pay

## 2019-08-11 ENCOUNTER — Encounter: Payer: Self-pay | Admitting: Emergency Medicine

## 2019-08-11 ENCOUNTER — Ambulatory Visit
Admission: EM | Admit: 2019-08-11 | Discharge: 2019-08-11 | Disposition: A | Discharge status: Home / Self Care | Payer: Medicaid Other | Attending: Family Medicine | Admitting: Family Medicine

## 2019-08-11 DIAGNOSIS — R05 Cough: Secondary | ICD-10-CM | POA: Diagnosis not present

## 2019-08-11 DIAGNOSIS — R0602 Shortness of breath: Secondary | ICD-10-CM | POA: Insufficient documentation

## 2019-08-11 DIAGNOSIS — Z20828 Contact with and (suspected) exposure to other viral communicable diseases: Secondary | ICD-10-CM | POA: Insufficient documentation

## 2019-08-11 DIAGNOSIS — J4521 Mild intermittent asthma with (acute) exacerbation: Secondary | ICD-10-CM | POA: Diagnosis not present

## 2019-08-11 MED ORDER — METHYLPREDNISOLONE SODIUM SUCC 125 MG IJ SOLR
125.0000 mg | Freq: Once | INTRAMUSCULAR | Status: AC
  Administered 2019-08-11: 125 mg via INTRAMUSCULAR

## 2019-08-11 MED ORDER — PREDNISONE 10 MG PO TABS: ORAL_TABLET | ORAL | 0 refills | Status: DC

## 2019-08-11 MED ORDER — IPRATROPIUM-ALBUTEROL 0.5-2.5 (3) MG/3ML IN SOLN: 3.0000 mL | Freq: Four times a day (QID) | RESPIRATORY_TRACT | 0 refills | Status: DC | PRN

## 2019-08-11 NOTE — ED Provider Notes (Signed)
[facility] ____________________________________________  Time seen: Approximately 12:12 PM  I have reviewed the triage vital signs and the nursing notes.   HISTORY  Chief Complaint Cough and Shortness of Breath   HPI John Schwartz is a 23 y.o. male history of asthma with multiple asthmatic ER visits and 2 admissions this year presenting for evaluation of wheezing and chest tightness.  Is also having some coughing with the associated chest tightness. Denies other cough. Reports this started 2 days ago and has been gradually worsening.  Denies current shortness of breath, but reports he has been having shortness of breath and chest tightness consistent with his previous asthma exacerbations.  Occasional nasal congestion.  Denies sore throat, fevers, other chest pain, or changes in taste or smell.  Has been using his home Advair and albuterol as prescribed.  States further used 3 albuterol nebulizer treatments this morning, last just prior to arrival without resolution.  Patient reports this is consistent with his normal asthma exacerbations, however reports he does not feel as severe as he has been in the past, and stating he is trying to be proactive. States he feels that weather changes are contributing and feels he needs prednisone.   Patient does not have a primary provider.   Past Medical History:  Diagnosis Date  . Asthma   . Bronchospasm     Patient Active Problem List   Diagnosis Date Noted  . Asthma with status asthmaticus 06/04/2016  . Acute hypokalemia 06/04/2016  . Noncompliance with medication treatment due to underuse of medication 06/04/2016  . Acute renal insufficiency 06/04/2016  . Acute asthma exacerbation 06/04/2016  . Dyspnea 03/16/2016  . Asthma exacerbation 03/16/2016  . Severe persistent asthma 03/12/2014  . Status asthmaticus 02/11/2014  . Acute respiratory failure ([place]) 02/11/2014    Past Surgical History:  Procedure Laterality Date  .  NO PAST SURGERIES       No current facility-administered medications for this encounter.   Current Outpatient Medications:  .  albuterol (PROVENTIL HFA;VENTOLIN HFA) 108 (90 Base) MCG/ACT inhaler, Inhale 2 puffs into the lungs every 6 (six) hours as needed for wheezing or shortness of breath., Disp: 1 Inhaler, Rfl: 0 .  albuterol (PROVENTIL) (2.5 MG/3ML) 0.083% nebulizer solution, Take 3 mLs (2.5 mg total) by nebulization every 6 (six) hours as needed for wheezing or shortness of breath., Disp: 75 mL, Rfl: 1 .  Fluticasone-Salmeterol (ADVAIR DISKUS) 100-50 MCG/DOSE AEPB, Inhale 1 puff into the lungs 2 (two) times daily., Disp: 60 each, Rfl: 0 .  acetaminophen (TYLENOL) 325 MG tablet, Take 2 tablets (650 mg total) by mouth every 6 (six) hours as needed for mild pain (or Fever >/= 101)., Disp: 20 tablet, Rfl: 0 .  ipratropium-albuterol (DUONEB) 0.5-2.5 (3) MG/3ML SOLN, Take 3 mLs by nebulization every 6 (six) hours as needed (wheezing)., Disp: 90 mL, Rfl: 0 .  predniSONE (DELTASONE) 10 MG tablet, Start 60 mg po day one, then 50 mg po day two, taper by 10 mg daily until complete., Disp: 21 tablet, Rfl: 0 .  PULMICORT FLEXHALER 90 MCG/ACT inhaler, Inhale 2 puffs into the lungs 2 (two) times daily., Disp: , Rfl:   Allergies Patient has no known allergies.  Family History  Problem Relation Age of Onset  . Arthritis Paternal Grandmother     Social History Social History   Tobacco Use  . Smoking status: Never Smoker  . Smokeless tobacco: Never Used  Substance Use Topics  . Alcohol use: No  . Drug  use: Not Currently    Types: Marijuana    Comment: Hx of smoking marajauna, last used over a year ago    Review of Systems Constitutional: No fever ENT: No sore throat. Cardiovascular: As above.  Respiratory: As above.  Gastrointestinal: No abdominal pain.  No nausea, no vomiting.  No diarrhea.  Genitourinary: Negative for dysuria. Musculoskeletal: Negative for back pain. Skin: Negative  for rash.   ____________________________________________   PHYSICAL EXAM:  VITAL SIGNS: ED Triage Vitals  Enc Vitals Group     BP 08/11/19 1156 (!) 126/97     Pulse Rate 08/11/19 1156 98     Resp 08/11/19 1156 17     Temp 08/11/19 1156 99 F (37.2 C)     Temp Source 08/11/19 1156 Oral     SpO2 08/11/19 1156 94 %     Weight 08/11/19 1153 165 lb (74.8 kg)     Height 08/11/19 1153 5\' 10"  (1.778 m)     Head Circumference --      Peak Flow --      Pain Score 08/11/19 1153 8     Pain Loc --      Pain Edu? --      Excl. in GC? --    Ambulatory pulse ox 93 to 94%.  Constitutional: Alert and oriented. Well appearing and in no acute distress. Eyes: Conjunctivae are normal.  ENT      Head: Normocephalic and atraumatic.      Nose: Mild nasal congestion.      Mouth/Throat: Mucous membranes are moist.Oropharynx non-erythematous. Hematological/Lymphatic/Immunilogical: No cervical lymphadenopathy. Cardiovascular: Normal rate, regular rhythm. Grossly normal heart sounds.  Good peripheral circulation. Respiratory: Normal respiratory effort without tachypnea nor retractions.  Breath sounds throughout.  Diffuse mild inspiratory and expiratory wheezes.  Speaking calmly and in complete sentences. Gastrointestinal: Soft and nontender Musculoskeletal: No extremity edema. Neurologic:  Normal speech and language. Speech is normal. No gait instability.  Skin:  Skin is warm, dry and intact. No rash noted. Psychiatric: Mood and affect are normal. Speech and behavior are normal. Patient exhibits appropriate insight and judgment   ___________________________________________   LABS (all labs ordered are listed, but only abnormal results are displayed)  Labs Reviewed  NOVEL CORONAVIRUS, NAA (HOSP ORDER, SEND-OUT TO REF LAB; TAT 18-24 HRS)   ____________________________________________  EKG   PROCEDURES Procedures    INITIAL IMPRESSION / ASSESSMENT AND PLAN / ED COURSE  Pertinent labs &  imaging results that were available during my care of the patient were reviewed by me and considered in my medical decision making (see chart for details).  Patient with history of uncontrolled asthma, frequent exacerbations presenting for 2 days of chest tightness with wheezing consistent with his previous asthma exacerbations.  Patient stating he is trying to be proactive and address this sooner than he has in the past.  O2 sat 93 to 94%.  Patient does have nebulizer at home, requests refill of DuoNeb's, Rx given.  125 mg Solu-Medrol IM given once.  Will start on prednisone taper.  Very strict conversation had with patient regarding if he is not improving this afternoon proceed directly to the emergency room for further care.  Will defer x-ray at this time, patient agrees.  Patient agrees this plan.   Discussed follow up and return parameters including no resolution or any worsening concerns. Patient verbalized understanding and agreed to plan.   ____________________________________________   FINAL CLINICAL IMPRESSION(S) / ED DIAGNOSES  Final diagnoses:  Exacerbation of intermittent asthma,  unspecified asthma severity     ED Discharge Orders         Ordered    ipratropium-albuterol (DUONEB) 0.5-2.5 (3) MG/3ML SOLN  Every 6 hours PRN     08/11/19 1224    predniSONE (DELTASONE) 10 MG tablet     08/11/19 1224           Note: This dictation was prepared with Dragon dictation along with smaller phrase technology. Any transcriptional errors that result from this process are unintentional.      .   Renford Dills, NP 08/11/19 1250

## 2019-08-11 NOTE — ED Triage Notes (Signed)
Patient c/o cough and SOB that started 2 days ago.  Patient denies fevers.  Patient states he has asthma.

## 2019-08-11 NOTE — Discharge Instructions (Addendum)
Take medication as prescribed. Rest. Drink plenty of fluids.  Continue home albuterol and nebulizer treatments as needed.  Please establish a primary care for regular follow-up and management.  This is important.  As discussed, if you are not improving this afternoon, or if you have any any increase shortness of breath, wheezing or worsening complaints proceed directly to the emergency room, calling 911 if needed.

## 2019-08-12 LAB — NOVEL CORONAVIRUS, NAA (HOSP ORDER, SEND-OUT TO REF LAB; TAT 18-24 HRS): SARS-CoV-2, NAA: NOT DETECTED

## 2019-08-14 ENCOUNTER — Encounter (HOSPITAL_COMMUNITY): Payer: Self-pay

## 2019-09-07 ENCOUNTER — Other Ambulatory Visit: Payer: Self-pay

## 2019-09-07 ENCOUNTER — Ambulatory Visit: Payer: Medicaid Other | Admitting: Physician Assistant

## 2019-09-07 DIAGNOSIS — Z113 Encounter for screening for infections with a predominantly sexual mode of transmission: Secondary | ICD-10-CM | POA: Diagnosis not present

## 2019-09-07 DIAGNOSIS — Z202 Contact with and (suspected) exposure to infections with a predominantly sexual mode of transmission: Secondary | ICD-10-CM

## 2019-09-07 MED ORDER — AZITHROMYCIN 500 MG PO TABS
1000.0000 mg | ORAL_TABLET | Freq: Once | ORAL | Status: AC
  Administered 2019-09-07: 1000 mg via ORAL

## 2019-09-07 MED ORDER — CEFTRIAXONE SODIUM 250 MG IJ SOLR
250.0000 mg | Freq: Once | INTRAMUSCULAR | Status: AC
  Administered 2019-09-07: 16:00:00 250 mg via INTRAMUSCULAR

## 2019-09-08 ENCOUNTER — Encounter: Payer: Self-pay | Admitting: Physician Assistant

## 2019-09-08 NOTE — Progress Notes (Signed)
    STI clinic/screening visit  Subjective:  John Schwartz is a 23 y.o. male being seen today for an STI screening visit. The patient reports they do not have symptoms.  Patient has the following medical conditions:   Patient Active Problem List   Diagnosis Date Noted  . Asthma with status asthmaticus 06/04/2016  . Acute hypokalemia 06/04/2016  . Noncompliance with medication treatment due to underuse of medication 06/04/2016  . Acute renal insufficiency 06/04/2016  . Acute asthma exacerbation 06/04/2016  . Dyspnea 03/16/2016  . Asthma exacerbation 03/16/2016  . Severe persistent asthma 03/12/2014  . Status asthmaticus 02/11/2014  . Acute respiratory failure ([place]) 02/11/2014     Chief Complaint  Patient presents with  . SEXUALLY TRANSMITTED DISEASE    HPI  Patient reports that he is a contact to [facility].  Denies symptoms and declines screening exam and blood work today.  Requests treatment as a contact only.  See flowsheet for further details and programmatic requirements.    The following portions of the patient's history were reviewed and updated as appropriate: allergies, current medications, past medical history, past social history, past surgical history and problem list.  Objective:  There were no vitals filed for this visit.  Physical Exam Constitutional:      General: He is not in acute distress.    Appearance: Normal appearance.  HENT:     Head: Atraumatic.  Pulmonary:     Effort: Pulmonary effort is normal.  Neurological:     Mental Status: He is alert and oriented to person, place, and time.  Psychiatric:        Mood and Affect: Mood normal.        Behavior: Behavior normal.        Thought Content: Thought content normal.        Judgment: Judgment normal.       Assessment and Plan:  John Schwartz is a 23 y.o. male presenting to the [facility] Department for STI screening  1. Screening for STD (sexually transmitted disease) Patient  into clinic without symptoms and declines screening exam and blood work today. Rec condoms with all sex.  2. Gonorrhea contact Will treat as a contact to [facility] with Azithromycin 1 g po DOT and Ceftriaxone 250 mg IM today. No sex for 7 days and until after partner completes treatment. RTC if vomits < 2 hr after taking medicine for re-treatment. - azithromycin (ZITHROMAX) tablet 1,000 mg - cefTRIAXone (ROCEPHIN) injection 250 mg     No follow-ups on file.  No future appointments.  Jerene Dilling, PA

## 2020-05-13 ENCOUNTER — Emergency Department: Payer: Medicaid Other

## 2020-05-13 ENCOUNTER — Inpatient Hospital Stay
Admission: EM | Admit: 2020-05-13 | Discharge: 2020-05-15 | DRG: 203 | Disposition: A | Discharge status: Home / Self Care | Payer: Medicaid Other | Attending: Internal Medicine | Admitting: Internal Medicine

## 2020-05-13 ENCOUNTER — Encounter: Payer: Self-pay | Admitting: Radiology

## 2020-05-13 DIAGNOSIS — Z20822 Contact with and (suspected) exposure to covid-19: Secondary | ICD-10-CM | POA: Diagnosis present

## 2020-05-13 DIAGNOSIS — J4521 Mild intermittent asthma with (acute) exacerbation: Secondary | ICD-10-CM

## 2020-05-13 DIAGNOSIS — J45901 Unspecified asthma with (acute) exacerbation: Secondary | ICD-10-CM | POA: Diagnosis present

## 2020-05-13 DIAGNOSIS — Z7951 Long term (current) use of inhaled steroids: Secondary | ICD-10-CM

## 2020-05-13 DIAGNOSIS — Z7952 Long term (current) use of systemic steroids: Secondary | ICD-10-CM

## 2020-05-13 DIAGNOSIS — J4551 Severe persistent asthma with (acute) exacerbation: Principal | ICD-10-CM | POA: Diagnosis present

## 2020-05-13 LAB — COMPREHENSIVE METABOLIC PANEL
ALT: 35 U/L (ref 0–44)
AST: 20 U/L (ref 15–41)
Albumin: 4.3 g/dL (ref 3.5–5.0)
Alkaline Phosphatase: 55 U/L (ref 38–126)
Anion gap: 9 (ref 5–15)
BUN: 11 mg/dL (ref 6–20)
CO2: 25 mmol/L (ref 22–32)
Calcium: 9 mg/dL (ref 8.9–10.3)
Chloride: 104 mmol/L (ref 98–111)
Creatinine, Ser: 1.26 mg/dL — ABNORMAL HIGH (ref 0.61–1.24)
GFR calc Af Amer: >60 mL/min (ref >60)
GFR calc non Af Amer: >60 mL/min (ref >60)
Glucose, Bld: 138 mg/dL — ABNORMAL HIGH (ref 70–99)
Potassium: 3.6 mmol/L (ref 3.5–5.1)
Sodium: 138 mmol/L (ref 135–145)
Total Bilirubin: 0.7 mg/dL (ref 0.3–1.2)
Total Protein: 7.1 g/dL (ref 6.5–8.1)

## 2020-05-13 LAB — CBC WITH DIFFERENTIAL/PLATELET
Abs Immature Granulocytes: 0.07 10*3/uL (ref 0.00–0.07)
Basophils Absolute: 0.1 10*3/uL (ref 0.0–0.1)
Basophils Relative: 1 %
Eosinophils Absolute: 1.6 10*3/uL — ABNORMAL HIGH (ref 0.0–0.5)
Eosinophils Relative: 13 %
HCT: 44.2 % (ref 39.0–52.0)
Hemoglobin: 15 g/dL (ref 13.0–17.0)
Immature Granulocytes: 1 %
Lymphocytes Relative: 18 %
Lymphs Abs: 2.2 10*3/uL (ref 0.7–4.0)
MCH: 27.3 pg (ref 26.0–34.0)
MCHC: 33.9 g/dL (ref 30.0–36.0)
MCV: 80.5 fL (ref 80.0–100.0)
Monocytes Absolute: 0.6 10*3/uL (ref 0.1–1.0)
Monocytes Relative: 5 %
Neutro Abs: 7.4 10*3/uL (ref 1.7–7.7)
Neutrophils Relative %: 62 %
Platelets: 259 10*3/uL (ref 150–400)
RBC: 5.49 MIL/uL (ref 4.22–5.81)
RDW: 12.2 % (ref 11.5–15.5)
WBC: 11.8 10*3/uL — ABNORMAL HIGH (ref 4.0–10.5)
nRBC: 0 % (ref 0.0–0.2)

## 2020-05-13 LAB — SARS CORONAVIRUS 2 BY RT PCR (HOSPITAL ORDER, PERFORMED IN ~~LOC~~ HOSPITAL LAB): SARS Coronavirus 2: NEGATIVE

## 2020-05-13 MED ORDER — MAGNESIUM SULFATE 2 GM/50ML IV SOLN
2.0000 g | Freq: Once | INTRAVENOUS | Status: AC
  Administered 2020-05-13: 2 g via INTRAVENOUS
  Filled 2020-05-13: qty 50

## 2020-05-13 MED ORDER — IPRATROPIUM-ALBUTEROL 0.5-2.5 (3) MG/3ML IN SOLN
3.0000 mL | Freq: Once | RESPIRATORY_TRACT | Status: AC
  Administered 2020-05-13: 3 mL via RESPIRATORY_TRACT
  Filled 2020-05-13: qty 3

## 2020-05-13 MED ORDER — IPRATROPIUM-ALBUTEROL 0.5-2.5 (3) MG/3ML IN SOLN
5.0000 mL | Freq: Once | RESPIRATORY_TRACT | Status: DC
  Filled 2020-05-13: qty 3

## 2020-05-13 MED ORDER — IPRATROPIUM BROMIDE 0.02 % IN SOLN
0.5000 mg | Freq: Once | RESPIRATORY_TRACT | Status: AC
  Administered 2020-05-13: 0.5 mg via RESPIRATORY_TRACT
  Filled 2020-05-13: qty 2.5

## 2020-05-13 MED ORDER — METHYLPREDNISOLONE SODIUM SUCC 125 MG IJ SOLR
125.0000 mg | Freq: Once | INTRAMUSCULAR | Status: AC
  Administered 2020-05-13: 125 mg via INTRAVENOUS
  Filled 2020-05-13: qty 2

## 2020-05-13 MED ORDER — IPRATROPIUM BROMIDE 0.02 % IN SOLN
0.5000 mg | Freq: Once | RESPIRATORY_TRACT | Status: DC
  Filled 2020-05-13: qty 2.5

## 2020-05-13 MED ORDER — ALBUTEROL SULFATE (2.5 MG/3ML) 0.083% IN NEBU
5.0000 mg | INHALATION_SOLUTION | Freq: Once | RESPIRATORY_TRACT | Status: AC
  Administered 2020-05-13: 5 mg via RESPIRATORY_TRACT
  Filled 2020-05-13: qty 6

## 2020-05-13 NOTE — ED Provider Notes (Signed)
Emergency Department Provider Note  ____________________________________________  Time seen: Approximately 10:57 PM  I have reviewed the triage vital signs and the nursing notes.   HISTORY  Chief Complaint Asthma   Historian Patient     HPI John Schwartz is a 24 y.o. male presents to the emergency department with shortness of breath and wheezing.  Patient has a history of uncontrolled asthma.  He states that he has been using his inhalers at home with no relief.  He is also started taking prednisone at home which has not helped his symptoms.  Patient has difficulty talking in complete sentences due to shortness of breath and has experienced increased work of breathing at home.  No fever or chills.  No recent illness.   Past Medical History:  Diagnosis Date  . Asthma   . Bronchospasm      Immunizations up to date:  Yes.     Past Medical History:  Diagnosis Date  . Asthma   . Bronchospasm     Patient Active Problem List   Diagnosis Date Noted  . Asthma with status asthmaticus 06/04/2016  . Acute hypokalemia 06/04/2016  . Noncompliance with medication treatment due to underuse of medication 06/04/2016  . Acute renal insufficiency 06/04/2016  . Acute asthma exacerbation 06/04/2016  . Dyspnea 03/16/2016  . Asthma exacerbation 03/16/2016  . Severe persistent asthma 03/12/2014  . Status asthmaticus 02/11/2014  . Acute respiratory failure ([facility]) 02/11/2014    Past Surgical History:  Procedure Laterality Date  . NO PAST SURGERIES      Prior to Admission medications   Medication Sig Start Date End Date Taking? Authorizing Provider  acetaminophen (TYLENOL) 325 MG tablet Take 2 tablets (650 mg total) by mouth every 6 (six) hours as needed for mild pain (or Fever >/= 101). 05/27/19   Altamese Dilling, MD  albuterol (PROVENTIL HFA;VENTOLIN HFA) 108 (90 Base) MCG/ACT inhaler Inhale 2 puffs into the lungs every 6 (six) hours as needed for wheezing or shortness of  breath. 02/01/19   Darci Current, MD  albuterol (PROVENTIL) (2.5 MG/3ML) 0.083% nebulizer solution Take 3 mLs (2.5 mg total) by nebulization every 6 (six) hours as needed for wheezing or shortness of breath. 04/30/19   Sharyn Creamer, MD  Fluticasone-Salmeterol (ADVAIR DISKUS) 100-50 MCG/DOSE AEPB Inhale 1 puff into the lungs 2 (two) times daily. 07/11/19 08/11/19  Miguel Aschoff., MD  ipratropium-albuterol (DUONEB) 0.5-2.5 (3) MG/3ML SOLN Take 3 mLs by nebulization every 6 (six) hours as needed (wheezing). 08/11/19   Renford Dills, NP  predniSONE (DELTASONE) 10 MG tablet Start 60 mg po day one, then 50 mg po day two, taper by 10 mg daily until complete. 08/11/19   Renford Dills, NP  PULMICORT FLEXHALER 90 MCG/ACT inhaler Inhale 2 puffs into the lungs 2 (two) times daily. 03/23/19   [provider]    Allergies Patient has no known allergies.  Family History  Problem Relation Age of Onset  . Arthritis Paternal Grandmother     Social History Social History   Tobacco Use  . Smoking status: Never Smoker  . Smokeless tobacco: Never Used  Vaping Use  . Vaping Use: Never used  Substance Use Topics  . Alcohol use: No  . Drug use: Not Currently    Types: Marijuana    Comment: Hx of smoking marajauna, last used over a year ago     Review of Systems  Constitutional: No fever/chills Eyes:  No discharge ENT: No upper respiratory complaints. Respiratory: Patient has  SOB.  Gastrointestinal:   No nausea, no vomiting.  No diarrhea.  No constipation. Musculoskeletal: Negative for musculoskeletal pain. Skin: Negative for rash, abrasions, lacerations, ecchymosis.  ____________________________________________   PHYSICAL EXAM:  VITAL SIGNS: ED Triage Vitals [05/13/20 2142]  Enc Vitals Group     BP (!) 146/104     Pulse Rate (!) 107     Resp (!) 22     Temp      Temp src      SpO2 95 %     Weight      Height      Head Circumference      Peak Flow      Pain Score      Pain  Loc      Pain Edu?      Excl. in GC?      Constitutional: Alert and oriented.  Patient is diaphoretic and seems uncomfortable.  He cannot speak in complete sentences. Eyes: Conjunctivae are normal. PERRL. EOMI. Head: Atraumatic. ENT:      Nose: No congestion/rhinnorhea.      Mouth/Throat: Mucous membranes are moist.  Neck: No stridor.  No cervical spine tenderness to palpation. Cardiovascular: Normal rate, regular rhythm. Normal S1 and S2.  Good peripheral circulation. Respiratory: No use of accessory muscles for respiration.  Patient has diffuse expiratory wheezing auscultated bilaterally with diminished breath sounds in the lung bases. Gastrointestinal: Bowel sounds x 4 quadrants. Soft and nontender to palpation. No guarding or rigidity. No distention. Musculoskeletal: Full range of motion to all extremities. No obvious deformities noted Neurologic:  Normal for age. No gross focal neurologic deficits are appreciated.  Skin:  Skin is warm, dry and intact. No rash noted. Psychiatric: Mood and affect are normal for age. Speech and behavior are normal.   ____________________________________________   LABS (all labs ordered are listed, but only abnormal results are displayed)  Labs Reviewed  CBC WITH DIFFERENTIAL/PLATELET - Abnormal; Notable for the following components:      Result Value   WBC 11.8 (*)    Eosinophils Absolute 1.6 (*)    All other components within normal limits  COMPREHENSIVE METABOLIC PANEL - Abnormal; Notable for the following components:   Glucose, Bld 138 (*)    Creatinine, Ser 1.26 (*)    All other components within normal limits  SARS CORONAVIRUS 2 BY RT PCR (HOSPITAL ORDER, PERFORMED IN [facility])   ____________________________________________  EKG   ____________________________________________  RADIOLOGY Geraldo Pitter, personally viewed and evaluated these images (plain radiographs) as part of my medical decision making, as well  as reviewing the written report by the radiologist.    DG Chest 1 View  Result Date: 05/13/2020 CLINICAL DATA:  Initial evaluation for acute shortness of breath. EXAM: CHEST  1 VIEW COMPARISON:  Prior radiograph from 07/11/2019. FINDINGS: The cardiac and mediastinal silhouettes are stable in size and contour, and remain within normal limits. The lungs are normally inflated. No airspace consolidation, pleural effusion, or pulmonary edema. No pneumothorax. No acute osseous abnormality. IMPRESSION: No radiographic evidence for active cardiopulmonary disease. Electronically Signed   By: Rise Mu M.D.   On: 05/13/2020 23:13    ____________________________________________    PROCEDURES  Procedure(s) performed:     Procedures     Medications  albuterol (PROVENTIL) (2.5 MG/3ML) 0.083% nebulizer solution 5 mg (5 mg Nebulization Given 05/13/20 2215)  ipratropium (ATROVENT) nebulizer solution 0.5 mg (0.5 mg Nebulization Given 05/13/20 2215)  methylPREDNISolone sodium succinate (SOLU-MEDROL) 125 mg/2  mL injection 125 mg (125 mg Intravenous Given 05/13/20 2214)  magnesium sulfate IVPB 2 g 50 mL (0 g Intravenous Stopped 05/13/20 2236)  ipratropium-albuterol (DUONEB) 0.5-2.5 (3) MG/3ML nebulizer solution 3 mL (3 mLs Nebulization Given 05/13/20 2236)  albuterol (PROVENTIL) (2.5 MG/3ML) 0.083% nebulizer solution 5 mg (5 mg Nebulization Given 05/13/20 2236)  ipratropium (ATROVENT) nebulizer solution 0.5 mg (0.5 mg Nebulization Given 05/13/20 2311)  albuterol (PROVENTIL) (2.5 MG/3ML) 0.083% nebulizer solution 5 mg (5 mg Nebulization Given 05/13/20 2311)     ____________________________________________   INITIAL IMPRESSION / ASSESSMENT AND PLAN / ED COURSE  Pertinent labs & imaging results that were available during my care of the patient were reviewed by me and considered in my medical decision making (see chart for details).    Assessment and Plan:  Asthma Exacerbation:  24 year old  male presents to the emergency department with increased work of breathing, wheezing, chest tightness and cough that worsened today.  Patient was tachycardic and tachypneic, satting at 96% on room air at triage.  Patient had diffuse wheezing auscultated bilaterally with diminished breath sounds in the bases bilaterally.  Patient was unable to speak in complete sentences due to shortness of breath.  Patient was given 3 albuterol and Atrovent breathing treatments in the emergency department as well as IV Solu-Medrol and magnesium.  Symptoms did improve mildly.  Discussed the need for admission with patient and he agreed.  CBC and CMP were reassuring.  COVID-19 testing was negative.  No consolidations, opacities or infiltrates on chest x-ray.  Consulted hospitalist on-call, Dr. Damita Dunnings who accepted patient for admission.  ____________________________________________  FINAL CLINICAL IMPRESSION(S) / ED DIAGNOSES  Final diagnoses:  Exacerbation of intermittent asthma, unspecified asthma severity      NEW MEDICATIONS STARTED DURING THIS VISIT:  ED Discharge Orders    None          This chart was dictated using voice recognition software/Dragon. Despite best efforts to proofread, errors can occur which can change the meaning. Any change was purely unintentional.     Lannie Fields, PA-C 05/14/20 0012    Carrie Mew, MD 05/14/20 [phone]

## 2020-05-13 NOTE — ED Triage Notes (Signed)
Pt presents to ED with difficulty breathing. Pt family member reports he has a hx of asthma and has been using his inhaler and nebulizer for the past few hours with no relief. Pt had prednisone at home that he had started taking as well but has not noticed any improvement. Pt has slight increased work of breathing noted.

## 2020-05-14 ENCOUNTER — Encounter: Payer: Self-pay | Admitting: Internal Medicine

## 2020-05-14 ENCOUNTER — Other Ambulatory Visit: Payer: Self-pay

## 2020-05-14 DIAGNOSIS — J45901 Unspecified asthma with (acute) exacerbation: Secondary | ICD-10-CM | POA: Diagnosis present

## 2020-05-14 DIAGNOSIS — Z20822 Contact with and (suspected) exposure to covid-19: Secondary | ICD-10-CM | POA: Diagnosis present

## 2020-05-14 DIAGNOSIS — J4551 Severe persistent asthma with (acute) exacerbation: Secondary | ICD-10-CM | POA: Diagnosis not present

## 2020-05-14 DIAGNOSIS — Z7951 Long term (current) use of inhaled steroids: Secondary | ICD-10-CM | POA: Diagnosis not present

## 2020-05-14 DIAGNOSIS — Z7952 Long term (current) use of systemic steroids: Secondary | ICD-10-CM | POA: Diagnosis not present

## 2020-05-14 LAB — HIV ANTIBODY (ROUTINE TESTING W REFLEX): HIV Screen 4th Generation wRfx: NONREACTIVE

## 2020-05-14 MED ORDER — SODIUM CHLORIDE 0.9 % IV SOLN: INTRAVENOUS | Status: DC

## 2020-05-14 MED ORDER — ALBUTEROL SULFATE (2.5 MG/3ML) 0.083% IN NEBU
INHALATION_SOLUTION | RESPIRATORY_TRACT | Status: AC
  Filled 2020-05-14: qty 3

## 2020-05-14 MED ORDER — IPRATROPIUM-ALBUTEROL 0.5-2.5 (3) MG/3ML IN SOLN
RESPIRATORY_TRACT | Status: AC
  Filled 2020-05-14: qty 3

## 2020-05-14 MED ORDER — IPRATROPIUM-ALBUTEROL 0.5-2.5 (3) MG/3ML IN SOLN
3.0000 mL | Freq: Four times a day (QID) | RESPIRATORY_TRACT | Status: DC
  Administered 2020-05-14 – 2020-05-15 (×6): 3 mL via RESPIRATORY_TRACT
  Filled 2020-05-14 (×5): qty 3

## 2020-05-14 MED ORDER — ACETAMINOPHEN 650 MG RE SUPP: 650.0000 mg | Freq: Four times a day (QID) | RECTAL | Status: DC | PRN

## 2020-05-14 MED ORDER — METHYLPREDNISOLONE SODIUM SUCC 125 MG IJ SOLR
60.0000 mg | Freq: Four times a day (QID) | INTRAMUSCULAR | Status: AC
  Administered 2020-05-14 (×4): 60 mg via INTRAVENOUS
  Filled 2020-05-14 (×3): qty 2

## 2020-05-14 MED ORDER — METHYLPREDNISOLONE SODIUM SUCC 125 MG IJ SOLR
INTRAMUSCULAR | Status: AC
  Filled 2020-05-14: qty 2

## 2020-05-14 MED ORDER — ONDANSETRON HCL 4 MG PO TABS: 4.0000 mg | ORAL_TABLET | Freq: Four times a day (QID) | ORAL | Status: DC | PRN

## 2020-05-14 MED ORDER — ACETAMINOPHEN 325 MG PO TABS: 650.0000 mg | ORAL_TABLET | Freq: Four times a day (QID) | ORAL | Status: DC | PRN

## 2020-05-14 MED ORDER — ALBUTEROL SULFATE (2.5 MG/3ML) 0.083% IN NEBU: 2.5000 mg | INHALATION_SOLUTION | RESPIRATORY_TRACT | Status: DC | PRN

## 2020-05-14 MED ORDER — ENOXAPARIN SODIUM 40 MG/0.4ML ~~LOC~~ SOLN: 40.0000 mg | SUBCUTANEOUS | Status: DC

## 2020-05-14 MED ORDER — ALBUTEROL SULFATE (2.5 MG/3ML) 0.083% IN NEBU
2.5000 mg | INHALATION_SOLUTION | Freq: Once | RESPIRATORY_TRACT | Status: AC
  Administered 2020-05-14: 2.5 mg via RESPIRATORY_TRACT

## 2020-05-14 MED ORDER — FLUTICASONE FUROATE-VILANTEROL 200-25 MCG/INH IN AEPB
1.0000 | INHALATION_SPRAY | Freq: Every day | RESPIRATORY_TRACT | Status: DC
  Administered 2020-05-15: 1 via RESPIRATORY_TRACT
  Filled 2020-05-14 (×2): qty 28

## 2020-05-14 MED ORDER — PREDNISONE 20 MG PO TABS
40.0000 mg | ORAL_TABLET | Freq: Every day | ORAL | Status: DC
  Administered 2020-05-15: 40 mg via ORAL
  Filled 2020-05-14: qty 2

## 2020-05-14 MED ORDER — ONDANSETRON HCL 4 MG/2ML IJ SOLN: 4.0000 mg | Freq: Four times a day (QID) | INTRAMUSCULAR | Status: DC | PRN

## 2020-05-14 NOTE — ED Notes (Signed)
Pharmacy to send up inhaler 

## 2020-05-14 NOTE — Progress Notes (Addendum)
He feels a little better.  He still has some wheezing.  He said he has been taking prednisone 20 mg twice a day for about 1 and half years.  He used to be on 10 mg daily but that was not working because he was having frequent exacerbations.  He said he has been on prednisone for about 5 to 6 years.  He has not seen a pulmonologist for more than 2 years because he relocated to [place].  Physical exam is significant for bilateral expiratory wheezing.  Continue steroids and bronchodilators.  Possible discharge to home tomorrow if he continues to improve.  Recommended follow-up with pulmonologist [facility] clinic pulmonology) for evaluation for treatment with biologic agents.  Plan discussed with patient and significant other at the bedside.

## 2020-05-14 NOTE — ED Notes (Signed)
Family at bedside. 

## 2020-05-14 NOTE — ED Notes (Signed)
Pt states its easier to breath now at this time. O2 saturation while ambulating never dropped below 90% on RA.

## 2020-05-14 NOTE — Progress Notes (Signed)
OT Cancellation Note  Patient Details Name: John Schwartz MRN: [id number] DOB: 29-Sep-1996   Cancelled Treatment:    Reason Eval/Treat Not Completed: OT screened, no needs identified, will sign off. Pt is a 24 y.o. male. Per conversation c RN pt is Independent c ambulation. Pt reports Independent I/ADLs, no skilled acute OT needs identified. Will sign off at this time. Thank you.   Kathie Dike, M.S. OTR/L  05/14/20, 10:46 AM

## 2020-05-14 NOTE — H&P (Signed)
History and Physical    John Schwartz XLK:[id number] DOB: 1996-10-17 DOA: 05/13/2020  PCP: Patient, No Pcp Per   Patient coming from: Home  I have personally briefly reviewed patient's old medical records in [facility]  Chief Complaint: Wheezing, not responding to home meds  HPI: John Schwartz is a 24 y.o. male with medical history significant for severe persistent asthma with multiple visits to the emergency room and admissions for status asthmaticus though without intubation who presents to the emergency room with exacerbation of asthma not responding to home meds.  He denies chest pain, fever or chills ED Course: On arrival he was tachycardic but not hypoxic he received several DuoNeb treatments including Solu-Medrol and magnesium but continued to wheeze.  Hospitalist consulted  Review of Systems: As per HPI otherwise all other systems on review of systems negative.    Past Medical History:  Diagnosis Date  . Asthma   . Bronchospasm     Past Surgical History:  Procedure Laterality Date  . NO PAST SURGERIES       reports that he has never smoked. He has never used smokeless tobacco. He reports previous drug use. Drug: Marijuana. He reports that he does not drink alcohol.  No Known Allergies  Family History  Problem Relation Age of Onset  . Arthritis Paternal Grandmother       Prior to Admission medications   Medication Sig Start Date End Date Taking? Authorizing Provider  acetaminophen (TYLENOL) 325 MG tablet Take 2 tablets (650 mg total) by mouth every 6 (six) hours as needed for mild pain (or Fever >/= 101). 05/27/19  Yes Altamese Dilling, MD  ipratropium-albuterol (DUONEB) 0.5-2.5 (3) MG/3ML SOLN Take 3 mLs by nebulization every 6 (six) hours as needed (wheezing). 08/11/19  Yes Renford Dills, NP  predniSONE (DELTASONE) 20 MG tablet Take 20 mg by mouth 2 (two) times daily with a meal.   Yes [provider]  albuterol (PROVENTIL HFA;VENTOLIN  HFA) 108 (90 Base) MCG/ACT inhaler Inhale 2 puffs into the lungs every 6 (six) hours as needed for wheezing or shortness of breath. 02/01/19   Darci Current, MD  Fluticasone-Salmeterol (ADVAIR DISKUS) 100-50 MCG/DOSE AEPB Inhale 1 puff into the lungs 2 (two) times daily. 07/11/19 08/11/19  Miguel Aschoff., MD    Physical Exam: Vitals:   05/13/20 2142 05/14/20 0128  BP: (!) 146/104 (!) 110/52  Pulse: (!) 107 (!) 117  Resp: (!) 22 19  Temp:  98.3 F (36.8 C)  TempSrc:  Oral  SpO2: 95% 97%     Vitals:   05/13/20 2142 05/14/20 0128  BP: (!) 146/104 (!) 110/52  Pulse: (!) 107 (!) 117  Resp: (!) 22 19  Temp:  98.3 F (36.8 C)  TempSrc:  Oral  SpO2: 95% 97%      Constitutional: .  Appears slightly lethargic.  Dyspnea, speaking in one-word sentences but no accessory muscle use HEENT:      Head: Normocephalic and atraumatic.         Eyes: PERLA, EOMI, Conjunctivae are normal. Sclera is non-icteric.       Mouth/Throat: Mucous membranes are moist.       Neck: Supple with no signs of meningismus. Cardiovascular: Regular rate and rhythm. No murmurs, gallops, or rubs. 2+ symmetrical distal pulses are present . No JVD. No LE edema Respiratory: Respiratory effort increased with tachypnea and wheezes throughout all lung fields, patient speaking in short sentences   Gastrointestinal: Soft, non tender, and non  distended with positive bowel sounds. No rebound or guarding. Genitourinary: No CVA tenderness. Musculoskeletal: Nontender with normal range of motion in all extremities. No cyanosis, or erythema of extremities. Neurologic: Normal speech and language. Face is symmetric. Moving all extremities. No gross focal neurologic deficits . Skin: Skin is warm, dry.  No rash or ulcers Psychiatric: Mood and affect are normal Speech and behavior are normal   Labs on Admission: I have personally reviewed following labs and imaging studies  CBC: Recent Labs  Lab 05/13/20 2312  WBC 11.8*    NEUTROABS 7.4  HGB 15.0  HCT 44.2  MCV 80.5  PLT 259   Basic Metabolic Panel: Recent Labs  Lab 05/13/20 2312  NA 138  K 3.6  CL 104  CO2 25  GLUCOSE 138*  BUN 11  CREATININE 1.26*  CALCIUM 9.0   GFR: CrCl cannot be calculated (Unknown ideal weight.). Liver Function Tests: Recent Labs  Lab 05/13/20 2312  AST 20  ALT 35  ALKPHOS 55  BILITOT 0.7  PROT 7.1  ALBUMIN 4.3   No results for input(s): LIPASE, AMYLASE in the last 168 hours. No results for input(s): AMMONIA in the last 168 hours. Coagulation Profile: No results for input(s): INR, PROTIME in the last 168 hours. Cardiac Enzymes: No results for input(s): CKTOTAL, CKMB, CKMBINDEX, TROPONINI in the last 168 hours. BNP (last 3 results) No results for input(s): PROBNP in the last 8760 hours. HbA1C: No results for input(s): HGBA1C in the last 72 hours. CBG: No results for input(s): GLUCAP in the last 168 hours. Lipid Profile: No results for input(s): CHOL, HDL, LDLCALC, TRIG, CHOLHDL, LDLDIRECT in the last 72 hours. Thyroid Function Tests: No results for input(s): TSH, T4TOTAL, FREET4, T3FREE, THYROIDAB in the last 72 hours. Anemia Panel: No results for input(s): VITAMINB12, FOLATE, FERRITIN, TIBC, IRON, RETICCTPCT in the last 72 hours. Urine analysis: No results found for: COLORURINE, APPEARANCEUR, LABSPEC, PHURINE, GLUCOSEU, HGBUR, BILIRUBINUR, KETONESUR, PROTEINUR, UROBILINOGEN, NITRITE, LEUKOCYTESUR  Radiological Exams on Admission: DG Chest 1 View  Result Date: 05/13/2020 CLINICAL DATA:  Initial evaluation for acute shortness of breath. EXAM: CHEST  1 VIEW COMPARISON:  Prior radiograph from 07/11/2019. FINDINGS: The cardiac and mediastinal silhouettes are stable in size and contour, and remain within normal limits. The lungs are normally inflated. No airspace consolidation, pleural effusion, or pulmonary edema. No pneumothorax. No acute osseous abnormality. IMPRESSION: No radiographic evidence for active  cardiopulmonary disease. Electronically Signed   By: Rise Mu M.D.   On: 05/13/2020 23:13    EKG: No EKG done in the ER  Assessment/Plan 24 year old male with severe persistent asthma with history of several visits to the ER for treatment of asthma exacerbation presents with exacerbation not responding to home treatments or treatment thus far in the ER    Acute exacerbation of severe persistent extrinsic asthma -DuoNebs every 6 with albuterol every 2 as needed wheezing -IV steroids to transition to oral -Close monitoring for worsening respiratory status -Supplemental O2 as needed  DVT prophylaxis: Lovenox  Code Status: full code  Family Communication:  none  Disposition Plan: Back to previous home environment Consults called: none  Status:obs    Andris Baumann MD [facility]     05/14/2020, 2:04 AM

## 2020-05-14 NOTE — ED Notes (Signed)
Pt given breakfast tray

## 2020-05-14 NOTE — Progress Notes (Signed)
PT Cancellation Note  Patient Details Name: John Schwartz MRN: [id number] DOB: [birth year]   Cancelled Treatment:    Reason Eval/Treat Not Completed: PT screened, no needs identified, will sign off (Pt denies any acute impairment in strength, balance, sensorium, or acute changes to mobility or independence. Pt report full independence in ADL/IADL. No need for PT evaluation at this time, will sign off.)  3:11 PM, 05/14/20 Rosamaria Lints, PT, DPT Physical Therapist - [facility]  [phone] (ASCOM)    [name] 05/14/2020, 3:11 PM

## 2020-05-15 DIAGNOSIS — J4551 Severe persistent asthma with (acute) exacerbation: Principal | ICD-10-CM

## 2020-05-15 MED ORDER — PREDNISONE 20 MG PO TABS: 40.0000 mg | ORAL_TABLET | Freq: Every day | ORAL | 0 refills | Status: DC

## 2020-05-15 MED ORDER — ALBUTEROL SULFATE HFA 108 (90 BASE) MCG/ACT IN AERS: 2.0000 | INHALATION_SPRAY | RESPIRATORY_TRACT | 1 refills | Status: DC | PRN

## 2020-05-15 MED ORDER — IPRATROPIUM-ALBUTEROL 0.5-2.5 (3) MG/3ML IN SOLN: 3.0000 mL | Freq: Two times a day (BID) | RESPIRATORY_TRACT | 1 refills | Status: DC

## 2020-05-15 NOTE — Care Management Note (Signed)
Case Management Note  Patient Details  Name: John Schwartz MRN: [id number] Date of Birth: 06/03/1996  Subjective/Objective:         Patient reports that he needs some supplies for his nebulizer machine.  Patient has had the machine for a little under 5 years.             Action/Plan: RNCM reached out to [employer] about where patient can get nebulizer supplies.  Patient can go to the retail store at [address] In [place]- [facility].  Supplies should cost little to nothing.  Medicaid will cover a new machine every 5 years.    Expected Discharge Date:  05/15/20               Expected Discharge Plan:     In-House Referral:     Discharge planning Services     Post Acute Care Choice:    Choice offered to:     DME Arranged:    DME Agency:     HH Arranged:    HH Agency:     Status of Service:     If discussed at [employer], dates discussed:    Additional Comments:  Allayne Butcher, RN 05/15/2020, 10:47 AM

## 2020-05-15 NOTE — Discharge Summary (Signed)
Physician Discharge Summary  John Schwartz [id number] DOB: Mar 17, 1996 DOA: 05/13/2020  PCP: Patient, No Pcp Per  Admit date: 05/13/2020 Discharge date: 05/15/2020  Admitted From: home Disposition:  home  Recommendations for Outpatient Follow-up:  1. Follow up with PCP in 1-2 weeks 2. Please obtain BMP/CBC in one week 3. Please follow up with pulmonology as soon as possible for follow up and consideration of starting new medication to control your asthma  Home Health: No  Equipment/Devices: None   Discharge Condition: Stable  CODE STATUS: Full  Diet recommendation: Regular    Discharge Diagnoses: Active Problems:   Acute exacerbation of severe persistent extrinsic asthma   Acute severe exacerbation of asthma    Summary of HPI and Hospital Course:   John Schwartz is a 24 y.o. male with medical history significant for severe persistent asthma with multiple visits to the emergency room and admissions for status asthmaticus though without intubation who presents to the emergency room with exacerbation of asthma not responding to home meds.  He denies chest pain, fever or chills.  In the ED, he was tachycardic but not hypoxic.  He was treated with several DuoNeb treatments, IVSolu-Medrol and magnesium but continued to wheeze.  Hospitalist service consulted for admission.  Patient reported being on daily prednisone for past 5-6 years.  Has not followed with pulmonology due to the high cost, but requested to establish with local pulmonology.  Referral and appointment request sent to Dr. Clovis Fredrickson office at [facility].  Patient clinically improved, and stable for discharge home today with close pulmonology follow up for consideration of biologic agent for his persistent asthma requiring chronic steroids.   Discharge Instructions continue Prednisone 40 mg daily, then resume your previous dose daily.    Discharge Instructions    Call MD for:   Complete by: As directed    Worsening  shortness of breath or wheezing despite using your inhalers.   Call MD for:  temperature >100.4   Complete by: As directed    Diet - low sodium heart healthy   Complete by: As directed    Discharge instructions   Complete by: As directed    Finish Prednisone 40 mg daily for 6 more days.   Then resume your previous dosing twice daily until you see pulmonology in clinic for follow up.   Increase activity slowly   Complete by: As directed      Allergies as of 05/15/2020   No Known Allergies     Medication List    TAKE these medications   acetaminophen 325 MG tablet Commonly known as: TYLENOL Take 2 tablets (650 mg total) by mouth every 6 (six) hours as needed for mild pain (or Fever >/= 101).   albuterol 108 (90 Base) MCG/ACT inhaler Commonly known as: VENTOLIN HFA Inhale 2 puffs into the lungs every 4 (four) hours as needed for wheezing or shortness of breath.   Fluticasone-Salmeterol 500-50 MCG/DOSE Aepb Commonly known as: ADVAIR Inhale 1 puff into the lungs 2 (two) times daily.   ipratropium-albuterol 0.5-2.5 (3) MG/3ML Soln Commonly known as: DUONEB Take 3 mLs by nebulization 2 (two) times daily. Up to 4 times daily if needed for wheezing or shortness of breath. What changed:   when to take this  reasons to take this  additional instructions   predniSONE 20 MG tablet Commonly known as: DELTASONE Take 2 tablets (40 mg total) by mouth daily with breakfast. Start taking on: May 16, 2020 What changed:   how  much to take  when to take this       Follow-up Information    Erin Fulling, MD. Schedule an appointment as soon as possible for a visit in 1 week(s).   Specialties: Pulmonary Disease, Cardiology Why: Please schedule for follow up as soon after July 11th as possible.  Asthma, hospital follow up. Contact information: [address] [place] [address] [phone]              No Known Allergies  Consultations:  none     Procedures/Studies: DG Chest 1 View  Result Date: 05/13/2020 CLINICAL DATA:  Initial evaluation for acute shortness of breath. EXAM: CHEST  1 VIEW COMPARISON:  Prior radiograph from 07/11/2019. FINDINGS: The cardiac and mediastinal silhouettes are stable in size and contour, and remain within normal limits. The lungs are normally inflated. No airspace consolidation, pleural effusion, or pulmonary edema. No pneumothorax. No acute osseous abnormality. IMPRESSION: No radiographic evidence for active cardiopulmonary disease. Electronically Signed   By: Rise Mu M.D.   On: 05/13/2020 23:13       Subjective: Pt seen at bedside, wife/significant other present.  Patient reports breathing improved.  Denies SOB at rest and has been up walking around without difficulty breathing.  He does want to go see pulmonology soon, but says he can't go until after July 11th.    Discharge Exam: Vitals:   05/15/20 0734 05/15/20 0758  BP:  121/73  Pulse:  99  Resp:  18  Temp:  98.2 F (36.8 C)  SpO2: 93% 94%   Vitals:   05/14/20 2336 05/15/20 0353 05/15/20 0734 05/15/20 0758  BP: 124/81 115/62  121/73  Pulse: 87 85  99  Resp: 17 17  18   Temp: 98.2 F (36.8 C) 97.9 F (36.6 C)  98.2 F (36.8 C)  TempSrc: Oral Oral  Oral  SpO2: 97% 94% 93% 94%  Weight:      Height:        General: Pt is alert, awake, not in acute distress Cardiovascular: RRR, S1/S2 +, no rubs, no gallops Respiratory: fair aeration but somewhat diminished, no wheezing, no rhonchi Abdominal: Soft, NT, ND, bowel sounds + Extremities: no edema, no cyanosis    The results of significant diagnostics from this hospitalization (including imaging, microbiology, ancillary and laboratory) are listed below for reference.     Microbiology: Recent Results (from the past 240 hour(s))  SARS Coronavirus 2 by RT PCR (hospital order, performed in [facility] hospital lab) Nasopharyngeal Nasopharyngeal Swab     Status: None    Collection Time: 05/13/20 10:53 PM   Specimen: Nasopharyngeal Swab  Result Value Ref Range Status   SARS Coronavirus 2 NEGATIVE NEGATIVE Final    Comment: (NOTE) SARS-CoV-2 target nucleic acids are NOT DETECTED.  The SARS-CoV-2 RNA is generally detectable in upper and lower respiratory specimens during the acute phase of infection. The lowest concentration of SARS-CoV-2 viral copies this assay can detect is 250 copies / mL. A negative result does not preclude SARS-CoV-2 infection and should not be used as the sole basis for treatment or other patient management decisions.  A negative result may occur with improper specimen collection / handling, submission of specimen other than nasopharyngeal swab, presence of viral mutation(s) within the areas targeted by this assay, and inadequate number of viral copies (<250 copies / mL). A negative result must be combined with clinical observations, patient history, and epidemiological information.  Fact Sheet for Patients:   05/15/20  Fact Sheet for Healthcare Providers: https://pope.com/  This test is not yet approved or  cleared by the [place] FDA and has been authorized for detection and/or diagnosis of SARS-CoV-2 by FDA under an Emergency Use Authorization (EUA).  This EUA will remain in effect (meaning this test can be used) for the duration of the COVID-19 declaration under Section 564(b)(1) of the Act, 21 U.S.C. section 360bbb-3(b)(1), unless the authorization is terminated or revoked sooner.  Performed at [facility], [address] [place], [place] [address]      Labs: BNP (last 3 results) No results for input(s): BNP in the last 8760 hours. Basic Metabolic Panel: Recent Labs  Lab 05/13/20 2312  NA 138  K 3.6  CL 104  CO2 25  GLUCOSE 138*  BUN 11  CREATININE 1.26*  CALCIUM 9.0   Liver Function Tests: Recent Labs  Lab 05/13/20 2312  AST  20  ALT 35  ALKPHOS 55  BILITOT 0.7  PROT 7.1  ALBUMIN 4.3   No results for input(s): LIPASE, AMYLASE in the last 168 hours. No results for input(s): AMMONIA in the last 168 hours. CBC: Recent Labs  Lab 05/13/20 2312  WBC 11.8*  NEUTROABS 7.4  HGB 15.0  HCT 44.2  MCV 80.5  PLT 259   Cardiac Enzymes: No results for input(s): CKTOTAL, CKMB, CKMBINDEX, TROPONINI in the last 168 hours. BNP: Invalid input(s): POCBNP CBG: No results for input(s): GLUCAP in the last 168 hours. D-Dimer No results for input(s): DDIMER in the last 72 hours. Hgb A1c No results for input(s): HGBA1C in the last 72 hours. Lipid Profile No results for input(s): CHOL, HDL, LDLCALC, TRIG, CHOLHDL, LDLDIRECT in the last 72 hours. Thyroid function studies No results for input(s): TSH, T4TOTAL, T3FREE, THYROIDAB in the last 72 hours.  Invalid input(s): FREET3 Anemia work up No results for input(s): VITAMINB12, FOLATE, FERRITIN, TIBC, IRON, RETICCTPCT in the last 72 hours. Urinalysis No results found for: COLORURINE, APPEARANCEUR, LABSPEC, PHURINE, GLUCOSEU, HGBUR, BILIRUBINUR, KETONESUR, PROTEINUR, UROBILINOGEN, NITRITE, LEUKOCYTESUR Sepsis Labs Invalid input(s): PROCALCITONIN,  WBC,  LACTICIDVEN Microbiology Recent Results (from the past 240 hour(s))  SARS Coronavirus 2 by RT PCR (hospital order, performed in [facility] hospital lab) Nasopharyngeal Nasopharyngeal Swab     Status: None   Collection Time: 05/13/20 10:53 PM   Specimen: Nasopharyngeal Swab  Result Value Ref Range Status   SARS Coronavirus 2 NEGATIVE NEGATIVE Final    Comment: (NOTE) SARS-CoV-2 target nucleic acids are NOT DETECTED.  The SARS-CoV-2 RNA is generally detectable in upper and lower respiratory specimens during the acute phase of infection. The lowest concentration of SARS-CoV-2 viral copies this assay can detect is 250 copies / mL. A negative result does not preclude SARS-CoV-2 infection and should not be used as the  sole basis for treatment or other patient management decisions.  A negative result may occur with improper specimen collection / handling, submission of specimen other than nasopharyngeal swab, presence of viral mutation(s) within the areas targeted by this assay, and inadequate number of viral copies (<250 copies / mL). A negative result must be combined with clinical observations, patient history, and epidemiological information.  Fact Sheet for Patients:   BoilerBrush.com.cy  Fact Sheet for Healthcare Providers: https://pope.com/  This test is not yet approved or  cleared by the [place] FDA and has been authorized for detection and/or diagnosis of SARS-CoV-2 by FDA under an Emergency Use Authorization (EUA).  This EUA will remain in effect (meaning this test can  be used) for the duration of the COVID-19 declaration under Section 564(b)(1) of the Act, 21 U.S.C. section 360bbb-3(b)(1), unless the authorization is terminated or revoked sooner.  Performed at [facility], [address] [place], [place] [id number]      Time coordinating discharge: Over 30 minutes  SIGNED:   Pennie BanterKelly A Magali Bray, DO [facility] 05/15/2020, 9:50 AM   If 7PM-7AM, please contact night-coverage [email or web address]

## 2020-05-28 ENCOUNTER — Institutional Professional Consult (permissible substitution): Payer: Medicaid Other | Admitting: Pulmonary Disease

## 2020-07-10 ENCOUNTER — Institutional Professional Consult (permissible substitution): Payer: Medicaid Other | Admitting: Pulmonary Disease

## 2020-08-08 ENCOUNTER — Institutional Professional Consult (permissible substitution): Payer: Medicaid Other | Admitting: Internal Medicine

## 2021-11-10 ENCOUNTER — Other Ambulatory Visit: Payer: Self-pay

## 2021-11-10 ENCOUNTER — Emergency Department
Admission: EM | Admit: 2021-11-10 | Discharge: 2021-11-11 | Disposition: A | Discharge status: Home / Self Care | Payer: Medicaid Other | Attending: Student in an Organized Health Care Education/Training Program | Admitting: Student in an Organized Health Care Education/Training Program

## 2021-11-10 ENCOUNTER — Encounter: Payer: Self-pay | Admitting: *Deleted

## 2021-11-10 ENCOUNTER — Emergency Department: Payer: Medicaid Other

## 2021-11-10 DIAGNOSIS — Z20822 Contact with and (suspected) exposure to covid-19: Secondary | ICD-10-CM | POA: Diagnosis not present

## 2021-11-10 DIAGNOSIS — Z7951 Long term (current) use of inhaled steroids: Secondary | ICD-10-CM | POA: Insufficient documentation

## 2021-11-10 DIAGNOSIS — R0602 Shortness of breath: Secondary | ICD-10-CM | POA: Diagnosis present

## 2021-11-10 DIAGNOSIS — J4521 Mild intermittent asthma with (acute) exacerbation: Secondary | ICD-10-CM | POA: Insufficient documentation

## 2021-11-10 MED ORDER — ALBUTEROL SULFATE HFA 108 (90 BASE) MCG/ACT IN AERS: 2.0000 | INHALATION_SPRAY | RESPIRATORY_TRACT | Status: DC | PRN

## 2021-11-10 NOTE — ED Triage Notes (Signed)
Pt to ED reporting asthma for the past two days that is not improving with inhaler, nebulizer and prednisone at home. Pt able to ambulate without increased work of breathing but expiratory wheezing noted upon assessment. Pt also reporting mild cough and congestion.

## 2021-11-11 ENCOUNTER — Other Ambulatory Visit: Payer: Self-pay

## 2021-11-11 ENCOUNTER — Encounter: Payer: Self-pay | Admitting: Emergency Medicine

## 2021-11-11 ENCOUNTER — Emergency Department
Admission: EM | Admit: 2021-11-11 | Discharge: 2021-11-11 | Disposition: A | Discharge status: Home / Self Care | Payer: Medicaid Other | Source: Home / Self Care | Attending: Student in an Organized Health Care Education/Training Program | Admitting: Student in an Organized Health Care Education/Training Program

## 2021-11-11 DIAGNOSIS — Z7951 Long term (current) use of inhaled steroids: Secondary | ICD-10-CM | POA: Insufficient documentation

## 2021-11-11 DIAGNOSIS — J4521 Mild intermittent asthma with (acute) exacerbation: Secondary | ICD-10-CM | POA: Insufficient documentation

## 2021-11-11 DIAGNOSIS — J45901 Unspecified asthma with (acute) exacerbation: Secondary | ICD-10-CM

## 2021-11-11 LAB — RESP PANEL BY RT-PCR (FLU A&B, COVID) ARPGX2
Influenza A by PCR: NEGATIVE
Influenza B by PCR: NEGATIVE
SARS Coronavirus 2 by RT PCR: NEGATIVE

## 2021-11-11 MED ORDER — IPRATROPIUM-ALBUTEROL 0.5-2.5 (3) MG/3ML IN SOLN
3.0000 mL | Freq: Once | RESPIRATORY_TRACT | Status: AC
  Administered 2021-11-11: 12:00:00 3 mL via RESPIRATORY_TRACT
  Filled 2021-11-11: qty 3

## 2021-11-11 MED ORDER — DEXAMETHASONE SODIUM PHOSPHATE 10 MG/ML IJ SOLN
10.0000 mg | Freq: Once | INTRAMUSCULAR | Status: AC
  Administered 2021-11-11: 09:00:00 10 mg via INTRAMUSCULAR
  Filled 2021-11-11: qty 1

## 2021-11-11 MED ORDER — ALBUTEROL SULFATE (2.5 MG/3ML) 0.083% IN NEBU
2.5000 mg | INHALATION_SOLUTION | RESPIRATORY_TRACT | 1 refills | Status: DC | PRN
Start: 2021-11-11 — End: 2023-09-11

## 2021-11-11 MED ORDER — ALBUTEROL SULFATE HFA 108 (90 BASE) MCG/ACT IN AERS
2.0000 | INHALATION_SPRAY | Freq: Four times a day (QID) | RESPIRATORY_TRACT | 2 refills | Status: DC | PRN
Start: 2021-11-11 — End: 2023-09-17

## 2021-11-11 NOTE — ED Triage Notes (Signed)
Patient ambulatory to triage with steady gait, without difficulty or distress noted; pt here earlier but left without being seen for c/o wheezing unrelieved by inhaler, recent cough & congestion

## 2021-11-11 NOTE — ED Notes (Signed)
No answer when called several times from lobby 

## 2021-11-11 NOTE — ED Provider Notes (Signed)
[facility] Emergency Department Provider Note   ____________________________________________   Event Date/Time   First MD Initiated Contact with Patient 11/11/21 [phone]     (approximate)  I have reviewed the triage vital signs and the nursing notes.   HISTORY  Chief Complaint Asthma    HPI John Schwartz is a 25 y.o. male presents to the ED with complaint of exacerbation of his asthma for the last 2 days.  Patient states that he has continued using his inhaler with minimal relief.  Patient continues to smoke.  He checked and to the ED earlier in the night but left prior to being seen.  He states that his breathing has not improved but has not worsened since he initially came to the ED.         Past Medical History:  Diagnosis Date   Asthma    Bronchospasm     Patient Active Problem List   Diagnosis Date Noted   Acute severe exacerbation of asthma 05/14/2020   Asthma with status asthmaticus 06/04/2016   Acute hypokalemia 06/04/2016   Noncompliance with medication treatment due to underuse of medication 06/04/2016   Acute renal insufficiency 06/04/2016   Acute asthma exacerbation 06/04/2016   Dyspnea 03/16/2016   Acute exacerbation of severe persistent extrinsic asthma 03/16/2016   Severe persistent asthma 03/12/2014   Status asthmaticus 02/11/2014   Acute respiratory failure (HCC) 02/11/2014    Past Surgical History:  Procedure Laterality Date   NO PAST SURGERIES      Prior to Admission medications   Medication Sig Start Date End Date Taking? Authorizing Provider  albuterol (PROVENTIL) (2.5 MG/3ML) 0.083% nebulizer solution Take 3 mLs (2.5 mg total) by nebulization every 4 (four) hours as needed for wheezing or shortness of breath. 11/11/21 11/11/22 Yes Ginelle Bays L, PA-C  albuterol (VENTOLIN HFA) 108 (90 Base) MCG/ACT inhaler Inhale 2 puffs into the lungs every 6 (six) hours as needed for wheezing or shortness of breath. 11/11/21   Yes Tommi Rumps, PA-C  acetaminophen (TYLENOL) 325 MG tablet Take 2 tablets (650 mg total) by mouth every 6 (six) hours as needed for mild pain (or Fever >/= 101). 05/27/19   Altamese Dilling, MD  Fluticasone-Salmeterol (ADVAIR) 500-50 MCG/DOSE AEPB Inhale 1 puff into the lungs 2 (two) times daily.    [provider]  ipratropium-albuterol (DUONEB) 0.5-2.5 (3) MG/3ML SOLN Take 3 mLs by nebulization 2 (two) times daily. Up to 4 times daily if needed for wheezing or shortness of breath. 05/15/20   Pennie Banter, DO  predniSONE (DELTASONE) 20 MG tablet Take 2 tablets (40 mg total) by mouth daily with breakfast. 05/16/20   Pennie Banter, DO    Allergies Patient has no known allergies.  Family History  Problem Relation Age of Onset   Arthritis Paternal Grandmother     Social History Social History   Tobacco Use   Smoking status: Never   Smokeless tobacco: Never  Vaping Use   Vaping Use: Never used  Substance Use Topics   Alcohol use: No   Drug use: Not Currently    Types: Marijuana    Comment: Hx of smoking marajauna, last used over a year ago    Review of Systems Constitutional: No fever/chills Eyes: No visual changes. ENT: No sore throat. Cardiovascular: Denies chest pain. Respiratory: Denies shortness of breath.  Positive for wheezing. Gastrointestinal: No abdominal pain.  No nausea, no vomiting.   Musculoskeletal: Negative for musculoskeletal pain. Skin: Negative for  rash. Neurological: Negative for headaches, focal weakness or numbness. ____________________________________________   PHYSICAL EXAM:  VITAL SIGNS: ED Triage Vitals  Enc Vitals Group     BP --      Pulse --      Resp --      Temp --      Temp src --      SpO2 --      Weight 11/11/21 0638 173 lb 11.6 oz (78.8 kg)     Height 11/11/21 0638 5\' 10"  (1.778 m)     Head Circumference --      Peak Flow --      Pain Score 11/11/21 0637 0     Pain Loc --      Pain Edu? --       Excl. in GC? --     Constitutional: Alert and oriented. Well appearing and in no acute distress. Eyes: Conjunctivae are normal.  Head: Atraumatic. Nose: No congestion/rhinnorhea. Neck: No stridor.   Cardiovascular: Normal rate, regular rhythm. Grossly normal heart sounds.  Good peripheral circulation. Respiratory: Normal respiratory effort.  No retractions. Lungs bilateral expiratory wheezes are heard throughout.  Patient is able to answer questions in complete sentences without any difficulty. Gastrointestinal: Soft and nontender. No distention.  Musculoskeletal: Moves upper and lower extremities any difficulty.  Normal gait was noted. Neurologic:  Normal speech and language. No gross focal neurologic deficits are appreciated.  Skin:  Skin is warm, dry and intact. No rash noted. Psychiatric: Mood and affect are normal. Speech and behavior are normal.  ____________________________________________   LABS (all labs ordered are listed, but only abnormal results are displayed)  Labs Reviewed - No data to display ____________________________________________ __________________________________________  RADIOLOGY 11/13/21, personally viewed and evaluated these images (plain radiographs) as part of my medical decision making, as well as reviewing the written report by the radiologist.   Official radiology report(s): DG Chest 2 View  Result Date: 11/10/2021 CLINICAL DATA:  Shortness of breath for 2 days EXAM: CHEST - 2 VIEW COMPARISON:  05/13/2020 FINDINGS: The heart size and mediastinal contours are within normal limits. Both lungs are clear. The visualized skeletal structures are unremarkable. IMPRESSION: No active cardiopulmonary disease. Electronically Signed   By: 05/15/2020 M.D.   On: 11/10/2021 23:59    ____________________________________________   PROCEDURES  Procedure(s) performed (including Critical  Care):  Procedures   ____________________________________________   INITIAL IMPRESSION / ASSESSMENT AND PLAN / ED COURSE  As part of my medical decision making, I reviewed the following data within the electronic MEDICAL RECORD NUMBER Notes from prior ED visits and [place] Controlled Substance Database  25 year old male presents to the ED with complaint of exacerbation of his asthma.  Patient currently is out of his albuterol nebulizer solution and has very little of his handheld albuterol left.  Patient does take prednisone 10 mg daily to help control his asthma per his PCP.  He is unaware of any fever.  Patient's respiratory swab was negative for COVID and influenza.  While waiting for this test patient was given Decadron 10 mg IM and improved even more after receiving a DuoNeb nebulizer treatment after results of his respiratory panel were received.  A prescription for albuterol and albuterol solution was sent to his pharmacy.  He is to follow-up with his PCP if any continued problems.  Patient was discharged in improved condition.      Clinical Course as of 11/11/21 1401  Tue Nov 11, 2021  1145  Resp Panel by RT-PCR (Flu A&B, Covid) Nasopharyngeal Swab [RS]    Clinical Course User Index [RS] Tommi Rumps, PA-C     ____________________________________________   FINAL CLINICAL IMPRESSION(S) / ED DIAGNOSES  Final diagnoses:  Mild asthma with exacerbation, unspecified whether persistent     ED Discharge Orders          Ordered    albuterol (VENTOLIN HFA) 108 (90 Base) MCG/ACT inhaler  Every 6 hours PRN        11/11/21 1150    albuterol (PROVENTIL) (2.5 MG/3ML) 0.083% nebulizer solution  Every 4 hours PRN        11/11/21 1150             Note:  This document was prepared using Dragon voice recognition software and may include unintentional dictation errors.    Tommi Rumps, PA-C 11/11/21 1401    Willy Eddy, MD 11/11/21 [phone]

## 2021-11-11 NOTE — Discharge Instructions (Signed)
Follow-up with your primary care provider if any continued problems or urgent care as needed.  Continue with your prednisone as already prescribed.  A prescription for albuterol inhaler and albuterol solution was sent to your pharmacy.  Return to the emergency department if any severe worsening of your breathing or urgent concerns.

## 2021-11-18 ENCOUNTER — Observation Stay
Admission: EM | Admit: 2021-11-18 | Discharge: 2021-11-19 | Disposition: A | Discharge status: Home / Self Care | Payer: Medicaid Other | Attending: Hospitalist | Admitting: Hospitalist

## 2021-11-18 ENCOUNTER — Encounter: Payer: Self-pay | Admitting: Emergency Medicine

## 2021-11-18 ENCOUNTER — Other Ambulatory Visit: Payer: Self-pay

## 2021-11-18 ENCOUNTER — Emergency Department: Payer: Medicaid Other

## 2021-11-18 DIAGNOSIS — Z79899 Other long term (current) drug therapy: Secondary | ICD-10-CM | POA: Diagnosis not present

## 2021-11-18 DIAGNOSIS — J4541 Moderate persistent asthma with (acute) exacerbation: Principal | ICD-10-CM

## 2021-11-18 DIAGNOSIS — Z20822 Contact with and (suspected) exposure to covid-19: Secondary | ICD-10-CM | POA: Diagnosis not present

## 2021-11-18 DIAGNOSIS — J45901 Unspecified asthma with (acute) exacerbation: Secondary | ICD-10-CM | POA: Diagnosis present

## 2021-11-18 DIAGNOSIS — R0602 Shortness of breath: Secondary | ICD-10-CM | POA: Diagnosis present

## 2021-11-18 LAB — BASIC METABOLIC PANEL
Anion gap: 8 (ref 5–15)
BUN: 16 mg/dL (ref 6–20)
CO2: 25 mmol/L (ref 22–32)
Calcium: 9 mg/dL (ref 8.9–10.3)
Chloride: 105 mmol/L (ref 98–111)
Creatinine, Ser: 1.09 mg/dL (ref 0.61–1.24)
GFR, Estimated: >60 mL/min (ref >60)
Glucose, Bld: 93 mg/dL (ref 70–99)
Potassium: 4.3 mmol/L (ref 3.5–5.1)
Sodium: 138 mmol/L (ref 135–145)

## 2021-11-18 LAB — CBC WITH DIFFERENTIAL/PLATELET
Abs Immature Granulocytes: 0.07 10*3/uL (ref 0.00–0.07)
Basophils Absolute: 0.1 10*3/uL (ref 0.0–0.1)
Basophils Relative: 1 %
Eosinophils Absolute: 2 10*3/uL — ABNORMAL HIGH (ref 0.0–0.5)
Eosinophils Relative: 18 %
HCT: 46.5 % (ref 39.0–52.0)
Hemoglobin: 15 g/dL (ref 13.0–17.0)
Immature Granulocytes: 1 %
Lymphocytes Relative: 31 %
Lymphs Abs: 3.4 10*3/uL (ref 0.7–4.0)
MCH: 27 pg (ref 26.0–34.0)
MCHC: 32.3 g/dL (ref 30.0–36.0)
MCV: 83.6 fL (ref 80.0–100.0)
Monocytes Absolute: 0.6 10*3/uL (ref 0.1–1.0)
Monocytes Relative: 6 %
Neutro Abs: 4.8 10*3/uL (ref 1.7–7.7)
Neutrophils Relative %: 43 %
Platelets: 254 10*3/uL (ref 150–400)
RBC: 5.56 MIL/uL (ref 4.22–5.81)
RDW: 13.1 % (ref 11.5–15.5)
WBC: 11.1 10*3/uL — ABNORMAL HIGH (ref 4.0–10.5)
nRBC: 0 % (ref 0.0–0.2)

## 2021-11-18 LAB — RESP PANEL BY RT-PCR (FLU A&B, COVID) ARPGX2
Influenza A by PCR: NEGATIVE
Influenza B by PCR: NEGATIVE
SARS Coronavirus 2 by RT PCR: NEGATIVE

## 2021-11-18 MED ORDER — ACETAMINOPHEN 325 MG PO TABS: 650.0000 mg | ORAL_TABLET | Freq: Four times a day (QID) | ORAL | Status: DC | PRN

## 2021-11-18 MED ORDER — ENOXAPARIN SODIUM 40 MG/0.4ML IJ SOSY
40.0000 mg | PREFILLED_SYRINGE | INTRAMUSCULAR | Status: DC
  Filled 2021-11-18: qty 0.4

## 2021-11-18 MED ORDER — IPRATROPIUM-ALBUTEROL 0.5-2.5 (3) MG/3ML IN SOLN
3.0000 mL | Freq: Once | RESPIRATORY_TRACT | Status: AC
  Administered 2021-11-18: 3 mL via RESPIRATORY_TRACT
  Filled 2021-11-18: qty 3

## 2021-11-18 MED ORDER — METHYLPREDNISOLONE SODIUM SUCC 125 MG IJ SOLR
125.0000 mg | Freq: Once | INTRAMUSCULAR | Status: AC
  Administered 2021-11-18: 125 mg via INTRAVENOUS
  Filled 2021-11-18: qty 2

## 2021-11-18 MED ORDER — ONDANSETRON HCL 4 MG/2ML IJ SOLN: 4.0000 mg | Freq: Three times a day (TID) | INTRAMUSCULAR | Status: DC | PRN

## 2021-11-18 MED ORDER — IPRATROPIUM-ALBUTEROL 0.5-2.5 (3) MG/3ML IN SOLN
3.0000 mL | Freq: Four times a day (QID) | RESPIRATORY_TRACT | Status: DC
  Administered 2021-11-18 – 2021-11-19 (×3): 3 mL via RESPIRATORY_TRACT
  Filled 2021-11-18 (×2): qty 3

## 2021-11-18 MED ORDER — IPRATROPIUM-ALBUTEROL 0.5-2.5 (3) MG/3ML IN SOLN
3.0000 mL | RESPIRATORY_TRACT | Status: DC
  Administered 2021-11-18 (×3): 3 mL via RESPIRATORY_TRACT
  Filled 2021-11-18 (×3): qty 3

## 2021-11-18 MED ORDER — MAGNESIUM SULFATE 2 GM/50ML IV SOLN
2.0000 g | Freq: Once | INTRAVENOUS | Status: AC
  Administered 2021-11-18: 2 g via INTRAVENOUS
  Filled 2021-11-18: qty 50

## 2021-11-18 MED ORDER — ALBUTEROL SULFATE (2.5 MG/3ML) 0.083% IN NEBU: 2.5000 mg | INHALATION_SOLUTION | RESPIRATORY_TRACT | Status: DC | PRN

## 2021-11-18 MED ORDER — METHYLPREDNISOLONE SODIUM SUCC 40 MG IJ SOLR
40.0000 mg | Freq: Two times a day (BID) | INTRAMUSCULAR | Status: DC
  Administered 2021-11-18 – 2021-11-19 (×2): 40 mg via INTRAVENOUS
  Filled 2021-11-18 (×2): qty 1

## 2021-11-18 MED ORDER — DM-GUAIFENESIN ER 30-600 MG PO TB12
1.0000 | ORAL_TABLET | Freq: Two times a day (BID) | ORAL | Status: DC | PRN
  Administered 2021-11-18: 1 via ORAL
  Filled 2021-11-18: qty 1

## 2021-11-18 MED ORDER — IPRATROPIUM-ALBUTEROL 0.5-2.5 (3) MG/3ML IN SOLN
6.0000 mL | Freq: Once | RESPIRATORY_TRACT | Status: AC
  Administered 2021-11-18: 6 mL via RESPIRATORY_TRACT
  Filled 2021-11-18: qty 6

## 2021-11-18 MED ORDER — PREDNISONE 20 MG PO TABS
60.0000 mg | ORAL_TABLET | Freq: Once | ORAL | Status: DC
  Filled 2021-11-18: qty 3

## 2021-11-18 NOTE — TOC Initial Note (Signed)
Transition of Care [facility]) - Initial/Assessment Note    Patient Details  Name: John Schwartz MRN: [id number] Date of Birth: 09/03/1996  Transition of Care [facility]) CM/SW Contact:    Chapman Fitch, RN Phone Number: 11/18/2021, 3:22 PM  Clinical Narrative:                  Transition of Care (TOC) Screening Note   Patient Details  Name: John Schwartz Date of Birth: [birth year]   Transition of [facility]) CM/SW Contact:    Chapman Fitch, RN Phone Number: 11/18/2021, 3:22 PM    Transition of Care [facility]) has reviewed patient and no TOC needs have been identified at this time. We will continue to monitor patient advancement through interdisciplinary progression rounds. If new patient transition needs arise, please place a TOC consult.  Please consult TOC if nebulizer or O2 needed for discharge         Patient Goals and CMS Choice        Expected Discharge Plan and Services                                                Prior Living Arrangements/Services                       Activities of Daily Living      Permission Sought/Granted                  Emotional Assessment              Admission diagnosis:  Moderate persistent asthma with exacerbation [J45.41] Asthma exacerbation [J45.901] Patient Active Problem List   Diagnosis Date Noted   Asthma exacerbation 11/18/2021   Acute severe exacerbation of asthma 05/14/2020   Asthma with status asthmaticus 06/04/2016   Acute hypokalemia 06/04/2016   Noncompliance with medication treatment due to underuse of medication 06/04/2016   Acute renal insufficiency 06/04/2016   Acute asthma exacerbation 06/04/2016   Dyspnea 03/16/2016   Acute exacerbation of severe persistent extrinsic asthma 03/16/2016   Severe persistent asthma 03/12/2014   Status asthmaticus 02/11/2014   Acute respiratory failure (HCC) 02/11/2014   PCP:  Oneita Hurt No Pharmacy:   [facility] [address] ([address]), [place] - [address]) [place] [address] Phone: [phone] Fax: [phone]     Social Determinants of Health (SDOH) Interventions    Readmission Risk Interventions No flowsheet data found.

## 2021-11-18 NOTE — ED Notes (Signed)
Pt SpO2 dropping to 88-93% while sleeping. Pt placed on 2 liters O2 via [place] while sleeping. Will notify Attending MD via Secure Chat.

## 2021-11-18 NOTE — ED Provider Notes (Addendum)
Patient received in signout from Dr. Dolores Frame pending reevaluation after breathing treatments, steroids and IV magnesium in the setting of acute exacerbation of moderately persistent asthma.  Patient still audibly wheezing and auscultation has poor airflow and persistent wheezes diffusely.  He appears uncomfortable and would benefit from further breathing treatments.  He has a history of requiring intubation of the past and was just seen in our ED 1 week ago for the same.  I am concerned that he would fail further outpatient management and would benefit from medical observation admission.  We will consult with hospitalist and anticipate admission.   Delton Prairie, MD 11/18/21 [phone]    Delton Prairie, MD 11/18/21 [phone]

## 2021-11-18 NOTE — ED Notes (Signed)
Pt in with co shob, pt has hx of asthma. States has had recent cold symptoms which exacerbate his asthma. Has been using SVN's at home without relief.

## 2021-11-18 NOTE — H&P (Signed)
History and Physical    John Schwartz [id number] DOB: 01/17/1996 DOA: 11/18/2021  Referring MD/NP/PA:   PCP: Pcp, No   Patient coming from:  The patient is coming from home.  At baseline, pt is independent for most of ADL.        Chief Complaint: Cough, shortness breath, wheezing  HPI: John Schwartz is a 26 y.o. male with medical history significant of asthma, history of intubation due to asthma exacerbation, who presents with cough, shortness breath, wheezing.  Patient states that he has cough, shortness breath and wheezing for more than 2 days, which has been progressively worsening.  Patient has dry cough, chest tightness, no fever or chills.  No nausea, vomiting, diarrhea or abdominal pain.  No symptoms of UTI.  Patient was treated with prednisone and inhaler by PCP without improvement.  ED Course: pt was found to have negative COVID PCR, WBC 11.1, electrolytes renal function okay, temperature normal, blood pressure 157/83, heart rate 82, RR 16, oxygen saturation 95% on room air.  Chest x-ray negative.  Patient is placed on MedSurg bed for observation   Review of Systems:   General: no fevers, chills, no body weight gain, has fatigue HEENT: no blurry vision, hearing changes or sore throat Respiratory: dyspnea, coughing, wheezing CV: has chest tightness, no palpitations GI: no nausea, vomiting, abdominal pain, diarrhea, constipation GU: no dysuria, burning on urination, increased urinary frequency, hematuria  Ext: no leg edema Neuro: no unilateral weakness, numbness, or tingling, no vision change or hearing loss Skin: no rash, no skin tear. MSK: No muscle spasm, no deformity, no limitation of range of movement in spin Heme: No easy bruising.  Travel history: No recent long distant travel.  Allergy: No Known Allergies  Past Medical History:  Diagnosis Date   Asthma    Bronchospasm     Past Surgical History:  Procedure Laterality Date   NO PAST SURGERIES       Social History:  reports that he has never smoked. He has never used smokeless tobacco. He reports that he does not currently use drugs after having used the following drugs: Marijuana. He reports that he does not drink alcohol.  Family History:  Family History  Problem Relation Age of Onset   Arthritis Paternal Grandmother      Prior to Admission medications   Medication Sig Start Date End Date Taking? Authorizing Provider  acetaminophen (TYLENOL) 325 MG tablet Take 2 tablets (650 mg total) by mouth every 6 (six) hours as needed for mild pain (or Fever >/= 101). 05/27/19   Vaughan Basta, MD  albuterol (PROVENTIL) (2.5 MG/3ML) 0.083% nebulizer solution Take 3 mLs (2.5 mg total) by nebulization every 4 (four) hours as needed for wheezing or shortness of breath. 11/11/21 11/11/22  Johnn Hai, PA-C  albuterol (VENTOLIN HFA) 108 (90 Base) MCG/ACT inhaler Inhale 2 puffs into the lungs every 6 (six) hours as needed for wheezing or shortness of breath. 11/11/21   Johnn Hai, PA-C  Fluticasone-Salmeterol (ADVAIR) 500-50 MCG/DOSE AEPB Inhale 1 puff into the lungs 2 (two) times daily.    [provider]  ipratropium-albuterol (DUONEB) 0.5-2.5 (3) MG/3ML SOLN Take 3 mLs by nebulization 2 (two) times daily. Up to 4 times daily if needed for wheezing or shortness of breath. 05/15/20   Ezekiel Slocumb, DO  predniSONE (DELTASONE) 20 MG tablet Take 2 tablets (40 mg total) by mouth daily with breakfast. 05/16/20   Ezekiel Slocumb, DO    Physical  Exam: Vitals:   11/18/21 0611 11/18/21 0619 11/18/21 1105  BP: (!) 157/83  119/84  Pulse: 82  (!) 110  Resp: 16  12  Temp: 98.8 F (37.1 C)    TempSrc: Oral    SpO2: 95%  98%  Weight:  80.7 kg   Height:  5\' 10"  (1.778 m)    General: Not in acute distress HEENT:       Eyes: PERRL, EOMI, no scleral icterus.       ENT: No discharge from the ears and nose, no pharynx injection, no tonsillar enlargement.        Neck: No JVD,  no bruit, no mass felt. Heme: No neck lymph node enlargement. Cardiac: S1/S2, RRR, No murmurs, No gallops or rubs. Respiratory: Has decreased air movement bilaterally.  Has wheezing bilaterally GI: Soft, nondistended, nontender, no rebound pain, no organomegaly, BS present. GU: No hematuria Ext: No pitting leg edema bilaterally. 1+DP/PT pulse bilaterally. Musculoskeletal: No joint deformities, No joint redness or warmth, no limitation of ROM in spin. Skin: No rashes.  Neuro: Alert, oriented X3, cranial nerves II-XII grossly intact, moves all extremities normally.  Psych: Patient is not psychotic, no suicidal or hemocidal ideation.  Labs on Admission: I have personally reviewed following labs and imaging studies  CBC: Recent Labs  Lab 11/18/21 0647  WBC 11.1*  NEUTROABS 4.8  HGB 15.0  HCT 46.5  MCV 83.6  PLT 0000000   Basic Metabolic Panel: Recent Labs  Lab 11/18/21 0647  NA 138  K 4.3  CL 105  CO2 25  GLUCOSE 93  BUN 16  CREATININE 1.09  CALCIUM 9.0   GFR: Estimated Creatinine Clearance: 107 mL/min (by C-G formula based on SCr of 1.09 mg/dL). Liver Function Tests: No results for input(s): AST, ALT, ALKPHOS, BILITOT, PROT, ALBUMIN in the last 168 hours. No results for input(s): LIPASE, AMYLASE in the last 168 hours. No results for input(s): AMMONIA in the last 168 hours. Coagulation Profile: No results for input(s): INR, PROTIME in the last 168 hours. Cardiac Enzymes: No results for input(s): CKTOTAL, CKMB, CKMBINDEX, TROPONINI in the last 168 hours. BNP (last 3 results) No results for input(s): PROBNP in the last 8760 hours. HbA1C: No results for input(s): HGBA1C in the last 72 hours. CBG: No results for input(s): GLUCAP in the last 168 hours. Lipid Profile: No results for input(s): CHOL, HDL, LDLCALC, TRIG, CHOLHDL, LDLDIRECT in the last 72 hours. Thyroid Function Tests: No results for input(s): TSH, T4TOTAL, FREET4, T3FREE, THYROIDAB in the last 72  hours. Anemia Panel: No results for input(s): VITAMINB12, FOLATE, FERRITIN, TIBC, IRON, RETICCTPCT in the last 72 hours. Urine analysis: No results found for: COLORURINE, APPEARANCEUR, LABSPEC, PHURINE, GLUCOSEU, HGBUR, BILIRUBINUR, KETONESUR, PROTEINUR, UROBILINOGEN, NITRITE, LEUKOCYTESUR Sepsis Labs: @LABRCNTIP (procalcitonin:4,lacticidven:4) ) Recent Results (from the past 240 hour(s))  Resp Panel by RT-PCR (Flu A&B, Covid) Nasopharyngeal Swab     Status: None   Collection Time: 11/11/21  6:41 AM   Specimen: Nasopharyngeal Swab; Nasopharyngeal(NP) swabs in vial transport medium  Result Value Ref Range Status   SARS Coronavirus 2 by RT PCR NEGATIVE NEGATIVE Final    Comment: (NOTE) SARS-CoV-2 target nucleic acids are NOT DETECTED.  The SARS-CoV-2 RNA is generally detectable in upper respiratory specimens during the acute phase of infection. The lowest concentration of SARS-CoV-2 viral copies this assay can detect is 138 copies/mL. A negative result does not preclude SARS-Cov-2 infection and should not be used as the sole basis for treatment or other patient management decisions.  A negative result may occur with  improper specimen collection/handling, submission of specimen other than nasopharyngeal swab, presence of viral mutation(s) within the areas targeted by this assay, and inadequate number of viral copies(<138 copies/mL). A negative result must be combined with clinical observations, patient history, and epidemiological information. The expected result is Negative.  Fact Sheet for Patients:  EntrepreneurPulse.com.au  Fact Sheet for Healthcare Providers:  IncredibleEmployment.be  This test is no t yet approved or cleared by the [place] FDA and  has been authorized for detection and/or diagnosis of SARS-CoV-2 by FDA under an Emergency Use Authorization (EUA). This EUA will remain  in effect (meaning this test can be used) for the  duration of the COVID-19 declaration under Section 564(b)(1) of the Act, 21 U.S.C.section 360bbb-3(b)(1), unless the authorization is terminated  or revoked sooner.       Influenza A by PCR NEGATIVE NEGATIVE Final   Influenza B by PCR NEGATIVE NEGATIVE Final    Comment: (NOTE) The Xpert Xpress SARS-CoV-2/FLU/RSV plus assay is intended as an aid in the diagnosis of influenza from Nasopharyngeal swab specimens and should not be used as a sole basis for treatment. Nasal washings and aspirates are unacceptable for Xpert Xpress SARS-CoV-2/FLU/RSV testing.  Fact Sheet for Patients: EntrepreneurPulse.com.au  Fact Sheet for Healthcare Providers: IncredibleEmployment.be  This test is not yet approved or cleared by the [place] FDA and has been authorized for detection and/or diagnosis of SARS-CoV-2 by FDA under an Emergency Use Authorization (EUA). This EUA will remain in effect (meaning this test can be used) for the duration of the COVID-19 declaration under Section 564(b)(1) of the Act, 21 U.S.C. section 360bbb-3(b)(1), unless the authorization is terminated or revoked.  Performed at [facility], [address] [place], [place] [address]   Resp Panel by RT-PCR (Flu A&B, Covid) Nasopharyngeal Swab     Status: None   Collection Time: 11/18/21  6:47 AM   Specimen: Nasopharyngeal Swab; Nasopharyngeal(NP) swabs in vial transport medium  Result Value Ref Range Status   SARS Coronavirus 2 by RT PCR NEGATIVE NEGATIVE Final    Comment: (NOTE) SARS-CoV-2 target nucleic acids are NOT DETECTED.  The SARS-CoV-2 RNA is generally detectable in upper respiratory specimens during the acute phase of infection. The lowest concentration of SARS-CoV-2 viral copies this assay can detect is 138 copies/mL. A negative result does not preclude SARS-Cov-2 infection and should not be used as the sole basis for treatment or other patient management  decisions. A negative result may occur with  improper specimen collection/handling, submission of specimen other than nasopharyngeal swab, presence of viral mutation(s) within the areas targeted by this assay, and inadequate number of viral copies(<138 copies/mL). A negative result must be combined with clinical observations, patient history, and epidemiological information. The expected result is Negative.  Fact Sheet for Patients:  EntrepreneurPulse.com.au  Fact Sheet for Healthcare Providers:  IncredibleEmployment.be  This test is no t yet approved or cleared by the [place] FDA and  has been authorized for detection and/or diagnosis of SARS-CoV-2 by FDA under an Emergency Use Authorization (EUA). This EUA will remain  in effect (meaning this test can be used) for the duration of the COVID-19 declaration under Section 564(b)(1) of the Act, 21 U.S.C.section 360bbb-3(b)(1), unless the authorization is terminated  or revoked sooner.       Influenza A by PCR NEGATIVE NEGATIVE Final   Influenza B by PCR NEGATIVE NEGATIVE Final    Comment: (NOTE) The Xpert Xpress SARS-CoV-2/FLU/RSV plus assay  is intended as an aid in the diagnosis of influenza from Nasopharyngeal swab specimens and should not be used as a sole basis for treatment. Nasal washings and aspirates are unacceptable for Xpert Xpress SARS-CoV-2/FLU/RSV testing.  Fact Sheet for Patients: EntrepreneurPulse.com.au  Fact Sheet for Healthcare Providers: IncredibleEmployment.be  This test is not yet approved or cleared by the [place] FDA and has been authorized for detection and/or diagnosis of SARS-CoV-2 by FDA under an Emergency Use Authorization (EUA). This EUA will remain in effect (meaning this test can be used) for the duration of the COVID-19 declaration under Section 564(b)(1) of the Act, 21 U.S.C. section 360bbb-3(b)(1), unless the  authorization is terminated or revoked.  Performed at [facility], [address], [place], [place] [address]      Radiological Exams on Admission: DG Chest [place] 1 View  Result Date: 11/18/2021 CLINICAL DATA:  26 year old male with shortness of breath. Possible COVID-19. EXAM: PORTABLE CHEST 1 VIEW COMPARISON:  Chest radiographs 11/10/2021 and earlier. FINDINGS: Portable AP upright views at 0658 hours. Lung volumes and mediastinal contours remain normal. Visualized tracheal air column is within normal limits. Allowing for portable technique the lungs are clear. No pneumothorax or pleural effusion. Negative visible bowel gas and osseous structures. IMPRESSION: Negative portable chest. Electronically Signed   By: Genevie Ann M.D.   On: 11/18/2021 07:06     EKG: I have personally reviewed.  Sinus rhythm, QTC 443, LAE, right axis deviation   Assessment/Plan Principal Problem:   Asthma exacerbation   Asthma exacerbation: Chest x-ray negative.  No oxygen desaturation.  Since patient has history of intubation due to asthma exacerbation, will observe patient overnight.  -will place on tele bed for obs -Nebulizers: scheduled Duoneb and prn albuterol -Solu-Medrol 40 mg IV bid  -2 g of magnesium sulfate -Mucinex for cough   -prn nasal cannula oxygen if needed    DVT ppx: sQ Lovenox Code Status: Full code Family Communication: not done, no family member is at bed side.    Disposition Plan:  Anticipate discharge back to previous environment Consults called:  none Admission status and Level of care: Med-Surg:   for obs   Status is: Observation  The patient remains OBS appropriate and will d/c before 2 midnights.        Date of Service 11/18/2021    Ivor Costa [facility]   If 7PM-7AM, please contact night-coverage [email or web address] 11/18/2021, 1:12 PM

## 2021-11-18 NOTE — ED Provider Notes (Signed)
[facility] Provider Note    Event Date/Time   First MD Initiated Contact with Patient 11/18/21 0630     (approximate)   History   Asthma   HPI  John Schwartz is a 26 y.o. male who presents to the ED from home with a chief complaint of shortness of breath and wheezing.  Patient with a history of asthma with prior intubation several years ago; last hospitalization over 1 year ago.  Patient was recently seen in the ED shortly after Christmas for same.  Reports wheezing, shortness of breath with chest tightness.  Denies fever, cough, abdominal pain, nausea, vomiting or diarrhea.     Past Medical History   Past Medical History:  Diagnosis Date   Asthma    Bronchospasm      Active Problem List   Patient Active Problem List   Diagnosis Date Noted   Acute severe exacerbation of asthma 05/14/2020   Asthma with status asthmaticus 06/04/2016   Acute hypokalemia 06/04/2016   Noncompliance with medication treatment due to underuse of medication 06/04/2016   Acute renal insufficiency 06/04/2016   Acute asthma exacerbation 06/04/2016   Dyspnea 03/16/2016   Acute exacerbation of severe persistent extrinsic asthma 03/16/2016   Severe persistent asthma 03/12/2014   Status asthmaticus 02/11/2014   Acute respiratory failure (HCC) 02/11/2014     Past Surgical History   Past Surgical History:  Procedure Laterality Date   NO PAST SURGERIES       Home Medications   Prior to Admission medications   Medication Sig Start Date End Date Taking? Authorizing Provider  acetaminophen (TYLENOL) 325 MG tablet Take 2 tablets (650 mg total) by mouth every 6 (six) hours as needed for mild pain (or Fever >/= 101). 05/27/19   Altamese Dilling, MD  albuterol (PROVENTIL) (2.5 MG/3ML) 0.083% nebulizer solution Take 3 mLs (2.5 mg total) by nebulization every 4 (four) hours as needed for wheezing or shortness of breath. 11/11/21 11/11/22  Tommi Rumps, PA-C   albuterol (VENTOLIN HFA) 108 (90 Base) MCG/ACT inhaler Inhale 2 puffs into the lungs every 6 (six) hours as needed for wheezing or shortness of breath. 11/11/21   Tommi Rumps, PA-C  Fluticasone-Salmeterol (ADVAIR) 500-50 MCG/DOSE AEPB Inhale 1 puff into the lungs 2 (two) times daily.    [provider]  ipratropium-albuterol (DUONEB) 0.5-2.5 (3) MG/3ML SOLN Take 3 mLs by nebulization 2 (two) times daily. Up to 4 times daily if needed for wheezing or shortness of breath. 05/15/20   Pennie Banter, DO  predniSONE (DELTASONE) 20 MG tablet Take 2 tablets (40 mg total) by mouth daily with breakfast. 05/16/20   Pennie Banter, DO     Allergies  Patient has no known allergies.   Family History   Family History  Problem Relation Age of Onset   Arthritis Paternal Grandmother      Physical Exam  Triage Vital Signs: ED Triage Vitals  Enc Vitals Group     BP 11/18/21 0611 (!) 157/83     Pulse Rate 11/18/21 0611 82     Resp 11/18/21 0611 16     Temp 11/18/21 0611 98.8 F (37.1 C)     Temp Source 11/18/21 0611 Oral     SpO2 11/18/21 0611 95 %     Weight 11/18/21 0619 178 lb (80.7 kg)     Height 11/18/21 0619 5\' 10"  (1.778 m)     Head Circumference --      Peak Flow --  Pain Score 11/18/21 0619 0     Pain Loc --      Pain Edu? --      Excl. in GC? --     Updated Vital Signs: BP (!) 157/83 (BP Location: Left Arm)    Pulse 82    Temp 98.8 F (37.1 C) (Oral)    Resp 16    Ht 5\' 10"  (1.778 m)    Wt 80.7 kg    SpO2 95%    BMI 25.54 kg/m    General: Awake, moderate distress.  CV:  Good peripheral perfusion.  Resp:  Normal effort.  Very diminished bilaterally with audible wheezing. Abd:  No distention.  Other:     ED Results / Procedures / Treatments  Labs (all labs ordered are listed, but only abnormal results are displayed) Labs Reviewed  RESP PANEL BY RT-PCR (FLU A&B, COVID) ARPGX2  CBC WITH DIFFERENTIAL/PLATELET  BASIC METABOLIC PANEL      EKG  Pending    RADIOLOGY Pending   Official radiology report(s): No results found.   PROCEDURES:  Critical Care performed: No  .1-3 Lead EKG Interpretation Performed by: , MD Authorized by: Irean Hong, MD     Interpretation: normal     ECG rate:  80   ECG rate assessment: normal     Rhythm: sinus rhythm     Ectopy: none     Conduction: normal   Comments:     Patient placed on cardiac monitor to evaluate for arrhythmias   MEDICATIONS ORDERED IN ED: Medications  magnesium sulfate IVPB 2 g 50 mL (has no administration in time range)  ipratropium-albuterol (DUONEB) 0.5-2.5 (3) MG/3ML nebulizer solution 3 mL (3 mLs Nebulization Given 11/18/21 0635)  ipratropium-albuterol (DUONEB) 0.5-2.5 (3) MG/3ML nebulizer solution 3 mL (3 mLs Nebulization Given 11/18/21 0643)  methylPREDNISolone sodium succinate (SOLU-MEDROL) 125 mg/2 mL injection 125 mg (125 mg Intravenous Given 11/18/21 0644)     IMPRESSION / MDM / ASSESSMENT AND PLAN / ED COURSE  I reviewed the triage vital signs and the nursing notes.                              Differential diagnosis includes, but is not limited to, 26 year old male presenting with shortness of breath, history of asthma. Differential includes, but is not limited to, viral syndrome, bronchitis including COPD exacerbation, pneumonia, reactive airway disease including asthma, CHF including exacerbation with or without pulmonary/interstitial edema, pneumothorax, ACS, thoracic trauma, and pulmonary embolism.   The patient is on the cardiac monitor to evaluate for evidence of arrhythmia and/or significant heart rate changes.  Patient with significant diminished aeration and audible wheezing.  Will obtain basic lab work, chest x-ray, respiratory panel.  Initiate treatment with 125 mg IV Solu-Medrol and DuoNeb.  Will reassess.  I have personally reviewed patient's charts and see that he was last admitted for asthma exacerbation on  05/13/2020.   Clinical Course as of 11/18/21 0700  Tue Nov 18, 2021  Nov 20, 2021 Care transferred to Dr. [id number] at change of shift pending all lab, cxr, ekg results and reassessment. [JS]    Clinical Course User Index [JS] Erma Heritage, MD     FINAL CLINICAL IMPRESSION(S) / ED DIAGNOSES   Final diagnoses:  Moderate persistent asthma with exacerbation     Rx / DC Orders   ED Discharge Orders     None        Note:  This document was prepared using Dragon voice recognition software and may include unintentional dictation errors.   Irean HongSung, Keysha Damewood J, MD 11/18/21 [phone]

## 2021-11-18 NOTE — ED Triage Notes (Signed)
Pt to triage via w/c, audible wheezing; pt with hx asthma unrelieved by inhaler & nebs at home; recently seen for same & received steroids

## 2021-11-19 DIAGNOSIS — J4541 Moderate persistent asthma with (acute) exacerbation: Secondary | ICD-10-CM | POA: Diagnosis not present

## 2021-11-19 LAB — HIV ANTIBODY (ROUTINE TESTING W REFLEX): HIV Screen 4th Generation wRfx: NONREACTIVE

## 2021-11-19 LAB — CBC
HCT: 43.2 % (ref 39.0–52.0)
Hemoglobin: 14.5 g/dL (ref 13.0–17.0)
MCH: 27.6 pg (ref 26.0–34.0)
MCHC: 33.6 g/dL (ref 30.0–36.0)
MCV: 82.1 fL (ref 80.0–100.0)
Platelets: 280 10*3/uL (ref 150–400)
RBC: 5.26 MIL/uL (ref 4.22–5.81)
RDW: 13.1 % (ref 11.5–15.5)
WBC: 19.4 10*3/uL — ABNORMAL HIGH (ref 4.0–10.5)
nRBC: 0 % (ref 0.0–0.2)

## 2021-11-19 MED ORDER — IPRATROPIUM-ALBUTEROL 0.5-2.5 (3) MG/3ML IN SOLN: 3.0000 mL | Freq: Four times a day (QID) | RESPIRATORY_TRACT | 2 refills | Status: DC | PRN

## 2021-11-19 MED ORDER — PREDNISONE 10 MG PO TABS: 20.0000 mg | ORAL_TABLET | Freq: Every day | ORAL | 1 refills | Status: DC

## 2021-11-19 MED ORDER — FLUTICASONE-SALMETEROL 500-50 MCG/DOSE IN AEPB: 1.0000 | INHALATION_SPRAY | Freq: Two times a day (BID) | RESPIRATORY_TRACT | 2 refills | Status: DC

## 2021-11-19 MED ORDER — PREDNISONE 20 MG PO TABS: 40.0000 mg | ORAL_TABLET | Freq: Every day | ORAL | Status: DC

## 2021-11-19 MED ORDER — PREDNISONE 20 MG PO TABS: 40.0000 mg | ORAL_TABLET | Freq: Every day | ORAL | 0 refills | Status: AC

## 2021-11-19 NOTE — TOC Progression Note (Signed)
Transition of Care [facility]) - Progression Note    Patient Details  Name: John Schwartz MRN: [id number] Date of Birth: 1996-04-03  [facility] of [facility]) CM/SW Contact  Beverly Sessions, RN Phone Number: 11/19/2021, 1:06 PM  Clinical Narrative:     Patient weaned to RA.  Patient confirms that he has a working nebulizer at home, but needs rx for solution.  MD notified  Patient confirms that he does not have PCP.  I encouraged patient to follow up with Medicaid to determine who his PCP is        Expected Discharge Plan and Services                                                 Social Determinants of Health (SDOH) Interventions    Readmission Risk Interventions No flowsheet data found.

## 2021-11-19 NOTE — Plan of Care (Signed)
°  Problem: Clinical Measurements: Goal: Ability to maintain clinical measurements within normal limits will improve Outcome: Progressing Goal: Will remain free from infection Outcome: Progressing Goal: Diagnostic test results will improve Outcome: Progressing Goal: Respiratory complications will improve Outcome: Progressing Goal: Cardiovascular complication will be avoided Outcome: Progressing   Pt is involved in and agrees with the plan of care. V/S stable except HR running around 100-110s. No complaints of pain or SOB. Wheezing noted on upper lung.

## 2021-11-19 NOTE — Discharge Summary (Signed)
Physician Discharge Summary   John Schwartz  male DOB: 27-Jul-1996  RS:[id number]  PCP: Pcp, No  Admit date: 11/18/2021 Discharge date: 11/19/2021  Admitted From: home Disposition:  home CODE STATUS: Full code  Discharge Instructions     Ambulatory referral to Pulmonology   Complete by: As directed    Reason for referral: Asthma/COPD   Discharge instructions   Complete by: As directed    Since you have been on prednisone 20 mg daily for over a year, your asthma exacerbation was likely due to stopping steroid.  I have prescribed you prednisone 40 mg daily for 3 more days, and after that, continue to take prednisone 20 mg daily until you see a pulmonologist.    I have also refilled your  Advair and DuoNeb.   Dr. Enzo Bi [facility] Course:  For full details, please see H&P, progress notes, consult notes and ancillary notes.  Briefly,  John Schwartz is a 26 y.o. male with medical history significant of asthma, history of intubation due to asthma exacerbation, who presented with cough, shortness breath, wheezing.  Asthma exacerbation:  Chest x-ray negative.  No oxygen desaturation.  Pt reported he had been on prednisone 20 mg daily for over a year, and was recently tapered off quickly, so this asthma exacerbation was likely due to stopping steroid.  Pt received IV solumedrol on presentation and 2 g of magnesium sulfate and was already feeling better the next day.   --Pt was discharged on prednisone 40 mg daily for 3 more days, and then 20 mg daily until pt follows up with outpatient pulm (need to establish).  Pt will need long taper to get off steroid, as directed by outpatient pulm. --cont home [place] and DuoNeb, refilled.   Discharge Diagnoses:  Principal Problem:   Asthma exacerbation     Discharge Instructions:  Allergies as of 11/19/2021   No Known Allergies      Medication List     TAKE these medications    acetaminophen 325 MG  tablet Commonly known as: TYLENOL Take 2 tablets (650 mg total) by mouth every 6 (six) hours as needed for mild pain (or Fever >/= 101).   albuterol 108 (90 Base) MCG/ACT inhaler Commonly known as: VENTOLIN HFA Inhale 2 puffs into the lungs every 6 (six) hours as needed for wheezing or shortness of breath.   albuterol (2.5 MG/3ML) 0.083% nebulizer solution Commonly known as: PROVENTIL Take 3 mLs (2.5 mg total) by nebulization every 4 (four) hours as needed for wheezing or shortness of breath.   Fluticasone-Salmeterol 500-50 MCG/DOSE Aepb Commonly known as: ADVAIR Inhale 1 puff into the lungs 2 (two) times daily. What changed: when to take this   ipratropium-albuterol 0.5-2.5 (3) MG/3ML Soln Commonly known as: DUONEB Take 3 mLs by nebulization every 6 (six) hours as needed. Up to 4 times daily if needed for wheezing or shortness of breath. What changed:  when to take this reasons to take this   predniSONE 20 MG tablet Commonly known as: DELTASONE Take 2 tablets (40 mg total) by mouth daily with breakfast for 3 days. Start taking on: November 20, 2021   predniSONE 10 MG tablet Commonly known as: DELTASONE Take 2 tablets (20 mg total) by mouth daily. Start taking on: November 23, 2021         Follow-up Information     Ottie Glazier, MD Follow up in 1 week(s).   Specialty: Pulmonary  Disease Why: and establish care. Contact information: [place] [address] [phone]                 No Known Allergies   The results of significant diagnostics from this hospitalization (including imaging, microbiology, ancillary and laboratory) are listed below for reference.   Consultations:   Procedures/Studies: DG Chest 2 View  Result Date: 11/10/2021 CLINICAL DATA:  Shortness of breath for 2 days EXAM: CHEST - 2 VIEW COMPARISON:  05/13/2020 FINDINGS: The heart size and mediastinal contours are within normal limits. Both lungs are clear. The  visualized skeletal structures are unremarkable. IMPRESSION: No active cardiopulmonary disease. Electronically Signed   By: Inez Catalina M.D.   On: 11/10/2021 23:59   DG Chest Port 1 View  Result Date: 11/18/2021 CLINICAL DATA:  26 year old male with shortness of breath. Possible COVID-19. EXAM: PORTABLE CHEST 1 VIEW COMPARISON:  Chest radiographs 11/10/2021 and earlier. FINDINGS: Portable AP upright views at 0658 hours. Lung volumes and mediastinal contours remain normal. Visualized tracheal air column is within normal limits. Allowing for portable technique the lungs are clear. No pneumothorax or pleural effusion. Negative visible bowel gas and osseous structures. IMPRESSION: Negative portable chest. Electronically Signed   By: Genevie Ann M.D.   On: 11/18/2021 07:06      Labs: BNP (last 3 results) No results for input(s): BNP in the last 8760 hours. Basic Metabolic Panel: Recent Labs  Lab 11/18/21 0647  NA 138  K 4.3  CL 105  CO2 25  GLUCOSE 93  BUN 16  CREATININE 1.09  CALCIUM 9.0   Liver Function Tests: No results for input(s): AST, ALT, ALKPHOS, BILITOT, PROT, ALBUMIN in the last 168 hours. No results for input(s): LIPASE, AMYLASE in the last 168 hours. No results for input(s): AMMONIA in the last 168 hours. CBC: Recent Labs  Lab 11/18/21 0647 11/19/21 0608  WBC 11.1* 19.4*  NEUTROABS 4.8  --   HGB 15.0 14.5  HCT 46.5 43.2  MCV 83.6 82.1  PLT 254 280   Cardiac Enzymes: No results for input(s): CKTOTAL, CKMB, CKMBINDEX, TROPONINI in the last 168 hours. BNP: Invalid input(s): POCBNP CBG: No results for input(s): GLUCAP in the last 168 hours. D-Dimer No results for input(s): DDIMER in the last 72 hours. Hgb A1c No results for input(s): HGBA1C in the last 72 hours. Lipid Profile No results for input(s): CHOL, HDL, LDLCALC, TRIG, CHOLHDL, LDLDIRECT in the last 72 hours. Thyroid function studies No results for input(s): TSH, T4TOTAL, T3FREE, THYROIDAB in the last 72  hours.  Invalid input(s): FREET3 Anemia work up No results for input(s): VITAMINB12, FOLATE, FERRITIN, TIBC, IRON, RETICCTPCT in the last 72 hours. Urinalysis No results found for: COLORURINE, APPEARANCEUR, [place], [place], GLUCOSEU, [place], [place], [place], PROTEINUR, UROBILINOGEN, NITRITE, LEUKOCYTESUR Sepsis Labs Invalid input(s): PROCALCITONIN,  WBC,  LACTICIDVEN Microbiology Recent Results (from the past 240 hour(s))  Resp Panel by RT-PCR (Flu A&B, Covid) Nasopharyngeal Swab     Status: None   Collection Time: 11/11/21  6:41 AM   Specimen: Nasopharyngeal Swab; Nasopharyngeal(NP) swabs in vial transport medium  Result Value Ref Range Status   SARS Coronavirus 2 by RT PCR NEGATIVE NEGATIVE Final    Comment: (NOTE) SARS-CoV-2 target nucleic acids are NOT DETECTED.  The SARS-CoV-2 RNA is generally detectable in upper respiratory specimens during the acute phase of infection. The lowest concentration of SARS-CoV-2 viral copies this assay can detect is 138 copies/mL. A negative result does not preclude SARS-Cov-2 infection and  should not be used as the sole basis for treatment or other patient management decisions. A negative result may occur with  improper specimen collection/handling, submission of specimen other than nasopharyngeal swab, presence of viral mutation(s) within the areas targeted by this assay, and inadequate number of viral copies(<138 copies/mL). A negative result must be combined with clinical observations, patient history, and epidemiological information. The expected result is Negative.  Fact Sheet for Patients:  EntrepreneurPulse.com.au  Fact Sheet for Healthcare Providers:  IncredibleEmployment.be  This test is no t yet approved or cleared by the [place] FDA and  has been authorized for detection and/or diagnosis of SARS-CoV-2 by FDA under an Emergency Use Authorization (EUA). This EUA will remain  in  effect (meaning this test can be used) for the duration of the COVID-19 declaration under Section 564(b)(1) of the Act, 21 U.S.C.section 360bbb-3(b)(1), unless the authorization is terminated  or revoked sooner.       Influenza A by PCR NEGATIVE NEGATIVE Final   Influenza B by PCR NEGATIVE NEGATIVE Final    Comment: (NOTE) The Xpert Xpress SARS-CoV-2/FLU/RSV plus assay is intended as an aid in the diagnosis of influenza from Nasopharyngeal swab specimens and should not be used as a sole basis for treatment. Nasal washings and aspirates are unacceptable for Xpert Xpress SARS-CoV-2/FLU/RSV testing.  Fact Sheet for Patients: EntrepreneurPulse.com.au  Fact Sheet for Healthcare Providers: IncredibleEmployment.be  This test is not yet approved or cleared by the [place] FDA and has been authorized for detection and/or diagnosis of SARS-CoV-2 by FDA under an Emergency Use Authorization (EUA). This EUA will remain in effect (meaning this test can be used) for the duration of the COVID-19 declaration under Section 564(b)(1) of the Act, 21 U.S.C. section 360bbb-3(b)(1), unless the authorization is terminated or revoked.  Performed at [facility], [address] [place], [place] [address]   Resp Panel by RT-PCR (Flu A&B, Covid) Nasopharyngeal Swab     Status: None   Collection Time: 11/18/21  6:47 AM   Specimen: Nasopharyngeal Swab; Nasopharyngeal(NP) swabs in vial transport medium  Result Value Ref Range Status   SARS Coronavirus 2 by RT PCR NEGATIVE NEGATIVE Final    Comment: (NOTE) SARS-CoV-2 target nucleic acids are NOT DETECTED.  The SARS-CoV-2 RNA is generally detectable in upper respiratory specimens during the acute phase of infection. The lowest concentration of SARS-CoV-2 viral copies this assay can detect is 138 copies/mL. A negative result does not preclude SARS-Cov-2 infection and should not be used as the sole basis  for treatment or other patient management decisions. A negative result may occur with  improper specimen collection/handling, submission of specimen other than nasopharyngeal swab, presence of viral mutation(s) within the areas targeted by this assay, and inadequate number of viral copies(<138 copies/mL). A negative result must be combined with clinical observations, patient history, and epidemiological information. The expected result is Negative.  Fact Sheet for Patients:  EntrepreneurPulse.com.au  Fact Sheet for Healthcare Providers:  IncredibleEmployment.be  This test is no t yet approved or cleared by the [place] FDA and  has been authorized for detection and/or diagnosis of SARS-CoV-2 by FDA under an Emergency Use Authorization (EUA). This EUA will remain  in effect (meaning this test can be used) for the duration of the COVID-19 declaration under Section 564(b)(1) of the Act, 21 U.S.C.section 360bbb-3(b)(1), unless the authorization is terminated  or revoked sooner.       Influenza A by PCR NEGATIVE NEGATIVE Final   Influenza B by  PCR NEGATIVE NEGATIVE Final    Comment: (NOTE) The Xpert Xpress SARS-CoV-2/FLU/RSV plus assay is intended as an aid in the diagnosis of influenza from Nasopharyngeal swab specimens and should not be used as a sole basis for treatment. Nasal washings and aspirates are unacceptable for Xpert Xpress SARS-CoV-2/FLU/RSV testing.  Fact Sheet for Patients: EntrepreneurPulse.com.au  Fact Sheet for Healthcare Providers: IncredibleEmployment.be  This test is not yet approved or cleared by the [place] FDA and has been authorized for detection and/or diagnosis of SARS-CoV-2 by FDA under an Emergency Use Authorization (EUA). This EUA will remain in effect (meaning this test can be used) for the duration of the COVID-19 declaration under Section 564(b)(1) of the Act, 21  U.S.C. section 360bbb-3(b)(1), unless the authorization is terminated or revoked.  Performed at [facility], [address] [place],  [address]      Total time spend on discharging this patient, including the last patient exam, discussing the hospital stay, instructions for ongoing care as it relates to all pertinent caregivers, as well as preparing the medical discharge records, prescriptions, and/or referrals as applicable, is 45 minutes.    Enzo Bi, MD  [facility] 11/19/2021, 2:07 PM

## 2021-12-16 ENCOUNTER — Emergency Department
Admission: EM | Admit: 2021-12-16 | Discharge: 2021-12-16 | Disposition: A | Discharge status: Home / Self Care | Payer: Medicaid Other | Attending: Emergency Medicine | Admitting: Emergency Medicine

## 2021-12-16 ENCOUNTER — Ambulatory Visit
Admission: EM | Admit: 2021-12-16 | Discharge: 2021-12-16 | Disposition: A | Discharge status: Home / Self Care | Payer: Medicaid Other | Attending: Emergency Medicine | Admitting: Emergency Medicine

## 2021-12-16 ENCOUNTER — Other Ambulatory Visit: Payer: Self-pay

## 2021-12-16 ENCOUNTER — Emergency Department: Payer: Medicaid Other

## 2021-12-16 DIAGNOSIS — M79602 Pain in left arm: Secondary | ICD-10-CM | POA: Insufficient documentation

## 2021-12-16 DIAGNOSIS — S5002XA Contusion of left elbow, initial encounter: Secondary | ICD-10-CM | POA: Diagnosis not present

## 2021-12-16 DIAGNOSIS — Z5321 Procedure and treatment not carried out due to patient leaving prior to being seen by health care provider: Secondary | ICD-10-CM | POA: Diagnosis not present

## 2021-12-16 DIAGNOSIS — W2119XA Struck by other bat, racquet or club, initial encounter: Secondary | ICD-10-CM | POA: Diagnosis not present

## 2021-12-16 NOTE — ED Notes (Signed)
No answer when called several times from lobby 

## 2021-12-16 NOTE — ED Notes (Addendum)
No answer when called several times in lobby 
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
## 2021-12-16 NOTE — Discharge Instructions (Addendum)
Rest,ice,wear ace wrap. Your xray was negative. Follow up with Orthopedics of your choice([facility] info given) in 1 week if pain persists. May alternate tylenol/ibuprofen as label directed.
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
## 2023-02-04 ENCOUNTER — Ambulatory Visit
Admission: EM | Admit: 2023-02-04 | Discharge: 2023-02-04 | Disposition: A | Discharge status: Home / Self Care | Payer: Medicaid Other | Attending: Emergency Medicine | Admitting: Emergency Medicine

## 2023-02-04 ENCOUNTER — Other Ambulatory Visit: Payer: Self-pay

## 2023-02-04 DIAGNOSIS — Z1152 Encounter for screening for COVID-19: Secondary | ICD-10-CM | POA: Diagnosis not present

## 2023-02-04 DIAGNOSIS — J069 Acute upper respiratory infection, unspecified: Secondary | ICD-10-CM | POA: Insufficient documentation

## 2023-02-04 LAB — RESP PANEL BY RT-PCR (RSV, FLU A&B, COVID)  RVPGX2
Influenza A by PCR: NEGATIVE
Influenza B by PCR: NEGATIVE
Resp Syncytial Virus by PCR: NEGATIVE
SARS Coronavirus 2 by RT PCR: NEGATIVE

## 2023-02-04 MED ORDER — PREDNISONE 20 MG PO TABS: 40.0000 mg | ORAL_TABLET | Freq: Every day | ORAL | 0 refills | Status: DC

## 2023-02-04 MED ORDER — AMOXICILLIN 500 MG PO CAPS: 500.0000 mg | ORAL_CAPSULE | Freq: Two times a day (BID) | ORAL | 0 refills | Status: AC

## 2023-02-04 NOTE — ED Triage Notes (Signed)
Pt c/o fever cough, nasal congestion and pressure with headache x 1 wk no relief from OTC meds

## 2023-02-04 NOTE — ED Provider Notes (Signed)
[facility]    CSN: [id number] Arrival date & time: 02/04/23  1444      History   Chief Complaint Chief Complaint  Patient presents with   Nasal Congestion   Headache   Fever    Fever x 2 days   Cough    Cough since Monday    HPI John Schwartz is a 27 y.o. male.   Patient presents for evaluation of fever, chills, nasal congestion, rhinorrhea, cough, headaches, bilateral ear pain and wheezing present for 7 days.  Cough is productive.  Relates bilateral ear pain as residual from headache.  No known sick contacts prior.  Tolerating food and liquids.  History of asthma.  Has attempted use of Benadryl, Mucinex, TheraFlu and 2 old prednisone tablets.    Past Medical History:  Diagnosis Date   Asthma    Bronchospasm     Patient Active Problem List   Diagnosis Date Noted   Contusion of left elbow 12/16/2021   Asthma exacerbation 11/18/2021   Acute severe exacerbation of asthma 05/14/2020   Asthma with status asthmaticus 06/04/2016   Acute hypokalemia 06/04/2016   Noncompliance with medication treatment due to underuse of medication 06/04/2016   Acute renal insufficiency 06/04/2016   Acute asthma exacerbation 06/04/2016   Dyspnea 03/16/2016   Acute exacerbation of severe persistent extrinsic asthma 03/16/2016   Severe persistent asthma 03/12/2014   Status asthmaticus 02/11/2014   Acute respiratory failure ([place]) 02/11/2014    Past Surgical History:  Procedure Laterality Date   NO PAST SURGERIES         Home Medications    Prior to Admission medications   Medication Sig Start Date End Date Taking? Authorizing Provider  acetaminophen (TYLENOL) 325 MG tablet Take 2 tablets (650 mg total) by mouth every 6 (six) hours as needed for mild pain (or Fever >/= 101). 05/27/19   Vaughan Basta, MD  albuterol (PROVENTIL) (2.5 MG/3ML) 0.083% nebulizer solution Take 3 mLs (2.5 mg total) by nebulization every 4 (four) hours as needed for wheezing or shortness  of breath. 11/11/21 11/11/22  Johnn Hai, PA-C  albuterol (VENTOLIN HFA) 108 (90 Base) MCG/ACT inhaler Inhale 2 puffs into the lungs every 6 (six) hours as needed for wheezing or shortness of breath. 11/11/21   Johnn Hai, PA-C  Fluticasone-Salmeterol (ADVAIR) 500-50 MCG/DOSE AEPB Inhale 1 puff into the lungs 2 (two) times daily. 11/19/21 02/17/22  Enzo Bi, MD  ipratropium-albuterol (DUONEB) 0.5-2.5 (3) MG/3ML SOLN Take 3 mLs by nebulization every 6 (six) hours as needed. Up to 4 times daily if needed for wheezing or shortness of breath. 11/19/21   Enzo Bi, MD  predniSONE (DELTASONE) 10 MG tablet Take 2 tablets (20 mg total) by mouth daily. 11/23/21   Enzo Bi, MD    Family History Family History  Problem Relation Age of Onset   Arthritis Paternal Grandmother     Social History Social History   Tobacco Use   Smoking status: Never   Smokeless tobacco: Never  Vaping Use   Vaping Use: Never used  Substance Use Topics   Alcohol use: No   Drug use: Not Currently    Types: Marijuana    Comment: Hx of smoking mar[place], last used over a year ago     Allergies   Patient has no known allergies.   Review of Systems Review of Systems  Constitutional:  Positive for chills and fever. Negative for activity change, appetite change, diaphoresis, fatigue and unexpected weight change.  HENT:  Positive for congestion, ear pain and rhinorrhea. Negative for dental problem, drooling, ear discharge, facial swelling, hearing loss, mouth sores, nosebleeds, postnasal drip, sinus pressure, sinus pain, sneezing, sore throat, tinnitus, trouble swallowing and voice change.   Respiratory:  Positive for cough, shortness of breath and wheezing. Negative for apnea, choking, chest tightness and stridor.   Cardiovascular: Negative.   Gastrointestinal: Negative.   Neurological:  Positive for headaches. Negative for dizziness, tremors, seizures, syncope, facial asymmetry, speech difficulty, weakness,  light-headedness and numbness.     Physical Exam Triage Vital Signs ED Triage Vitals  Enc Vitals Group     BP 02/04/23 1500 (!) 139/90     Pulse Rate 02/04/23 1500 94     Resp 02/04/23 1500 20     Temp 02/04/23 1500 99.1 F (37.3 C)     Temp Source 02/04/23 1500 Oral     SpO2 02/04/23 1500 95 %     Weight --      Height --      Head Circumference --      Peak Flow --      Pain Score 02/04/23 1458 7     Pain Loc --      Pain Edu? --      Excl. in [place]? --    No data found.  Updated Vital Signs BP (!) 139/90   Pulse 94   Temp 99.1 F (37.3 C) (Oral)   Resp 20   SpO2 95%   Visual Acuity Right Eye Distance:   Left Eye Distance:   Bilateral Distance:    Right Eye Near:   Left Eye Near:    Bilateral Near:     Physical Exam Constitutional:      Appearance: Normal appearance. He is well-developed.  HENT:     Head: Normocephalic.     Right Ear: Tympanic membrane, ear canal and external ear normal.     Left Ear: Tympanic membrane, ear canal and external ear normal.     Nose: Congestion and rhinorrhea present.     Mouth/Throat:     Mouth: Mucous membranes are moist.     Pharynx: No posterior oropharyngeal erythema.  Eyes:     Extraocular Movements: Extraocular movements intact.  Cardiovascular:     Rate and Rhythm: Normal rate and regular rhythm.     Pulses: Normal pulses.     Heart sounds: Normal heart sounds.  Pulmonary:     Effort: Pulmonary effort is normal.     Breath sounds: Wheezing present.  Skin:    General: Skin is warm and dry.  Neurological:     Mental Status: He is alert and oriented to person, place, and time. Mental status is at baseline.  Psychiatric:        Mood and Affect: Mood normal.        Behavior: Behavior normal.      UC Treatments / Results  Labs (all labs ordered are listed, but only abnormal results are displayed) Labs Reviewed  RESP PANEL BY RT-PCR (RSV, FLU A&B, COVID)  [id number]    EKG   Radiology No results  found.  Procedures Procedures (including critical care time)  Medications Ordered in UC Medications - No data to display  Initial Impression / Assessment and Plan / UC Course  I have reviewed the triage vital signs and the nursing notes.  Pertinent labs & imaging results that were available during my care of the patient were reviewed by me and considered in my  medical decision making (see chart for details).  Acute upper respiratory infection  Patient is in no signs of distress nor toxic appearing.  Patient, O2 saturation 95% room air, no signs of distress nontoxic, imaging deferred at this time, prescribed amoxicillin and prednisone for treatment, recommended additional over-the-counter medications that may be used for additional supportive care, endorses that he has enough of his rescue medication at home, using albuterol nebulizers. May follow-up with urgent care as needed if symptoms persist or worsen.  Note given.   Final Clinical Impressions(s) / UC Diagnoses   Final diagnoses:  Acute URI     Discharge Instructions      Amoxicillin every morning and every evening for 7 days  COVID flu, RSV negative    You can take Tylenol and/or Ibuprofen as needed for fever reduction and pain relief.   For cough: honey 1/2 to 1 teaspoon (you can dilute the honey in water or another fluid).  You can also use guaifenesin and dextromethorphan for cough. You can use a humidifier for chest congestion and cough.  If you don't have a humidifier, you can sit in the bathroom with the hot shower running.      For sore throat: try warm salt water gargles, cepacol lozenges, throat spray, warm tea or water with lemon/honey, popsicles or ice, or OTC cold relief medicine for throat discomfort.   For congestion: take a daily anti-histamine like Zyrtec, Claritin, and a oral decongestant, such as pseudoephedrine.  You can also use Flonase 1-2 sprays in each nostril daily.   It is important to stay  hydrated: drink plenty of fluids (water, gatorade/powerade/pedialyte, juices, or teas) to keep your throat moisturized and help further relieve irritation/discomfort.    ED Prescriptions   None    PDMP not reviewed this encounter.   Hans [place], [place] 02/04/23 [phone]

## 2023-02-04 NOTE — Discharge Instructions (Addendum)
Amoxicillin every morning and every evening for 7 days  Exam wheezing is heard to your lungs therefore begin prednisone every morning with food for 5 days starting tomorrow to help relax the airway  COVID flu, RSV negative    You can take Tylenol and/or Ibuprofen as needed for fever reduction and pain relief.   For cough: honey 1/2 to 1 teaspoon (you can dilute the honey in water or another fluid).  You can also use guaifenesin and dextromethorphan for cough. You can use a humidifier for chest congestion and cough.  If you don't have a humidifier, you can sit in the bathroom with the hot shower running.      For sore throat: try warm salt water gargles, cepacol lozenges, throat spray, warm tea or water with lemon/honey, popsicles or ice, or OTC cold relief medicine for throat discomfort.   For congestion: take a daily anti-histamine like Zyrtec, Claritin, and a oral decongestant, such as pseudoephedrine.  You can also use Flonase 1-2 sprays in each nostril daily.   It is important to stay hydrated: drink plenty of fluids (water, gatorade/powerade/pedialyte, juices, or teas) to keep your throat moisturized and help further relieve irritation/discomfort.

## 2023-09-10 ENCOUNTER — Observation Stay
Admission: EM | Admit: 2023-09-10 | Discharge: 2023-09-11 | Disposition: A | Discharge status: Home / Self Care | Payer: Medicaid Other | Attending: Internal Medicine | Admitting: Internal Medicine

## 2023-09-10 ENCOUNTER — Other Ambulatory Visit: Payer: Self-pay

## 2023-09-10 ENCOUNTER — Emergency Department: Payer: Medicaid Other

## 2023-09-10 DIAGNOSIS — Z1152 Encounter for screening for COVID-19: Secondary | ICD-10-CM | POA: Insufficient documentation

## 2023-09-10 DIAGNOSIS — R0602 Shortness of breath: Secondary | ICD-10-CM | POA: Diagnosis present

## 2023-09-10 DIAGNOSIS — J4551 Severe persistent asthma with (acute) exacerbation: Secondary | ICD-10-CM | POA: Diagnosis not present

## 2023-09-10 DIAGNOSIS — J45901 Unspecified asthma with (acute) exacerbation: Principal | ICD-10-CM | POA: Diagnosis present

## 2023-09-10 LAB — CBC WITH DIFFERENTIAL/PLATELET
Abs Immature Granulocytes: 0.07 10*3/uL (ref 0.00–0.07)
Basophils Absolute: 0.1 10*3/uL (ref 0.0–0.1)
Basophils Relative: 1 %
Eosinophils Absolute: 0.8 10*3/uL — ABNORMAL HIGH (ref 0.0–0.5)
Eosinophils Relative: 8 %
HCT: 43.5 % (ref 39.0–52.0)
Hemoglobin: 14.4 g/dL (ref 13.0–17.0)
Immature Granulocytes: 1 %
Lymphocytes Relative: 12 %
Lymphs Abs: 1.2 10*3/uL (ref 0.7–4.0)
MCH: 28.7 pg (ref 26.0–34.0)
MCHC: 33.1 g/dL (ref 30.0–36.0)
MCV: 86.8 fL (ref 80.0–100.0)
Monocytes Absolute: 0.3 10*3/uL (ref 0.1–1.0)
Monocytes Relative: 3 %
Neutro Abs: 7.6 10*3/uL (ref 1.7–7.7)
Neutrophils Relative %: 75 %
Platelets: 275 10*3/uL (ref 150–400)
RBC: 5.01 MIL/uL (ref 4.22–5.81)
RDW: 12.8 % (ref 11.5–15.5)
WBC: 10 10*3/uL (ref 4.0–10.5)
nRBC: 0 % (ref 0.0–0.2)

## 2023-09-10 LAB — TROPONIN I (HIGH SENSITIVITY): Troponin I (High Sensitivity): 4 ng/L (ref <18)

## 2023-09-10 LAB — BASIC METABOLIC PANEL
Anion gap: 10 (ref 5–15)
BUN: 12 mg/dL (ref 6–20)
CO2: 27 mmol/L (ref 22–32)
Calcium: 8.8 mg/dL — ABNORMAL LOW (ref 8.9–10.3)
Chloride: 101 mmol/L (ref 98–111)
Creatinine, Ser: 1.36 mg/dL — ABNORMAL HIGH (ref 0.61–1.24)
GFR, Estimated: >60 mL/min (ref >60)
Glucose, Bld: 155 mg/dL — ABNORMAL HIGH (ref 70–99)
Potassium: 3.6 mmol/L (ref 3.5–5.1)
Sodium: 138 mmol/L (ref 135–145)

## 2023-09-10 MED ORDER — IPRATROPIUM-ALBUTEROL 0.5-2.5 (3) MG/3ML IN SOLN
3.0000 mL | Freq: Once | RESPIRATORY_TRACT | Status: AC
  Administered 2023-09-10: 3 mL via RESPIRATORY_TRACT
  Filled 2023-09-10: qty 3

## 2023-09-10 MED ORDER — IPRATROPIUM-ALBUTEROL 0.5-2.5 (3) MG/3ML IN SOLN
3.0000 mL | Freq: Once | RESPIRATORY_TRACT | Status: AC
Start: 2023-09-10 — End: 2023-09-10
  Administered 2023-09-10: 3 mL via RESPIRATORY_TRACT
  Filled 2023-09-10: qty 3

## 2023-09-10 MED ORDER — SODIUM CHLORIDE 0.9 % IV BOLUS
1000.0000 mL | Freq: Once | INTRAVENOUS | Status: AC
  Administered 2023-09-10: 1000 mL via INTRAVENOUS

## 2023-09-10 NOTE — ED Notes (Signed)
Taking over care of pt at this time
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
## 2023-09-11 DIAGNOSIS — J45901 Unspecified asthma with (acute) exacerbation: Secondary | ICD-10-CM | POA: Diagnosis present

## 2023-09-11 LAB — TROPONIN I (HIGH SENSITIVITY): Troponin I (High Sensitivity): 4 ng/L (ref <18)

## 2023-09-11 LAB — RESP PANEL BY RT-PCR (RSV, FLU A&B, COVID)  RVPGX2
Influenza A by PCR: NEGATIVE
Influenza B by PCR: NEGATIVE
Resp Syncytial Virus by PCR: NEGATIVE
SARS Coronavirus 2 by RT PCR: NEGATIVE

## 2023-09-11 LAB — HIV ANTIBODY (ROUTINE TESTING W REFLEX): HIV Screen 4th Generation wRfx: NONREACTIVE

## 2023-09-11 MED ORDER — ACETAMINOPHEN 650 MG RE SUPP: 650.0000 mg | Freq: Four times a day (QID) | RECTAL | Status: DC | PRN

## 2023-09-11 MED ORDER — FLUTICASONE-SALMETEROL 500-50 MCG/DOSE IN AEPB: 1.0000 | INHALATION_SPRAY | Freq: Two times a day (BID) | RESPIRATORY_TRACT | 2 refills | Status: DC

## 2023-09-11 MED ORDER — ONDANSETRON HCL 4 MG/2ML IJ SOLN: 4.0000 mg | Freq: Four times a day (QID) | INTRAMUSCULAR | Status: DC | PRN

## 2023-09-11 MED ORDER — ENOXAPARIN SODIUM 40 MG/0.4ML IJ SOSY: 40.0000 mg | PREFILLED_SYRINGE | INTRAMUSCULAR | Status: DC

## 2023-09-11 MED ORDER — IPRATROPIUM-ALBUTEROL 0.5-2.5 (3) MG/3ML IN SOLN
3.0000 mL | Freq: Once | RESPIRATORY_TRACT | Status: AC
  Administered 2023-09-11: 3 mL via RESPIRATORY_TRACT
  Filled 2023-09-11: qty 3

## 2023-09-11 MED ORDER — METHYLPREDNISOLONE SODIUM SUCC 40 MG IJ SOLR
40.0000 mg | Freq: Two times a day (BID) | INTRAMUSCULAR | Status: DC
  Filled 2023-09-11: qty 1

## 2023-09-11 MED ORDER — IPRATROPIUM-ALBUTEROL 0.5-2.5 (3) MG/3ML IN SOLN: 3.0000 mL | Freq: Two times a day (BID) | RESPIRATORY_TRACT | Status: DC

## 2023-09-11 MED ORDER — DEXAMETHASONE SODIUM PHOSPHATE 10 MG/ML IJ SOLN: 10.0000 mg | Freq: Once | INTRAMUSCULAR | Status: DC

## 2023-09-11 MED ORDER — ALBUTEROL SULFATE (2.5 MG/3ML) 0.083% IN NEBU: 2.5000 mg | INHALATION_SOLUTION | RESPIRATORY_TRACT | Status: DC | PRN

## 2023-09-11 MED ORDER — ACETAMINOPHEN 325 MG PO TABS: 650.0000 mg | ORAL_TABLET | Freq: Four times a day (QID) | ORAL | Status: DC | PRN

## 2023-09-11 MED ORDER — PREDNISONE 20 MG PO TABS: 40.0000 mg | ORAL_TABLET | Freq: Every day | ORAL | 0 refills | Status: DC

## 2023-09-11 MED ORDER — ONDANSETRON HCL 4 MG PO TABS: 4.0000 mg | ORAL_TABLET | Freq: Four times a day (QID) | ORAL | Status: DC | PRN

## 2023-09-11 MED ORDER — IPRATROPIUM-ALBUTEROL 0.5-2.5 (3) MG/3ML IN SOLN
3.0000 mL | Freq: Four times a day (QID) | RESPIRATORY_TRACT | Status: DC
  Administered 2023-09-11: 3 mL via RESPIRATORY_TRACT
  Filled 2023-09-11: qty 3

## 2023-09-11 MED ORDER — PREDNISONE 20 MG PO TABS: 40.0000 mg | ORAL_TABLET | Freq: Every day | ORAL | Status: DC

## 2023-09-11 NOTE — Plan of Care (Signed)
  Problem: Education: Goal: Knowledge of disease or condition will improve Outcome: Adequate for Discharge Goal: Knowledge of the prescribed therapeutic regimen will improve Outcome: Adequate for Discharge Goal: Individualized Educational Video(s) Outcome: Adequate for Discharge   Problem: Activity: Goal: Ability to tolerate increased activity will improve Outcome: Adequate for Discharge Goal: Will verbalize the importance of balancing activity with adequate rest periods Outcome: Adequate for Discharge   Problem: Respiratory: Goal: Ability to maintain a clear airway will improve Outcome: Adequate for Discharge Goal: Levels of oxygenation will improve Outcome: Adequate for Discharge Goal: Ability to maintain adequate ventilation will improve Outcome: Adequate for Discharge   Problem: Education: Goal: Knowledge of General Education information will improve Description: Including pain rating scale, medication(s)/side effects and non-pharmacologic comfort measures Outcome: Adequate for Discharge   Problem: Health Behavior/Discharge Planning: Goal: Ability to manage health-related needs will improve Outcome: Adequate for Discharge   Problem: Clinical Measurements: Goal: Ability to maintain clinical measurements within normal limits will improve Outcome: Adequate for Discharge Goal: Will remain free from infection Outcome: Adequate for Discharge Goal: Diagnostic test results will improve Outcome: Adequate for Discharge Goal: Respiratory complications will improve Outcome: Adequate for Discharge Goal: Cardiovascular complication will be avoided Outcome: Adequate for Discharge   Problem: Activity: Goal: Risk for activity intolerance will decrease Outcome: Adequate for Discharge   Problem: Nutrition: Goal: Adequate nutrition will be maintained Outcome: Adequate for Discharge   Problem: Coping: Goal: Level of anxiety will decrease Outcome: Adequate for Discharge    Problem: Elimination: Goal: Will not experience complications related to bowel motility Outcome: Adequate for Discharge Goal: Will not experience complications related to urinary retention Outcome: Adequate for Discharge   Problem: Pain Management: Goal: General experience of comfort will improve Outcome: Adequate for Discharge   Problem: Safety: Goal: Ability to remain free from injury will improve Outcome: Adequate for Discharge   Problem: Skin Integrity: Goal: Risk for impaired skin integrity will decrease Outcome: Adequate for Discharge

## 2023-09-11 NOTE — Plan of Care (Signed)
  Problem: Education: Goal: Knowledge of disease or condition will improve Outcome: Progressing Goal: Knowledge of the prescribed therapeutic regimen will improve Outcome: Progressing Goal: Individualized Educational Video(s) Outcome: Progressing   Problem: Activity: Goal: Ability to tolerate increased activity will improve Outcome: Progressing Goal: Will verbalize the importance of balancing activity with adequate rest periods Outcome: Progressing   Problem: Respiratory: Goal: Ability to maintain a clear airway will improve Outcome: Progressing Goal: Levels of oxygenation will improve Outcome: Progressing Goal: Ability to maintain adequate ventilation will improve Outcome: Progressing   Problem: Education: Goal: Knowledge of General Education information will improve Description: Including pain rating scale, medication(s)/side effects and non-pharmacologic comfort measures Outcome: Progressing   Problem: Health Behavior/Discharge Planning: Goal: Ability to manage health-related needs will improve Outcome: Progressing   Problem: Clinical Measurements: Goal: Ability to maintain clinical measurements within normal limits will improve Outcome: Progressing Goal: Will remain free from infection Outcome: Progressing Goal: Diagnostic test results will improve Outcome: Progressing Goal: Respiratory complications will improve Outcome: Progressing Goal: Cardiovascular complication will be avoided Outcome: Progressing   Problem: Activity: Goal: Risk for activity intolerance will decrease Outcome: Progressing   Problem: Nutrition: Goal: Adequate nutrition will be maintained Outcome: Progressing   Problem: Coping: Goal: Level of anxiety will decrease Outcome: Progressing   Problem: Elimination: Goal: Will not experience complications related to bowel motility Outcome: Progressing Goal: Will not experience complications related to urinary retention Outcome: Progressing    Problem: Pain Management: Goal: General experience of comfort will improve Outcome: Progressing   Problem: Safety: Goal: Ability to remain free from injury will improve Outcome: Progressing   Problem: Skin Integrity: Goal: Risk for impaired skin integrity will decrease Outcome: Progressing
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
## 2023-09-11 NOTE — ED Provider Notes (Signed)
  Physical Exam  BP 139/86   Pulse (!) 119   Temp 98.3 F (36.8 C)   Resp 18   SpO2 95%   Physical Exam I have reviewed the vital signs. General:  Awake, alert, no acute distress. Head:  Normocephalic, Atraumatic. EENT:  PERRL, EOMI, Oral mucosa pink and moist, Neck is supple. Cardiovascular: Regular rate, 2+ distal pulses. Respiratory:  Normal respiratory effort, symmetrical expansion, no distress.  Slight tachypnea and still having some and expiratory wheezing. Extremities:  Moving all four extremities through full ROM without pain.   Neuro:  Alert and oriented.  Interacting appropriately.   Skin:  Warm, dry, no rash.   Psych: Appropriate affect.   Procedures  Procedures  ED Course / MDM   Clinical Course as of 09/11/23 0049  Fri Sep 10, 2023  2324 X-ray my review and interpretation without evidence of pneumothorax or consolidation.  Patient reassessed.  He is improving but still with diffuse wheezing has required multiple admissions in the past.  I do think he is can need some additional observation time to make appropriate disposition and will be signed out oncoming physician for reassessment. [PR]  Sat Sep 11, 2023  4132 Still having ongoing wheezing with slight tachypnea.  Will give additional breathing treatment admit to hospitalist at this time for asthma exacerbation [DW]    Clinical Course User Index [DW] Janith Lima, MD [PR] Willy Eddy, MD   Medical Decision Making Amount and/or Complexity of Data Reviewed Labs: ordered. Radiology: ordered.  Risk Prescription drug management.   Patient is a 27 year old male who presented today for acute shortness of breath.  Had an acute stressor due to a death of a family member earlier today and started experiencing an asthma exacerbation.  Was given DuoNebs and Solu-Medrol en route with EMS.  Unable to speak initially.  Had improvements with DuoNeb here.  Signed out pending reassessment.  Patient reassessed and  still having some and expiratory wheezing on exam.  Not completely back to baseline at this time.  Will give additional DuoNeb and admit to hospitalist for further treatment of asthma exacerbation.    Janith Lima, MD 09/11/23 [phone]

## 2023-09-13 ENCOUNTER — Other Ambulatory Visit (HOSPITAL_COMMUNITY): Payer: Self-pay

## 2023-09-16 ENCOUNTER — Emergency Department: Payer: Medicaid Other

## 2023-09-16 ENCOUNTER — Other Ambulatory Visit: Payer: Self-pay

## 2023-09-16 ENCOUNTER — Inpatient Hospital Stay
Admission: EM | Admit: 2023-09-16 | Discharge: 2023-09-17 | DRG: 203 | Disposition: A | Discharge status: Home / Self Care | Payer: Medicaid Other | Attending: Internal Medicine | Admitting: Internal Medicine

## 2023-09-16 DIAGNOSIS — R0682 Tachypnea, not elsewhere classified: Secondary | ICD-10-CM | POA: Diagnosis present

## 2023-09-16 DIAGNOSIS — R0603 Acute respiratory distress: Secondary | ICD-10-CM | POA: Diagnosis present

## 2023-09-16 DIAGNOSIS — R000 Tachycardia, unspecified: Secondary | ICD-10-CM | POA: Diagnosis present

## 2023-09-16 DIAGNOSIS — J45909 Unspecified asthma, uncomplicated: Secondary | ICD-10-CM | POA: Diagnosis present

## 2023-09-16 DIAGNOSIS — J4551 Severe persistent asthma with (acute) exacerbation: Secondary | ICD-10-CM | POA: Diagnosis not present

## 2023-09-16 DIAGNOSIS — J4542 Moderate persistent asthma with status asthmaticus: Secondary | ICD-10-CM | POA: Diagnosis present

## 2023-09-16 DIAGNOSIS — Z7951 Long term (current) use of inhaled steroids: Secondary | ICD-10-CM

## 2023-09-16 DIAGNOSIS — J4541 Moderate persistent asthma with (acute) exacerbation: Principal | ICD-10-CM

## 2023-09-16 DIAGNOSIS — J45901 Unspecified asthma with (acute) exacerbation: Secondary | ICD-10-CM | POA: Diagnosis present

## 2023-09-16 DIAGNOSIS — J4550 Severe persistent asthma, uncomplicated: Secondary | ICD-10-CM

## 2023-09-16 LAB — BASIC METABOLIC PANEL
Anion gap: 10 (ref 5–15)
BUN: 15 mg/dL (ref 6–20)
CO2: 26 mmol/L (ref 22–32)
Calcium: 8.6 mg/dL — ABNORMAL LOW (ref 8.9–10.3)
Chloride: 103 mmol/L (ref 98–111)
Creatinine, Ser: 1.14 mg/dL (ref 0.61–1.24)
GFR, Estimated: >60 mL/min (ref >60)
Glucose, Bld: 117 mg/dL — ABNORMAL HIGH (ref 70–99)
Potassium: 3.8 mmol/L (ref 3.5–5.1)
Sodium: 139 mmol/L (ref 135–145)

## 2023-09-16 LAB — BLOOD GAS, VENOUS
Acid-Base Excess: 0.1 mmol/L (ref 0.0–2.0)
Bicarbonate: 24.7 mmol/L (ref 20.0–28.0)
O2 Saturation: 76 %
Patient temperature: 37
pCO2, Ven: 39 mm[Hg] — ABNORMAL LOW (ref 44–60)
pH, Ven: 7.41 (ref 7.25–7.43)
pO2, Ven: 44 mm[Hg] (ref 32–45)

## 2023-09-16 LAB — CBC
HCT: 46 % (ref 39.0–52.0)
Hemoglobin: 15.5 g/dL (ref 13.0–17.0)
MCH: 28.2 pg (ref 26.0–34.0)
MCHC: 33.7 g/dL (ref 30.0–36.0)
MCV: 83.8 fL (ref 80.0–100.0)
Platelets: 293 10*3/uL (ref 150–400)
RBC: 5.49 MIL/uL (ref 4.22–5.81)
RDW: 12.3 % (ref 11.5–15.5)
WBC: 10.4 10*3/uL (ref 4.0–10.5)
nRBC: 0 % (ref 0.0–0.2)

## 2023-09-16 LAB — TROPONIN I (HIGH SENSITIVITY): Troponin I (High Sensitivity): 4 ng/L (ref <18)

## 2023-09-16 LAB — MAGNESIUM: Magnesium: 2.4 mg/dL (ref 1.7–2.4)

## 2023-09-16 MED ORDER — IPRATROPIUM-ALBUTEROL 0.5-2.5 (3) MG/3ML IN SOLN
3.0000 mL | Freq: Once | RESPIRATORY_TRACT | Status: AC
  Administered 2023-09-16: 3 mL via RESPIRATORY_TRACT
  Filled 2023-09-16: qty 3

## 2023-09-16 MED ORDER — ONDANSETRON HCL 4 MG/2ML IJ SOLN: 4.0000 mg | Freq: Four times a day (QID) | INTRAMUSCULAR | Status: DC | PRN

## 2023-09-16 MED ORDER — METHYLPREDNISOLONE SODIUM SUCC 40 MG IJ SOLR
40.0000 mg | Freq: Four times a day (QID) | INTRAMUSCULAR | Status: DC
  Administered 2023-09-16 – 2023-09-17 (×4): 40 mg via INTRAVENOUS
  Filled 2023-09-16 (×4): qty 1

## 2023-09-16 MED ORDER — LORAZEPAM 2 MG/ML IJ SOLN: 0.5000 mg | Freq: Once | INTRAMUSCULAR | Status: DC

## 2023-09-16 MED ORDER — MAGNESIUM SULFATE 2 GM/50ML IV SOLN
2.0000 g | Freq: Once | INTRAVENOUS | Status: AC
  Administered 2023-09-16: 2 g via INTRAVENOUS
  Filled 2023-09-16: qty 50

## 2023-09-16 MED ORDER — METHYLPREDNISOLONE SODIUM SUCC 125 MG IJ SOLR
125.0000 mg | Freq: Once | INTRAMUSCULAR | Status: AC
Start: 2023-09-16 — End: 2023-09-16
  Administered 2023-09-16: 125 mg via INTRAVENOUS
  Filled 2023-09-16: qty 2

## 2023-09-16 MED ORDER — ENOXAPARIN SODIUM 40 MG/0.4ML IJ SOSY: 40.0000 mg | PREFILLED_SYRINGE | INTRAMUSCULAR | Status: DC

## 2023-09-16 MED ORDER — BUDESONIDE 0.5 MG/2ML IN SUSP
0.5000 mg | Freq: Two times a day (BID) | RESPIRATORY_TRACT | Status: DC
  Administered 2023-09-16 (×2): 0.5 mg via RESPIRATORY_TRACT
  Filled 2023-09-16 (×2): qty 2

## 2023-09-16 MED ORDER — ALBUTEROL SULFATE (2.5 MG/3ML) 0.083% IN NEBU: 3.0000 mL | INHALATION_SOLUTION | Freq: Four times a day (QID) | RESPIRATORY_TRACT | Status: DC | PRN

## 2023-09-16 MED ORDER — ONDANSETRON HCL 4 MG PO TABS: 4.0000 mg | ORAL_TABLET | Freq: Four times a day (QID) | ORAL | Status: DC | PRN

## 2023-09-16 MED ORDER — MOMETASONE FURO-FORMOTEROL FUM 200-5 MCG/ACT IN AERO
2.0000 | INHALATION_SPRAY | Freq: Two times a day (BID) | RESPIRATORY_TRACT | Status: DC
  Filled 2023-09-16 (×2): qty 8.8

## 2023-09-16 MED ORDER — IPRATROPIUM-ALBUTEROL 0.5-2.5 (3) MG/3ML IN SOLN
3.0000 mL | RESPIRATORY_TRACT | Status: DC
  Administered 2023-09-16 – 2023-09-17 (×5): 3 mL via RESPIRATORY_TRACT
  Filled 2023-09-16 (×5): qty 3

## 2023-09-16 MED ORDER — ACETAMINOPHEN 325 MG PO TABS: 650.0000 mg | ORAL_TABLET | Freq: Four times a day (QID) | ORAL | Status: DC | PRN

## 2023-09-16 NOTE — ED Triage Notes (Addendum)
Pt reports sob, that started a few hours ago, pt has hx asthma and reports "I only have 1 lung". Pt has audible wheezing in triage, currently using inhaler he brought from home. Pt states he also has chest pain

## 2023-09-16 NOTE — ED Notes (Addendum)
Pt took bipap off himself and asked for nasal cannula/ this nurse made Dr. Dolores Frame aware and Dr. Dolores Frame explained to pt that it is important for pt to wear the mask/ pt agreed to wear bipap after neb tx/   pt is now wearing Bipap after receiving neb tx.

## 2023-09-16 NOTE — ED Notes (Signed)
Pt weaned off the BiPAP at this time by RT

## 2023-09-16 NOTE — ED Notes (Signed)
Pt noted to be more tachycardic after neb tx

## 2023-09-16 NOTE — ED Notes (Signed)
MD made aware pt HR increasing from 100s to maintaining HR in 110s-120s.

## 2023-09-16 NOTE — ED Notes (Signed)
Notified Nurse Whitney Post of pt HR being 113

## 2023-09-16 NOTE — H&P (Signed)
History and Physical    John Schwartz QMV:[id number] DOB: 01-Sep-1996 DOA: 09/16/2023  PCP: Pcp, No (Confirm with patient/family/NH records and if not entered, this has to be entered at [facility] point of entry) Patient coming from: Home  I have personally briefly reviewed patient's old medical records in [facility]  Chief Complaint: Wheezing, SOB  HPI: John Schwartz is a 27 y.o. male with medical history significant of severe asthma, presented with worsening of cough wheezing shortness of breath.  Patient was recently hospitalized for asthma flareup and discharged home with tapering dose of prednisone.  Yesterday evening, patient started to have wheezing shortness of breath and soon became severe and decided to come back to the ED.  Denies any cough, no fever chills no chest pains.  ED Course: Afebrile, tachycardia, tachypneic and oxygen saturation 92% on room air.  Chest x-ray showed lungs are clear.  Patient was started on BiPAP to relieve breathing effort, blood work showed bicarb 26, creatinine 1.1, WBC 10.4.  Review of Systems: As per HPI otherwise 14 point review of systems negative.    Past Medical History:  Diagnosis Date   Asthma    Bronchospasm     Past Surgical History:  Procedure Laterality Date   NO PAST SURGERIES       reports that he has never smoked. He has never used smokeless tobacco. He reports that he does not currently use drugs after having used the following drugs: Marijuana. He reports that he does not drink alcohol.  No Known Allergies  Family History  Problem Relation Age of Onset   Arthritis Paternal Grandmother      Prior to Admission medications   Medication Sig Start Date End Date Taking? Authorizing Provider  Fluticasone-Salmeterol (ADVAIR) 500-50 MCG/DOSE AEPB Inhale 1 puff into the lungs 2 (two) times daily. 09/11/23 12/10/23 Yes Djan, Scarlette Calico, MD  ipratropium-albuterol (DUONEB) 0.5-2.5 (3) MG/3ML SOLN Take 3 mLs by nebulization every  6 (six) hours as needed. Up to 4 times daily if needed for wheezing or shortness of breath. Patient taking differently: Take 3 mLs by nebulization 4 (four) times daily as needed (wheezing). Up to 4 times daily if needed for wheezing or shortness of breath. 11/19/21  Yes Darlin Priestly, MD  predniSONE (DELTASONE) 20 MG tablet Take 2 tablets (40 mg total) by mouth daily with breakfast for 5 days. 09/12/23 09/17/23 Yes Loyce Dys, MD  acetaminophen (TYLENOL) 325 MG tablet Take 2 tablets (650 mg total) by mouth every 6 (six) hours as needed for mild pain (or Fever >/= 101). 05/27/19   Altamese Dilling, MD  albuterol (VENTOLIN HFA) 108 (90 Base) MCG/ACT inhaler Inhale 2 puffs into the lungs every 6 (six) hours as needed for wheezing or shortness of breath. 11/11/21   Tommi Rumps, PA-C    Physical Exam: Vitals:   09/16/23 0300 09/16/23 0500 09/16/23 0600 09/16/23 0700  BP: (!) 139/93 (!) 139/104 (!) 136/93 138/78  Pulse: (!) 119 (!) 103 93 95  Resp: 13 18 17 15   Temp:      TempSrc:      SpO2: 98% 97% 97% 98%  Weight:      Height:        Constitutional: NAD, calm, comfortable Vitals:   09/16/23 0300 09/16/23 0500 09/16/23 0600 09/16/23 0700  BP: (!) 139/93 (!) 139/104 (!) 136/93 138/78  Pulse: (!) 119 (!) 103 93 95  Resp: 13 18 17 15   Temp:      TempSrc:  SpO2: 98% 97% 97% 98%  Weight:      Height:       Eyes: PERRL, lids and conjunctivae normal ENMT: Mucous membranes are moist. Posterior pharynx clear of any exudate or lesions.Normal dentition.  Neck: normal, supple, no masses, no thyromegaly Respiratory: Diminished breathing sound bilaterally, diffused wheezing no crackles, increasing breathing effort, positive signs of accessory muscle use.  Cardiovascular: Regular rate and rhythm, no murmurs / rubs / gallops. No extremity edema. 2+ pedal pulses. No carotid bruits.  Abdomen: no tenderness, no masses palpated. No hepatosplenomegaly. Bowel sounds positive.  Musculoskeletal:  no clubbing / cyanosis. No joint deformity upper and lower extremities. Good ROM, no contractures. Normal muscle tone.  Skin: no rashes, lesions, ulcers. No induration Neurologic: CN 2-12 grossly intact. Sensation intact, DTR normal. Strength 5/5 in all 4.  Psychiatric: Normal judgment and insight. Alert and oriented x 3. Normal mood.     Labs on Admission: I have personally reviewed following labs and imaging studies  CBC: Recent Labs  Lab 09/10/23 2216 09/16/23 0117  WBC 10.0 10.4  NEUTROABS 7.6  --   HGB 14.4 15.5  HCT 43.5 46.0  MCV 86.8 83.8  PLT 275 293   Basic Metabolic Panel: Recent Labs  Lab 09/10/23 2216 09/16/23 0117  NA 138 139  K 3.6 3.8  CL 101 103  CO2 27 26  GLUCOSE 155* 117*  BUN 12 15  CREATININE 1.36* 1.14  CALCIUM 8.8* 8.6*  MG  --  2.4   GFR: Estimated Creatinine Clearance: 109.7 mL/min (by C-G formula based on SCr of 1.14 mg/dL). Liver Function Tests: No results for input(s): "AST", "ALT", "ALKPHOS", "BILITOT", "PROT", "ALBUMIN" in the last 168 hours. No results for input(s): "LIPASE", "AMYLASE" in the last 168 hours. No results for input(s): "AMMONIA" in the last 168 hours. Coagulation Profile: No results for input(s): "INR", "PROTIME" in the last 168 hours. Cardiac Enzymes: No results for input(s): "CKTOTAL", "CKMB", "CKMBINDEX", "TROPONINI" in the last 168 hours. BNP (last 3 results) No results for input(s): "PROBNP" in the last 8760 hours. HbA1C: No results for input(s): "HGBA1C" in the last 72 hours. CBG: No results for input(s): "GLUCAP" in the last 168 hours. Lipid Profile: No results for input(s): "CHOL", "HDL", "LDLCALC", "TRIG", "CHOLHDL", "LDLDIRECT" in the last 72 hours. Thyroid Function Tests: No results for input(s): "TSH", "T4TOTAL", "FREET4", "T3FREE", "THYROIDAB" in the last 72 hours. Anemia Panel: No results for input(s): "VITAMINB12", "FOLATE", "FERRITIN", "TIBC", "IRON", "RETICCTPCT" in the last 72 hours. Urine  analysis: No results found for: "COLORURINE", "APPEARANCEUR", "LABSPEC", "PHURINE", "GLUCOSEU", "HGBUR", "BILIRUBINUR", "KETONESUR", "PROTEINUR", "UROBILINOGEN", "NITRITE", "LEUKOCYTESUR"  Radiological Exams on Admission: DG Chest Port 1 View  Result Date: 09/16/2023 CLINICAL DATA:  Shortness of breath EXAM: PORTABLE CHEST 1 VIEW COMPARISON:  09/10/2023 FINDINGS: The heart size and mediastinal contours are within normal limits. Both lungs are clear. The visualized skeletal structures are unremarkable. IMPRESSION: No active disease. Electronically Signed   By: Jasmine Pang M.D.   On: 09/16/2023 03:52    EKG: Independently reviewed.  Sinus tachycardia, no acute ST changes.  Assessment/Plan Principal Problem:   Acute asthma exacerbation Active Problems:   Asthma  (please populate well all problems here in Problem List. (For example, if patient is on BP meds at home and you resume or decide to hold them, it is a problem that needs to be her. Same for CAD, COPD, HLD and so on)  Status asthmaticus -Continue BiPAP, admitted to PCU to wean off BiPAP -Around-the-clock IV  Solu-Medrol 40 mg every 6 hours -Pulmicort -LABA, DuoNebs every 6 hours and as needed albuterol -Peak flow -VBG to rule out CO2 retention  DVT prophylaxis: Lovenox Code Status: Full code Family Communication: Grandmother at bedside Disposition Plan: Patient is sick with severe asthma exacerbation, requiring BiPAP and IV steroid, expect more than 2 midnight hospital stay Consults called: None Admission status: PCU admit   Emeline General MD [facility] Pager [phone]  09/16/2023, 9:38 AM

## 2023-09-16 NOTE — ED Notes (Signed)
Rainbow sent to the lab at this time.  

## 2023-09-16 NOTE — ED Provider Notes (Signed)
[facility] Provider Note    Event Date/Time   First MD Initiated Contact with Patient 09/16/23 0121     (approximate)   History   Shortness of Breath   HPI  John Schwartz is a 27 y.o. male  who returns to the ED from home with shortness of breath. Patient just hospitalized 10/25-10/26/2024 for asthma exacerbation. Endorses wheezing, chest pain and difficulty breathing. Denies fever/chills, abdominal pain, N/V, or dizziness.      Past Medical History   Past Medical History:  Diagnosis Date   Asthma    Bronchospasm      Active Problem List   Patient Active Problem List   Diagnosis Date Noted   Asthma with severe exacerbation 09/11/2023   Contusion of left elbow 12/16/2021   Asthma exacerbation 11/18/2021   Acute severe exacerbation of asthma 05/14/2020   Asthma with status asthmaticus 06/04/2016   Acute hypokalemia 06/04/2016   Noncompliance with medication treatment due to underuse of medication 06/04/2016   Acute renal insufficiency 06/04/2016   Acute asthma exacerbation 06/04/2016   Dyspnea 03/16/2016   Acute exacerbation of severe persistent extrinsic asthma 03/16/2016   Severe persistent asthma 03/12/2014   Status asthmaticus 02/11/2014   Acute respiratory failure (HCC) 02/11/2014     Past Surgical History   Past Surgical History:  Procedure Laterality Date   NO PAST SURGERIES       Home Medications   Prior to Admission medications   Medication Sig Start Date End Date Taking? Authorizing Provider  Fluticasone-Salmeterol (ADVAIR) 500-50 MCG/DOSE AEPB Inhale 1 puff into the lungs 2 (two) times daily. 09/11/23 12/10/23 Yes Djan, Scarlette Calico, MD  ipratropium-albuterol (DUONEB) 0.5-2.5 (3) MG/3ML SOLN Take 3 mLs by nebulization every 6 (six) hours as needed. Up to 4 times daily if needed for wheezing or shortness of breath. Patient taking differently: Take 3 mLs by nebulization 4 (four) times daily as needed (wheezing). Up to 4  times daily if needed for wheezing or shortness of breath. 11/19/21  Yes Darlin Priestly, MD  predniSONE (DELTASONE) 20 MG tablet Take 2 tablets (40 mg total) by mouth daily with breakfast for 5 days. 09/12/23 09/17/23 Yes Loyce Dys, MD  acetaminophen (TYLENOL) 325 MG tablet Take 2 tablets (650 mg total) by mouth every 6 (six) hours as needed for mild pain (or Fever >/= 101). 05/27/19   Altamese Dilling, MD  albuterol (VENTOLIN HFA) 108 (90 Base) MCG/ACT inhaler Inhale 2 puffs into the lungs every 6 (six) hours as needed for wheezing or shortness of breath. 11/11/21   Tommi Rumps, PA-C     Allergies  Patient has no known allergies.   Family History   Family History  Problem Relation Age of Onset   Arthritis Paternal Grandmother      Physical Exam  Triage Vital Signs: ED Triage Vitals  Encounter Vitals Group     BP 09/16/23 0115 (!) 155/110     Systolic BP Percentile --      Diastolic BP Percentile --      Pulse Rate 09/16/23 0115 (!) 126     Resp 09/16/23 0115 18     Temp 09/16/23 0115 98.2 F (36.8 C)     Temp Source 09/16/23 0115 Oral     SpO2 09/16/23 0115 93 %     Weight 09/16/23 0115 197 lb 12 oz (89.7 kg)     Height 09/16/23 0115 5\' 10"  (1.778 m)     Head Circumference --  Peak Flow --      Pain Score 09/16/23 0114 8     Pain Loc --      Pain Education --      Exclude from Growth Chart --     Updated Vital Signs: BP (!) 139/93   Pulse (!) 119   Temp 98.2 F (36.8 C) (Oral)   Resp 13   Ht 5\' 10"  (1.778 m)   Wt 89.7 kg   SpO2 98%   BMI 28.37 kg/m    General: Awake, moderate distress.  CV:  Tachycardic. Good peripheral perfusion.  Resp:  Increased effort. Tripoding. Audible wheezing diffusely. Abd:  Nontender. No distention.  Other:  No pedal edema.   ED Results / Procedures / Treatments  Labs (all labs ordered are listed, but only abnormal results are displayed) Labs Reviewed  BASIC METABOLIC PANEL - Abnormal; Notable for the following  components:      Result Value   Glucose, Bld 117 (*)    Calcium 8.6 (*)    All other components within normal limits  CBC  MAGNESIUM  TROPONIN I (HIGH SENSITIVITY)  TROPONIN I (HIGH SENSITIVITY)     EKG  ED ECG REPORT I, Siren Porrata J, the attending physician, personally viewed and interpreted this ECG.   Date: 09/16/2023  EKG Time: 0117  Rate: 129  Rhythm: sinus tachycardia  Axis: RAD  Intervals:none  ST&T Change: Nonspecific    RADIOLOGY I have independently visualized and interpreted patient's imaging study as well as noted the radiology interpretation:  CXR: No acute cardiopulmonary process  Official radiology report(s): DG Chest Port 1 View  Result Date: 09/16/2023 CLINICAL DATA:  Shortness of breath EXAM: PORTABLE CHEST 1 VIEW COMPARISON:  09/10/2023 FINDINGS: The heart size and mediastinal contours are within normal limits. Both lungs are clear. The visualized skeletal structures are unremarkable. IMPRESSION: No active disease. Electronically Signed   By: Jasmine Pang M.D.   On: 09/16/2023 03:52     PROCEDURES:  Critical Care performed: Yes, see critical care procedure note(s)  CRITICAL CARE Performed by: Irean Hong   Total critical care time: 30 minutes  Critical care time was exclusive of separately billable procedures and treating other patients.  Critical care was necessary to treat or prevent imminent or life-threatening deterioration.  Critical care was time spent personally by me on the following activities: development of treatment plan with patient and/or surrogate as well as nursing, discussions with consultants, evaluation of patient's response to treatment, examination of patient, obtaining history from patient or surrogate, ordering and performing treatments and interventions, ordering and review of laboratory studies, ordering and review of radiographic studies, pulse oximetry and re-evaluation of patient's condition.   Marland Kitchen1-3 Lead EKG  Interpretation  Performed by: Irean Hong, MD Authorized by: Irean Hong, MD     Interpretation: abnormal     ECG rate:  125   ECG rate assessment: tachycardic     Rhythm: sinus tachycardia     Ectopy: none     Conduction: normal   Comments:     Placed on cardiac monitor to evaluate for arrthymias    MEDICATIONS ORDERED IN ED: Medications  LORazepam (ATIVAN) injection 0.5 mg (0 mg Intravenous Hold 09/16/23 0157)  methylPREDNISolone sodium succinate (SOLU-MEDROL) 125 mg/2 mL injection 125 mg (125 mg Intravenous Given 09/16/23 0144)  ipratropium-albuterol (DUONEB) 0.5-2.5 (3) MG/3ML nebulizer solution 3 mL (3 mLs Nebulization Given 09/16/23 0141)  magnesium sulfate IVPB 2 g 50 mL (2 g Intravenous New Bag/Given 09/16/23  0155)  ipratropium-albuterol (DUONEB) 0.5-2.5 (3) MG/3ML nebulizer solution 3 mL (3 mLs Nebulization Given 09/16/23 0159)  ipratropium-albuterol (DUONEB) 0.5-2.5 (3) MG/3ML nebulizer solution 3 mL (3 mLs Nebulization Given 09/16/23 0159)     IMPRESSION / MDM / ASSESSMENT AND PLAN / ED COURSE  I reviewed the triage vital signs and the nursing notes.                             27 year old male returns for respiratory distress. Differential includes, but is not limited to, viral syndrome, bronchitis including COPD exacerbation, pneumonia, reactive airway disease including asthma, CHF including exacerbation with or without pulmonary/interstitial edema, pneumothorax, ACS, thoracic trauma, and pulmonary embolism. I have personally reviewed patient's records and note his recent hospitalization 10/25-10/26/2024 for asthma exacerbation.  Patient's presentation is most consistent with acute presentation with potential threat to life or bodily function.  The patient is on the cardiac monitor to evaluate for evidence of arrhythmia and/or significant heart rate changes.  Obtain cardiac panel, cxr. Start 125mg  IV Solumedrol, duoneb, 2g Magnesium, BiPAP. Anticipate  hospitalization.  Clinical Course as of 09/16/23 9604  Thu Sep 16, 2023  0232 Appears more comfortable on BiPAP. Laboratory and imaging results unremarkable. Will consult hospitalist service for evaluation and admission. [JS]    Clinical Course User Index [JS] Irean Hong, MD     FINAL CLINICAL IMPRESSION(S) / ED DIAGNOSES   Final diagnoses:  Moderate persistent asthma with exacerbation  Respiratory distress     Rx / DC Orders   ED Discharge Orders     None        Note:  This document was prepared using Dragon voice recognition software and may include unintentional dictation errors.   Irean Hong, MD 09/16/23 [phone]

## 2023-09-17 ENCOUNTER — Encounter: Payer: Self-pay | Admitting: Internal Medicine

## 2023-09-17 DIAGNOSIS — J4551 Severe persistent asthma with (acute) exacerbation: Secondary | ICD-10-CM | POA: Diagnosis not present

## 2023-09-17 MED ORDER — ALBUTEROL SULFATE HFA 108 (90 BASE) MCG/ACT IN AERS: 2.0000 | INHALATION_SPRAY | Freq: Four times a day (QID) | RESPIRATORY_TRACT | 2 refills | Status: DC | PRN

## 2023-09-17 MED ORDER — PREDNISONE 20 MG PO TABS: 40.0000 mg | ORAL_TABLET | Freq: Every day | ORAL | 0 refills | Status: AC

## 2023-09-17 NOTE — Discharge Summary (Signed)
Physician Discharge Summary  John Schwartz ZOX:[id number] DOB: 1995-11-28 DOA: 09/16/2023  PCP: Pcp, No  Admit date: 09/16/2023 Discharge date: 09/17/2023  Admitted From: Home Disposition:  Home  Recommendations for Outpatient Follow-up:  Follow up with PCP in 1-2 weeks Ambulatory referral to pulmonology  Home Health: No Equipment/Devices: None  Discharge Condition: Stable CODE STATUS: Full Diet recommendation: Regular  Brief/Interim Summary: 27 y.o. male with medical history significant of severe asthma, presented with worsening of cough wheezing shortness of breath.   Patient was recently hospitalized for asthma flareup and discharged home with tapering dose of prednisone.  Yesterday evening, patient started to have wheezing shortness of breath and soon became severe and decided to come back to the ED.  Denies any cough, no fever chills no chest pains.   ED Course: Afebrile, tachycardia, tachypneic and oxygen saturation 92% on room air.  Chest x-ray showed lungs are clear.  Patient was started on BiPAP to relieve breathing effort, blood work showed bicarb 26, creatinine 1.1, WBC 10.4.  11/1: Patient responded to respiratory therapy.  At time of discharge patient is on room air saturating well.  Very mild left-sided end expiratory wheeze.  Normal work of breathing.  No cough.  Lungs otherwise clear.  Stable for discharge home at this time.  Recommend 5-day course of prednisone 40 mg daily without taper.  Can resume home nebulizer regimen.  Patient takes Advair, can continue this for now.  Ambulatory referral to pulmonology placed.    Discharge Diagnoses:  Principal Problem:   Acute asthma exacerbation Active Problems:   Asthma  Acute exacerbation of asthma/status asthmaticus Patient required initiation of NIPPV.  This was weaned off at time of discharge.  He received intravenous steroids.  At time of discharge patient is on room air saturating well.  Speaking in complete  sentences without conversational dyspnea.  Very mild end expiratory wheeze on the right.  He is stable for discharge at this time.  Will recommend short course of prednisone 40 mg daily x 5 days.  Can resume home bronchodilator regimen.  Ambulatory referral to pulmonology placed at time of DC.  Discharge Instructions  Discharge Instructions     Ambulatory referral to Pulmonology   Complete by: As directed    Reason for referral: Asthma/COPD   Diet - low sodium heart healthy   Complete by: As directed    Increase activity slowly   Complete by: As directed       Allergies as of 09/17/2023   No Known Allergies      Medication List     TAKE these medications    acetaminophen 325 MG tablet Commonly known as: TYLENOL Take 2 tablets (650 mg total) by mouth every 6 (six) hours as needed for mild pain (or Fever >/= 101).   albuterol 108 (90 Base) MCG/ACT inhaler Commonly known as: VENTOLIN HFA Inhale 2 puffs into the lungs every 6 (six) hours as needed for wheezing or shortness of breath.   Fluticasone-Salmeterol 500-50 MCG/DOSE Aepb Commonly known as: ADVAIR Inhale 1 puff into the lungs 2 (two) times daily.   ipratropium-albuterol 0.5-2.5 (3) MG/3ML Soln Commonly known as: DUONEB Take 3 mLs by nebulization every 6 (six) hours as needed. Up to 4 times daily if needed for wheezing or shortness of breath. What changed:  when to take this reasons to take this   predniSONE 20 MG tablet Commonly known as: DELTASONE Take 2 tablets (40 mg total) by mouth daily with breakfast for 5 days.  No Known Allergies  Consultations: None   Procedures/Studies: DG Chest Port 1 View  Result Date: 09/16/2023 CLINICAL DATA:  Shortness of breath EXAM: PORTABLE CHEST 1 VIEW COMPARISON:  09/10/2023 FINDINGS: The heart size and mediastinal contours are within normal limits. Both lungs are clear. The visualized skeletal structures are unremarkable. IMPRESSION: No active disease.  Electronically Signed   By: Jasmine Pang M.D.   On: 09/16/2023 03:52   DG Chest Portable 1 View  Result Date: 09/10/2023 CLINICAL DATA:  Shortness of breath EXAM: PORTABLE CHEST 1 VIEW COMPARISON:  Chest x-ray 11/18/2021 FINDINGS: The heart size and mediastinal contours are within normal limits. Both lungs are clear. The visualized skeletal structures are unremarkable. IMPRESSION: No active disease. Electronically Signed   By: Darliss Cheney M.D.   On: 09/10/2023 23:07      Subjective: Seen and examined on the day of discharge.  Stable no distress.  Appropriate discharge home.  Discharge Exam: Vitals:   09/17/23 0730 09/17/23 0856  BP: 108/64 (!) 140/85  Pulse: 98 (!) 121  Resp: 16 17  Temp:  98.3 F (36.8 C)  SpO2: 90% 96%   Vitals:   09/17/23 0550 09/17/23 0715 09/17/23 0730 09/17/23 0856  BP:   108/64 (!) 140/85  Pulse:  100 98 (!) 121  Resp:  17 16 17   Temp: 98.2 F (36.8 C)   98.3 F (36.8 C)  TempSrc: Oral   Oral  SpO2:  91% 90% 96%  Weight:      Height:        General: Pt is alert, awake, not in acute distress Cardiovascular: RRR, S1/S2 +, no rubs, no gallops Respiratory: Mild right wheeze.  No crackles.  Normal work of breathing.  Room air Abdominal: Soft, NT, ND, bowel sounds + Extremities: no edema, no cyanosis    The results of significant diagnostics from this hospitalization (including imaging, microbiology, ancillary and laboratory) are listed below for reference.     Microbiology: Recent Results (from the past 240 hour(s))  Resp panel by RT-PCR (RSV, Flu A&B, Covid) Anterior Nasal Swab     Status: None   Collection Time: 09/11/23  1:15 AM   Specimen: Anterior Nasal Swab  Result Value Ref Range Status   SARS Coronavirus 2 by RT PCR NEGATIVE NEGATIVE Final    Comment: (NOTE) SARS-CoV-2 target nucleic acids are NOT DETECTED.  The SARS-CoV-2 RNA is generally detectable in upper respiratory specimens during the acute phase of infection. The  lowest concentration of SARS-CoV-2 viral copies this assay can detect is 138 copies/mL. A negative result does not preclude SARS-Cov-2 infection and should not be used as the sole basis for treatment or other patient management decisions. A negative result may occur with  improper specimen collection/handling, submission of specimen other than nasopharyngeal swab, presence of viral mutation(s) within the areas targeted by this assay, and inadequate number of viral copies(<138 copies/mL). A negative result must be combined with clinical observations, patient history, and epidemiological information. The expected result is Negative.  Fact Sheet for Patients:  BloggerCourse.com  Fact Sheet for Healthcare Providers:  SeriousBroker.it  This test is no t yet approved or cleared by the [place] FDA and  has been authorized for detection and/or diagnosis of SARS-CoV-2 by FDA under an Emergency Use Authorization (EUA). This EUA will remain  in effect (meaning this test can be used) for the duration of the COVID-19 declaration under Section 564(b)(1) of the Act, 21 U.S.C.section 360bbb-3(b)(1), unless the authorization is terminated  or revoked sooner.       Influenza A by PCR NEGATIVE NEGATIVE Final   Influenza B by PCR NEGATIVE NEGATIVE Final    Comment: (NOTE) The Xpert Xpress SARS-CoV-2/FLU/RSV plus assay is intended as an aid in the diagnosis of influenza from Nasopharyngeal swab specimens and should not be used as a sole basis for treatment. Nasal washings and aspirates are unacceptable for Xpert Xpress SARS-CoV-2/FLU/RSV testing.  Fact Sheet for Patients: BloggerCourse.com  Fact Sheet for Healthcare Providers: SeriousBroker.it  This test is not yet approved or cleared by the [place] FDA and has been authorized for detection and/or diagnosis of SARS-CoV-2 by FDA under  an Emergency Use Authorization (EUA). This EUA will remain in effect (meaning this test can be used) for the duration of the COVID-19 declaration under Section 564(b)(1) of the Act, 21 U.S.C. section 360bbb-3(b)(1), unless the authorization is terminated or revoked.     Resp Syncytial Virus by PCR NEGATIVE NEGATIVE Final    Comment: (NOTE) Fact Sheet for Patients: BloggerCourse.com  Fact Sheet for Healthcare Providers: SeriousBroker.it  This test is not yet approved or cleared by the [place] FDA and has been authorized for detection and/or diagnosis of SARS-CoV-2 by FDA under an Emergency Use Authorization (EUA). This EUA will remain in effect (meaning this test can be used) for the duration of the COVID-19 declaration under Section 564(b)(1) of the Act, 21 U.S.C. section 360bbb-3(b)(1), unless the authorization is terminated or revoked.  Performed at [facility], [address] [place], [place] [address]      Labs: BNP (last 3 results) No results for input(s): "BNP" in the last 8760 hours. Basic Metabolic Panel: Recent Labs  Lab 09/10/23 2216 09/16/23 0117  NA 138 139  K 3.6 3.8  CL 101 103  CO2 27 26  GLUCOSE 155* 117*  BUN 12 15  CREATININE 1.36* 1.14  CALCIUM 8.8* 8.6*  MG  --  2.4   Liver Function Tests: No results for input(s): "AST", "ALT", "ALKPHOS", "BILITOT", "PROT", "ALBUMIN" in the last 168 hours. No results for input(s): "LIPASE", "AMYLASE" in the last 168 hours. No results for input(s): "AMMONIA" in the last 168 hours. CBC: Recent Labs  Lab 09/10/23 2216 09/16/23 0117  WBC 10.0 10.4  NEUTROABS 7.6  --   HGB 14.4 15.5  HCT 43.5 46.0  MCV 86.8 83.8  PLT 275 293   Cardiac Enzymes: No results for input(s): "CKTOTAL", "CKMB", "CKMBINDEX", "TROPONINI" in the last 168 hours. BNP: Invalid input(s): "POCBNP" CBG: No results for input(s): "GLUCAP" in the last 168  hours. D-Dimer No results for input(s): "DDIMER" in the last 72 hours. Hgb A1c No results for input(s): "HGBA1C" in the last 72 hours. Lipid Profile No results for input(s): "CHOL", "HDL", "LDLCALC", "TRIG", "CHOLHDL", "LDLDIRECT" in the last 72 hours. Thyroid function studies No results for input(s): "TSH", "T4TOTAL", "T3FREE", "THYROIDAB" in the last 72 hours.  Invalid input(s): "FREET3" Anemia work up No results for input(s): "VITAMINB12", "FOLATE", "FERRITIN", "TIBC", "IRON", "RETICCTPCT" in the last 72 hours. Urinalysis No results found for: "COLORURINE", "APPEARANCEUR", "LABSPEC", "PHURINE", "GLUCOSEU", "HGBUR", "BILIRUBINUR", "KETONESUR", "PROTEINUR", "UROBILINOGEN", "NITRITE", "LEUKOCYTESUR" Sepsis Labs Recent Labs  Lab 09/10/23 2216 09/16/23 0117  WBC 10.0 10.4   Microbiology Recent Results (from the past 240 hour(s))  Resp panel by RT-PCR (RSV, Flu A&B, Covid) Anterior Nasal Swab     Status: None   Collection Time: 09/11/23  1:15 AM   Specimen: Anterior Nasal Swab  Result Value Ref Range Status  SARS Coronavirus 2 by RT PCR NEGATIVE NEGATIVE Final    Comment: (NOTE) SARS-CoV-2 target nucleic acids are NOT DETECTED.  The SARS-CoV-2 RNA is generally detectable in upper respiratory specimens during the acute phase of infection. The lowest concentration of SARS-CoV-2 viral copies this assay can detect is 138 copies/mL. A negative result does not preclude SARS-Cov-2 infection and should not be used as the sole basis for treatment or other patient management decisions. A negative result may occur with  improper specimen collection/handling, submission of specimen other than nasopharyngeal swab, presence of viral mutation(s) within the areas targeted by this assay, and inadequate number of viral copies(<138 copies/mL). A negative result must be combined with clinical observations, patient history, and epidemiological information. The expected result is Negative.  Fact  Sheet for Patients:  BloggerCourse.com  Fact Sheet for Healthcare Providers:  SeriousBroker.it  This test is no t yet approved or cleared by the [place] FDA and  has been authorized for detection and/or diagnosis of SARS-CoV-2 by FDA under an Emergency Use Authorization (EUA). This EUA will remain  in effect (meaning this test can be used) for the duration of the COVID-19 declaration under Section 564(b)(1) of the Act, 21 U.S.C.section 360bbb-3(b)(1), unless the authorization is terminated  or revoked sooner.       Influenza A by PCR NEGATIVE NEGATIVE Final   Influenza B by PCR NEGATIVE NEGATIVE Final    Comment: (NOTE) The Xpert Xpress SARS-CoV-2/FLU/RSV plus assay is intended as an aid in the diagnosis of influenza from Nasopharyngeal swab specimens and should not be used as a sole basis for treatment. Nasal washings and aspirates are unacceptable for Xpert Xpress SARS-CoV-2/FLU/RSV testing.  Fact Sheet for Patients: BloggerCourse.com  Fact Sheet for Healthcare Providers: SeriousBroker.it  This test is not yet approved or cleared by the [place] FDA and has been authorized for detection and/or diagnosis of SARS-CoV-2 by FDA under an Emergency Use Authorization (EUA). This EUA will remain in effect (meaning this test can be used) for the duration of the COVID-19 declaration under Section 564(b)(1) of the Act, 21 U.S.C. section 360bbb-3(b)(1), unless the authorization is terminated or revoked.     Resp Syncytial Virus by PCR NEGATIVE NEGATIVE Final    Comment: (NOTE) Fact Sheet for Patients: BloggerCourse.com  Fact Sheet for Healthcare Providers: SeriousBroker.it  This test is not yet approved or cleared by the [place] FDA and has been authorized for detection and/or diagnosis of SARS-CoV-2 by FDA under an  Emergency Use Authorization (EUA). This EUA will remain in effect (meaning this test can be used) for the duration of the COVID-19 declaration under Section 564(b)(1) of the Act, 21 U.S.C. section 360bbb-3(b)(1), unless the authorization is terminated or revoked.  Performed at [facility], [address] [place], [place] [address]      Time coordinating discharge: Over 30 minutes  SIGNED:   Tresa Moore, MD  [facility] 09/17/2023, 1:41 PM Pager   If 7PM-7AM, please contact night-coverage

## 2023-10-23 ENCOUNTER — Encounter: Payer: Self-pay | Admitting: Emergency Medicine

## 2023-10-23 ENCOUNTER — Emergency Department: Payer: Medicaid Other

## 2023-10-23 ENCOUNTER — Inpatient Hospital Stay
Admission: EM | Admit: 2023-10-23 | Discharge: 2023-10-24 | DRG: 202 | Disposition: A | Discharge status: Home / Self Care | Payer: Medicaid Other | Attending: Obstetrics and Gynecology | Admitting: Obstetrics and Gynecology

## 2023-10-23 ENCOUNTER — Other Ambulatory Visit: Payer: Self-pay

## 2023-10-23 DIAGNOSIS — J45901 Unspecified asthma with (acute) exacerbation: Secondary | ICD-10-CM | POA: Diagnosis not present

## 2023-10-23 DIAGNOSIS — J9601 Acute respiratory failure with hypoxia: Secondary | ICD-10-CM | POA: Diagnosis present

## 2023-10-23 DIAGNOSIS — Z7951 Long term (current) use of inhaled steroids: Secondary | ICD-10-CM

## 2023-10-23 DIAGNOSIS — Z1152 Encounter for screening for COVID-19: Secondary | ICD-10-CM | POA: Diagnosis not present

## 2023-10-23 DIAGNOSIS — J4551 Severe persistent asthma with (acute) exacerbation: Principal | ICD-10-CM | POA: Diagnosis present

## 2023-10-23 LAB — RESPIRATORY PANEL BY PCR

## 2023-10-23 LAB — BLOOD GAS, VENOUS
Acid-base deficit: 0.3 mmol/L (ref 0.0–2.0)
Bicarbonate: 24.1 mmol/L (ref 20.0–28.0)
O2 Saturation: 99.3 %
Patient temperature: 37
pCO2, Ven: 38 mm[Hg] — ABNORMAL LOW (ref 44–60)
pH, Ven: 7.41 (ref 7.25–7.43)
pO2, Ven: 97 mm[Hg] — ABNORMAL HIGH (ref 32–45)

## 2023-10-23 LAB — CBC WITH DIFFERENTIAL/PLATELET
Abs Immature Granulocytes: 0.05 10*3/uL (ref 0.00–0.07)
Basophils Absolute: 0.1 10*3/uL (ref 0.0–0.1)
Basophils Relative: 1 %
Eosinophils Absolute: 1.2 10*3/uL — ABNORMAL HIGH (ref 0.0–0.5)
Eosinophils Relative: 12 %
HCT: 42.3 % (ref 39.0–52.0)
Hemoglobin: 14.4 g/dL (ref 13.0–17.0)
Immature Granulocytes: 1 %
Lymphocytes Relative: 28 %
Lymphs Abs: 2.8 10*3/uL (ref 0.7–4.0)
MCH: 28.9 pg (ref 26.0–34.0)
MCHC: 34 g/dL (ref 30.0–36.0)
MCV: 84.9 fL (ref 80.0–100.0)
Monocytes Absolute: 0.9 10*3/uL (ref 0.1–1.0)
Monocytes Relative: 9 %
Neutro Abs: 4.9 10*3/uL (ref 1.7–7.7)
Neutrophils Relative %: 49 %
Platelets: 280 10*3/uL (ref 150–400)
RBC: 4.98 MIL/uL (ref 4.22–5.81)
RDW: 12.6 % (ref 11.5–15.5)
WBC: 10 10*3/uL (ref 4.0–10.5)
nRBC: 0 % (ref 0.0–0.2)

## 2023-10-23 LAB — COMPREHENSIVE METABOLIC PANEL
ALT: 46 U/L — ABNORMAL HIGH (ref 0–44)
AST: 29 U/L (ref 15–41)
Albumin: 4.4 g/dL (ref 3.5–5.0)
Alkaline Phosphatase: 45 U/L (ref 38–126)
Anion gap: 7 (ref 5–15)
BUN: 18 mg/dL (ref 6–20)
CO2: 27 mmol/L (ref 22–32)
Calcium: 8.7 mg/dL — ABNORMAL LOW (ref 8.9–10.3)
Chloride: 104 mmol/L (ref 98–111)
Creatinine, Ser: 1.2 mg/dL (ref 0.61–1.24)
GFR, Estimated: >60 mL/min (ref >60)
Glucose, Bld: 159 mg/dL — ABNORMAL HIGH (ref 70–99)
Potassium: 3.3 mmol/L — ABNORMAL LOW (ref 3.5–5.1)
Sodium: 138 mmol/L (ref 135–145)
Total Bilirubin: 0.6 mg/dL (ref <1.2)
Total Protein: 7.5 g/dL (ref 6.5–8.1)

## 2023-10-23 LAB — TROPONIN I (HIGH SENSITIVITY)
Troponin I (High Sensitivity): 5 ng/L (ref <18)
Troponin I (High Sensitivity): 5 ng/L (ref <18)

## 2023-10-23 LAB — SARS CORONAVIRUS 2 BY RT PCR: SARS Coronavirus 2 by RT PCR: NEGATIVE

## 2023-10-23 MED ORDER — ONDANSETRON HCL 4 MG PO TABS: 4.0000 mg | ORAL_TABLET | Freq: Four times a day (QID) | ORAL | Status: DC | PRN

## 2023-10-23 MED ORDER — ALBUTEROL SULFATE (2.5 MG/3ML) 0.083% IN NEBU
2.5000 mg | INHALATION_SOLUTION | RESPIRATORY_TRACT | Status: DC
  Administered 2023-10-23: 2.5 mg via RESPIRATORY_TRACT
  Filled 2023-10-23: qty 3

## 2023-10-23 MED ORDER — IPRATROPIUM-ALBUTEROL 0.5-2.5 (3) MG/3ML IN SOLN
3.0000 mL | RESPIRATORY_TRACT | Status: AC
Start: 2023-10-23 — End: 2023-10-23
  Administered 2023-10-23 (×2): 3 mL via RESPIRATORY_TRACT
  Filled 2023-10-23: qty 3

## 2023-10-23 MED ORDER — IPRATROPIUM-ALBUTEROL 0.5-2.5 (3) MG/3ML IN SOLN: 3.0000 mL | Freq: Four times a day (QID) | RESPIRATORY_TRACT | Status: DC

## 2023-10-23 MED ORDER — IPRATROPIUM-ALBUTEROL 0.5-2.5 (3) MG/3ML IN SOLN
3.0000 mL | RESPIRATORY_TRACT | Status: DC
  Administered 2023-10-23 – 2023-10-24 (×4): 3 mL via RESPIRATORY_TRACT
  Filled 2023-10-23 (×5): qty 3

## 2023-10-23 MED ORDER — IPRATROPIUM-ALBUTEROL 0.5-2.5 (3) MG/3ML IN SOLN
6.0000 mL | Freq: Once | RESPIRATORY_TRACT | Status: AC
  Administered 2023-10-23: 6 mL via RESPIRATORY_TRACT
  Filled 2023-10-23: qty 6

## 2023-10-23 MED ORDER — POTASSIUM CHLORIDE CRYS ER 20 MEQ PO TBCR
60.0000 meq | EXTENDED_RELEASE_TABLET | Freq: Once | ORAL | Status: AC
  Administered 2023-10-23: 60 meq via ORAL
  Filled 2023-10-23 (×2): qty 3

## 2023-10-23 MED ORDER — ALBUTEROL SULFATE (2.5 MG/3ML) 0.083% IN NEBU: 2.5000 mg | INHALATION_SOLUTION | RESPIRATORY_TRACT | Status: DC | PRN

## 2023-10-23 MED ORDER — ONDANSETRON HCL 4 MG/2ML IJ SOLN: 4.0000 mg | Freq: Four times a day (QID) | INTRAMUSCULAR | Status: DC | PRN

## 2023-10-23 MED ORDER — ACETAMINOPHEN 325 MG PO TABS: 650.0000 mg | ORAL_TABLET | Freq: Four times a day (QID) | ORAL | Status: DC | PRN

## 2023-10-23 MED ORDER — PREDNISONE 20 MG PO TABS: 40.0000 mg | ORAL_TABLET | Freq: Every day | ORAL | Status: DC

## 2023-10-23 MED ORDER — GUAIFENESIN ER 600 MG PO TB12
600.0000 mg | ORAL_TABLET | Freq: Two times a day (BID) | ORAL | Status: DC
  Administered 2023-10-23 (×2): 600 mg via ORAL
  Filled 2023-10-23 (×2): qty 1

## 2023-10-23 MED ORDER — METHYLPREDNISOLONE SODIUM SUCC 40 MG IJ SOLR
40.0000 mg | Freq: Four times a day (QID) | INTRAMUSCULAR | Status: AC
  Administered 2023-10-23 – 2023-10-24 (×3): 40 mg via INTRAVENOUS
  Filled 2023-10-23 (×3): qty 1

## 2023-10-23 MED ORDER — METHYLPREDNISOLONE SODIUM SUCC 40 MG IJ SOLR
40.0000 mg | Freq: Two times a day (BID) | INTRAMUSCULAR | Status: DC
  Administered 2023-10-23: 40 mg via INTRAVENOUS
  Filled 2023-10-23: qty 1

## 2023-10-23 MED ORDER — ENOXAPARIN SODIUM 40 MG/0.4ML IJ SOSY
40.0000 mg | PREFILLED_SYRINGE | INTRAMUSCULAR | Status: DC
  Filled 2023-10-23 (×2): qty 0.4

## 2023-10-23 MED ORDER — ACETAMINOPHEN 325 MG RE SUPP: 650.0000 mg | Freq: Four times a day (QID) | RECTAL | Status: DC | PRN

## 2023-10-23 NOTE — H&P (Addendum)
History and Physical    Patient: John Schwartz:[id number] DOB: 1996-09-07 DOA: 10/23/2023 DOS: the patient was seen and examined on 10/23/2023 PCP: Pcp, No  Patient coming from: Home  Chief Complaint: Wheezing and shortness of breath  HPI: Odas ORLAN RYCHLIK is a 27 y.o. male with medical history significant for Severe asthma, with poor outpatient follow-up who presented to the ED with acute shortness of breath typical for his asthma exacerbations.  This is his third admission in 6 weeks for the same.  He was last hospitalized from 10/31 to 11/1 and required BiPAP.  Tonight he arrives by EMS who administered DuoNebs, Solu-Medrol and magnesium en route.  He arrived on 4 L ED course and data review: BP 153/113, tachycardic to 127, tachypneic to 28.  Due to increased work of breathing he was started on BiPAP. Mother at bedside states he is compliant with his Advair and albuterol. Labs CBC unremarkable, CMP notable for potassium of 3.3 and ALT 46. EKG, personally viewed and interpreted showing sinus tachycardia at 118 with no acute ST-T wave changes. Chest x-ray nonacute Patient treated with additional [facility] Hospitalist consulted for admission.     Past Medical History:  Diagnosis Date   Asthma    Bronchospasm    Past Surgical History:  Procedure Laterality Date   NO PAST SURGERIES     Social History:  reports that he has never smoked. He has never used smokeless tobacco. He reports that he does not currently use drugs after having used the following drugs: Marijuana. He reports that he does not drink alcohol.  No Known Allergies  Family History  Problem Relation Age of Onset   Arthritis Paternal Grandmother     Prior to Admission medications   Medication Sig Start Date End Date Taking? Authorizing Provider  acetaminophen (TYLENOL) 325 MG tablet Take 2 tablets (650 mg total) by mouth every 6 (six) hours as needed for mild pain (or Fever >/= 101). 05/27/19   Altamese Dilling, MD  albuterol (VENTOLIN HFA) 108 (90 Base) MCG/ACT inhaler Inhale 2 puffs into the lungs every 6 (six) hours as needed for wheezing or shortness of breath. 09/17/23   Tresa Moore, MD  Fluticasone-Salmeterol (ADVAIR) 500-50 MCG/DOSE AEPB Inhale 1 puff into the lungs 2 (two) times daily. 09/11/23 12/10/23  Loyce Dys, MD  ipratropium-albuterol (DUONEB) 0.5-2.5 (3) MG/3ML SOLN Take 3 mLs by nebulization every 6 (six) hours as needed. Up to 4 times daily if needed for wheezing or shortness of breath. Patient taking differently: Take 3 mLs by nebulization 4 (four) times daily as needed (wheezing). Up to 4 times daily if needed for wheezing or shortness of breath. 11/19/21   Darlin Priestly, MD    Physical Exam: Vitals:   10/23/23 0341 10/23/23 0343  BP:  (!) 153/113  Pulse:  (!) 127  Resp:  (!) 28  SpO2:  100%  Weight: 90 kg    Physical Exam Vitals and nursing note reviewed.  Constitutional:      General: He is in acute distress.     Comments: Increased work of breathing.  Unable to speak in complete sentences  HENT:     Head: Normocephalic and atraumatic.  Cardiovascular:     Rate and Rhythm: Regular rhythm. Tachycardia present.     Heart sounds: Normal heart sounds.  Pulmonary:     Effort: Tachypnea present.     Breath sounds: Wheezing and rhonchi present.  Abdominal:     Palpations: Abdomen is soft.  Tenderness: There is no abdominal tenderness.  Neurological:     Mental Status: Mental status is at baseline.     Labs on Admission: I have personally reviewed following labs and imaging studies  CBC: Recent Labs  Lab 10/23/23 0347  WBC 10.0  NEUTROABS 4.9  HGB 14.4  HCT 42.3  MCV 84.9  PLT 280   Basic Metabolic Panel: Recent Labs  Lab 10/23/23 0347  NA 138  K 3.3*  CL 104  CO2 27  GLUCOSE 159*  BUN 18  CREATININE 1.20  CALCIUM 8.7*   GFR: Estimated Creatinine Clearance: 104.4 mL/min (by C-G formula based on SCr of 1.2 mg/dL). Liver  Function Tests: Recent Labs  Lab 10/23/23 0347  AST 29  ALT 46*  ALKPHOS 45  BILITOT 0.6  PROT 7.5  ALBUMIN 4.4   No results for input(s): "LIPASE", "AMYLASE" in the last 168 hours. No results for input(s): "AMMONIA" in the last 168 hours. Coagulation Profile: No results for input(s): "INR", "PROTIME" in the last 168 hours. Cardiac Enzymes: No results for input(s): "CKTOTAL", "CKMB", "CKMBINDEX", "TROPONINI" in the last 168 hours. BNP (last 3 results) No results for input(s): "PROBNP" in the last 8760 hours. HbA1C: No results for input(s): "HGBA1C" in the last 72 hours. CBG: No results for input(s): "GLUCAP" in the last 168 hours. Lipid Profile: No results for input(s): "CHOL", "HDL", "LDLCALC", "TRIG", "CHOLHDL", "LDLDIRECT" in the last 72 hours. Thyroid Function Tests: No results for input(s): "TSH", "T4TOTAL", "FREET4", "T3FREE", "THYROIDAB" in the last 72 hours. Anemia Panel: No results for input(s): "VITAMINB12", "FOLATE", "FERRITIN", "TIBC", "IRON", "RETICCTPCT" in the last 72 hours. Urine analysis: No results found for: "COLORURINE", "APPEARANCEUR", "LABSPEC", "PHURINE", "GLUCOSEU", "HGBUR", "BILIRUBINUR", "KETONESUR", "PROTEINUR", "UROBILINOGEN", "NITRITE", "LEUKOCYTESUR"  Radiological Exams on Admission: No results found.   Data Reviewed: Relevant notes from primary care and specialist visits, past discharge summaries as available in EHR, including Care Everywhere. Prior diagnostic testing as pertinent to current admission diagnoses Updated medications and problem lists for reconciliation ED course, including vitals, labs, imaging, treatment and response to treatment Triage notes, nursing and pharmacy notes and ED provider's notes Notable results as noted in HPI   Assessment and Plan:   Acute exacerbation of severe persistent asthma -DuoNebs every 6 with albuterol every 2 as needed wheezing -IV steroids - Continue BiPAP and wean as tolerated -Close  monitoring for clinical deterioration -Might benefit from outpatient referral to pulmonology    DVT prophylaxis: Lovenox  Consults: none  Advance Care Planning:   Code Status: Prior   Family Communication: mother at bedside  Disposition Plan: Back to previous home environment  Severity of Illness: The appropriate patient status for this patient is INPATIENT. Inpatient status is judged to be reasonable and necessary in order to provide the required intensity of service to ensure the patient's safety. The patient's presenting symptoms, physical exam findings, and initial radiographic and laboratory data in the context of their chronic comorbidities is felt to place them at high risk for further clinical deterioration. Furthermore, it is not anticipated that the patient will be medically stable for discharge from the hospital within 2 midnights of admission.   * I certify that at the point of admission it is my clinical judgment that the patient will require inpatient hospital care spanning beyond 2 midnights from the point of admission due to high intensity of service, high risk for further deterioration and high frequency of surveillance required.*  Author: Andris Baumann, MD 10/23/2023 4:57 AM  For on  call review [email or web address].

## 2023-10-23 NOTE — ED Triage Notes (Addendum)
Patient BIB [facility] c/o sob that started this evening.  Patient did two duonebs PTA at home.  EMS gave 125 mg of solu-medrol and 2 g of Mag.  Patient with wheezing throughout, unable to speak in complete sentences.   18 R AC 4 L [place] 146/100 120 HR

## 2023-10-23 NOTE — Progress Notes (Signed)
Pt taken off bipap, no shortness of breath noted, roomair sats 95%, RN notified.

## 2023-10-23 NOTE — ED Provider Notes (Signed)
[facility] Provider Note    Event Date/Time   First MD Initiated Contact with Patient 10/23/23 0413     (approximate)   History   No chief complaint on file.   HPI  John Schwartz is a 27 y.o. male who presents to the ED for evaluation of No chief complaint on file.   I review 11/1 medical DC summary.  Severe asthma.  Presents to the ED with a couple hours of shortness of breath.  History is limited due to respiratory status.   Physical Exam   Triage Vital Signs: ED Triage Vitals  Encounter Vitals Group     BP 10/23/23 0343 (!) 153/113     Systolic BP Percentile --      Diastolic BP Percentile --      Pulse Rate 10/23/23 0343 (!) 127     Resp 10/23/23 0343 (!) 28     Temp --      Temp src --      SpO2 10/23/23 0343 100 %     Weight 10/23/23 0341 198 lb 6.6 oz (90 kg)     Height --      Head Circumference --      Peak Flow --      Pain Score 10/23/23 0341 0     Pain Loc --      Pain Education --      Exclude from Growth Chart --     Most recent vital signs: Vitals:   10/23/23 0343  BP: (!) 153/113  Pulse: (!) 127  Resp: (!) 28  SpO2: 100%    General: Awake, no distress.  CV:  Good peripheral perfusion.  Resp:  Belly breathing, subcostal retractions, tachypneic, poor airflow and wheezing throughout. Abd:  No distention.  MSK:  No deformity noted.  Neuro:  No focal deficits appreciated. Other:     ED Results / Procedures / Treatments   Labs (all labs ordered are listed, but only abnormal results are displayed) Labs Reviewed  CBC WITH DIFFERENTIAL/PLATELET - Abnormal; Notable for the following components:      Result Value   Eosinophils Absolute 1.2 (*)    All other components within normal limits  COMPREHENSIVE METABOLIC PANEL - Abnormal; Notable for the following components:   Potassium 3.3 (*)    Glucose, Bld 159 (*)    Calcium 8.7 (*)    ALT 46 (*)    All other components within normal limits  TROPONIN I (HIGH  SENSITIVITY)    EKG Sinus tachycardia 3 to 118 bpm.  Normal axis and intervals.  No STEMI.  RADIOLOGY CXR interpreted by me without evidence of acute cardiopulmonary pathology.  Official radiology report(s): No results found.  PROCEDURES and INTERVENTIONS:  .1-3 Lead EKG Interpretation  Performed by: Delton Prairie, MD Authorized by: Delton Prairie, MD     Interpretation: abnormal     ECG rate:  110   ECG rate assessment: tachycardic     Rhythm: sinus tachycardia     Ectopy: none     Conduction: normal   .Critical Care  Performed by: Delton Prairie, MD Authorized by: Delton Prairie, MD   Critical care provider statement:    Critical care time (minutes):  30   Critical care time was exclusive of:  Separately billable procedures and treating other patients   Critical care was necessary to treat or prevent imminent or life-threatening deterioration of the following conditions:  Respiratory failure   Critical care was time  spent personally by me on the following activities:  Development of treatment plan with patient or surrogate, discussions with consultants, evaluation of patient's response to treatment, examination of patient, ordering and review of laboratory studies, ordering and review of radiographic studies, ordering and performing treatments and interventions, pulse oximetry, re-evaluation of patient's condition and review of old charts   Medications  ipratropium-albuterol (DUONEB) 0.5-2.5 (3) MG/3ML nebulizer solution 6 mL (6 mLs Nebulization Given 10/23/23 0420)     IMPRESSION / MDM / ASSESSMENT AND PLAN / ED COURSE  I reviewed the triage vital signs and the nursing notes.  Differential diagnosis includes, but is not limited to, pneumothorax, pneumonia, asthma exacerbation, viral syndrome  {Patient presents with symptoms of an acute illness or injury that is potentially life-threatening.  Patient with severe asthma presents then exacerbation requiring BiPAP and medical  admission.  CXR is clear, EKG is not ischemic, basic labs with CBC and metabolic panel are normal.  Received steroids and magnesium with EMS.  Despite multiple breathing treatments he is still struggling.  We will start BiPAP and consult medicine for admission.      FINAL CLINICAL IMPRESSION(S) / ED DIAGNOSES   Final diagnoses:  Severe persistent asthma with exacerbation     Rx / DC Orders   ED Discharge Orders     None        Note:  This document was prepared using Dragon voice recognition software and may include unintentional dictation errors.   Delton Prairie, MD 10/23/23 [phone]

## 2023-10-23 NOTE — Progress Notes (Signed)
PROGRESS NOTE    John Schwartz  ZDG:[id number] DOB: 06-Dec-1995 DOA: 10/23/2023 PCP: Pcp, No  Outpatient Specialists: pulm    Brief Narrative:   From admission h and p John Schwartz is a 27 y.o. male with medical history significant for Severe asthma, with poor outpatient follow-up who presented to the ED with acute shortness of breath typical for his asthma exacerbations.  This is his third admission in 6 weeks for the same.  He was last hospitalized from 10/31 to 11/1 and required BiPAP.  Tonight he arrives by EMS who administered DuoNebs, Solu-Medrol and magnesium en route.  He arrived on 4 L    Assessment & Plan:   Principal Problem:   Acute severe exacerbation of asthma  # Severe acute asthma exacerbation Received mag and solumedrol, started on bipap. Gone one duoneb here. Cxr here. Reports feeling somewhat better. Followed by Erline Hau Meredeth Ide) as outpt, last saw in August and advised 3 mo f/u - solumedrol 40 q6 for first 24 doses - 2 more duonebs q 20 then duonebs q6, albuterol q2 - check covid and rvp - check vbg - maintain o2 above 92 - close pulmonology f/u    DVT prophylaxis: lovenox Code Status: full Family Communication: mother at bedside  Level of care: Progressive Status is: Inpatient Remains inpatient appropriate because: severity of illness    Consultants:  none  Procedures: none  Antimicrobials:  none    Subjective: Reports chest still tight and wheezing but feeling somewhat improved  Objective: Vitals:   10/23/23 0341 10/23/23 0343 10/23/23 0500  BP:  (!) 153/113 (!) 149/106  Pulse:  (!) 127 (!) 113  Resp:  (!) 28   SpO2:  100% 98%  Weight: 90 kg     No intake or output data in the 24 hours ending 10/23/23 0822 Filed Weights   10/23/23 0341  Weight: 90 kg    Examination:  General exam: Appears calm and comfortable  Respiratory system: tachypneic, insp and exp wheeze Cardiovascular system: S1 & S2 heard, RRR. N   Gastrointestinal system: Abdomen is nondistended, soft and nontender. N  Central nervous system: Alert and oriented. No focal neurological deficits. Extremities: Symmetric 5 x 5 power. Skin: No rashes, lesions or ulcers Psychiatry: Judgement and insight appear normal. Mood & affect appropriate.     Data Reviewed: I have personally reviewed following labs and imaging studies  CBC: Recent Labs  Lab 10/23/23 0347  WBC 10.0  NEUTROABS 4.9  HGB 14.4  HCT 42.3  MCV 84.9  PLT 280   Basic Metabolic Panel: Recent Labs  Lab 10/23/23 0347  NA 138  K 3.3*  CL 104  CO2 27  GLUCOSE 159*  BUN 18  CREATININE 1.20  CALCIUM 8.7*   GFR: Estimated Creatinine Clearance: 104.4 mL/min (by C-G formula based on SCr of 1.2 mg/dL). Liver Function Tests: Recent Labs  Lab 10/23/23 0347  AST 29  ALT 46*  ALKPHOS 45  BILITOT 0.6  PROT 7.5  ALBUMIN 4.4   No results for input(s): "LIPASE", "AMYLASE" in the last 168 hours. No results for input(s): "AMMONIA" in the last 168 hours. Coagulation Profile: No results for input(s): "INR", "PROTIME" in the last 168 hours. Cardiac Enzymes: No results for input(s): "CKTOTAL", "CKMB", "CKMBINDEX", "TROPONINI" in the last 168 hours. BNP (last 3 results) No results for input(s): "PROBNP" in the last 8760 hours. HbA1C: No results for input(s): "HGBA1C" in the last 72 hours. CBG: No results for input(s): "GLUCAP" in the last  168 hours. Lipid Profile: No results for input(s): "CHOL", "HDL", "LDLCALC", "TRIG", "CHOLHDL", "LDLDIRECT" in the last 72 hours. Thyroid Function Tests: No results for input(s): "TSH", "T4TOTAL", "FREET4", "T3FREE", "THYROIDAB" in the last 72 hours. Anemia Panel: No results for input(s): "VITAMINB12", "FOLATE", "FERRITIN", "TIBC", "IRON", "RETICCTPCT" in the last 72 hours. Urine analysis: No results found for: "COLORURINE", "APPEARANCEUR", "LABSPEC", "PHURINE", "GLUCOSEU", "HGBUR", "BILIRUBINUR", "KETONESUR", "PROTEINUR",  "UROBILINOGEN", "NITRITE", "LEUKOCYTESUR" Sepsis Labs: @LABRCNTIP (procalcitonin:4,lacticidven:4)  )No results found for this or any previous visit (from the past 240 hour(s)).       Radiology Studies: DG Chest 2 View  Result Date: 10/23/2023 CLINICAL DATA:  27 year old male with shortness of breath. Wheezing. Nebulizer treatments at home. EXAM: CHEST - 2 VIEW COMPARISON:  Chest radiographs 09/16/2023 and earlier. FINDINGS: Seated AP and lateral views at 0405 hours. Normal cardiac size and mediastinal contours. Visualized tracheal air column is within normal limits. Stable lung volumes at the upper limits of normal. No pneumothorax, pulmonary edema or pleural effusion. No convincing consolidation or confluent lung opacity. Lung interstitial markings are within normal limits. No osseous abnormality identified.  Negative visible bowel gas. IMPRESSION: Negative.  No cardiopulmonary abnormality. Electronically Signed   By: Odessa Fleming M.D.   On: 10/23/2023 05:09        Scheduled Meds:  albuterol  2.5 mg Nebulization Q2H   enoxaparin (LOVENOX) injection  40 mg Subcutaneous Q24H   guaiFENesin  600 mg Oral BID   ipratropium-albuterol  3 mL Nebulization Q6H   ipratropium-albuterol  3 mL Nebulization Q20 Min   methylPREDNISolone (SOLU-MEDROL) injection  40 mg Intravenous Q6H   Continuous Infusions:   LOS: 0 days     Silvano Bilis, MD [facility]   If 7PM-7AM, please contact night-coverage [email or web address] Password [email or web address] 10/23/2023, 8:22 AM

## 2023-10-24 DIAGNOSIS — J45901 Unspecified asthma with (acute) exacerbation: Secondary | ICD-10-CM | POA: Diagnosis not present

## 2023-10-24 LAB — BASIC METABOLIC PANEL
Anion gap: 9 (ref 5–15)
BUN: 18 mg/dL (ref 6–20)
CO2: 19 mmol/L — ABNORMAL LOW (ref 22–32)
Calcium: 9.1 mg/dL (ref 8.9–10.3)
Chloride: 105 mmol/L (ref 98–111)
Creatinine, Ser: 1.07 mg/dL (ref 0.61–1.24)
GFR, Estimated: >60 mL/min (ref >60)
Glucose, Bld: 140 mg/dL — ABNORMAL HIGH (ref 70–99)
Potassium: 4.8 mmol/L (ref 3.5–5.1)
Sodium: 133 mmol/L — ABNORMAL LOW (ref 135–145)

## 2023-10-24 MED ORDER — PREDNISONE 10 MG PO TABS: ORAL_TABLET | ORAL | 0 refills | Status: DC

## 2023-10-24 MED ORDER — ALBUTEROL SULFATE HFA 108 (90 BASE) MCG/ACT IN AERS
2.0000 | INHALATION_SPRAY | RESPIRATORY_TRACT | 2 refills | Status: DC | PRN
Start: 2023-10-24 — End: 2023-11-23

## 2023-10-24 NOTE — Discharge Summary (Signed)
John Schwartz ZOX:[id number] DOB: 14-Apr-1996 DOA: 10/23/2023  PCP: Pcp, No  Admit date: 10/23/2023 Discharge date: 10/24/2023  Time spent: 35 minutes  Recommendations for Outpatient Follow-up:  Pulmonology f/u     Discharge Diagnoses:  Principal Problem:   Acute severe exacerbation of asthma   Discharge Condition: improved  Diet recommendation: regular  Filed Weights   10/23/23 0341  Weight: 90 kg    History of present illness:  From admission h and p John Schwartz is a 27 y.o. male with medical history significant of severe asthma, presented with worsening of cough wheezing shortness of breath.   Patient was recently hospitalized for asthma flareup and discharged home with tapering dose of prednisone.  Yesterday evening, patient started to have wheezing shortness of breath and soon became severe and decided to come back to the ED.  Denies any cough, no fever chills no chest pains.    Hospital Course:  Patient presents with severe asthma exacerbation. Cxr clear, no hypoxia, covid and respiratory panel negative. Treated with magnesium en route with EMS and here with breathing treatments and IV steroids, as well as a brief amount of time on bipap. By day of discharge tachypnea resolved, patient reports feeling close to his normal self, ambulating without difficulty, requesting discharge. Advised patient to continue scheduled albuterol until symptoms fully resolve and will complete prednisone taper. Patient is overdue for f/u with pulmonology and so I strongly advised this.   Procedures: none   Consultations: none  Discharge Exam: Vitals:   10/24/23 0254 10/24/23 0530  BP:  119/85  Pulse:  94  Resp:  14  Temp: 98.2 F (36.8 C)   SpO2:  95%    General: NAD Cardiovascular: RRR Respiratory: faint exp wheeze, no tachypnea or increased wob  Discharge Instructions   Discharge Instructions     Diet general   Complete by: As directed    Increase activity slowly    Complete by: As directed       Allergies as of 10/24/2023   No Known Allergies      Medication List     TAKE these medications    acetaminophen 325 MG tablet Commonly known as: TYLENOL Take 2 tablets (650 mg total) by mouth every 6 (six) hours as needed for mild pain (or Fever >/= 101).   albuterol 108 (90 Base) MCG/ACT inhaler Commonly known as: VENTOLIN HFA Inhale 2 puffs into the lungs every 4 (four) hours as needed for wheezing or shortness of breath. What changed: when to take this   Fluticasone-Salmeterol 500-50 MCG/DOSE Aepb Commonly known as: ADVAIR Inhale 1 puff into the lungs 2 (two) times daily.   ipratropium-albuterol 0.5-2.5 (3) MG/3ML Soln Commonly known as: DUONEB Take 3 mLs by nebulization every 6 (six) hours as needed. Up to 4 times daily if needed for wheezing or shortness of breath. What changed:  when to take this reasons to take this   predniSONE 10 MG tablet Commonly known as: DELTASONE 4 tabs daily for 3 days then 3 tabs daily for 3 days then 2 tabs daily for 3 days then 1 tab daily for 3 days What changed:  medication strength how much to take how to take this when to take this additional instructions       No Known Allergies  Follow-up Information     Mertie Moores, MD Follow up.   Specialty: Specialist Contact information: [phone] [facility] [place] - PULMONOLOGY [place] [address] [phone]  The results of significant diagnostics from this hospitalization (including imaging, microbiology, ancillary and laboratory) are listed below for reference.    Significant Diagnostic Studies: DG Chest 2 View  Result Date: 10/23/2023 CLINICAL DATA:  27 year old male with shortness of breath. Wheezing. Nebulizer treatments at home. EXAM: CHEST - 2 VIEW COMPARISON:  Chest radiographs 09/16/2023 and earlier. FINDINGS: Seated AP and lateral views at 0405 hours. Normal cardiac size and  mediastinal contours. Visualized tracheal air column is within normal limits. Stable lung volumes at the upper limits of normal. No pneumothorax, pulmonary edema or pleural effusion. No convincing consolidation or confluent lung opacity. Lung interstitial markings are within normal limits. No osseous abnormality identified.  Negative visible bowel gas. IMPRESSION: Negative.  No cardiopulmonary abnormality. Electronically Signed   By: Odessa Fleming M.D.   On: 10/23/2023 05:09    Microbiology: Recent Results (from the past 240 hour(s))  SARS Coronavirus 2 by RT PCR (hospital order, performed in [facility] hospital lab) *cepheid single result test* Anterior Nasal Swab     Status: None   Collection Time: 10/23/23 10:23 AM   Specimen: Anterior Nasal Swab  Result Value Ref Range Status   SARS Coronavirus 2 by RT PCR NEGATIVE NEGATIVE Final    Comment: (NOTE) SARS-CoV-2 target nucleic acids are NOT DETECTED.  The SARS-CoV-2 RNA is generally detectable in upper and lower respiratory specimens during the acute phase of infection. The lowest concentration of SARS-CoV-2 viral copies this assay can detect is 250 copies / mL. A negative result does not preclude SARS-CoV-2 infection and should not be used as the sole basis for treatment or other patient management decisions.  A negative result may occur with improper specimen collection / handling, submission of specimen other than nasopharyngeal swab, presence of viral mutation(s) within the areas targeted by this assay, and inadequate number of viral copies (<250 copies / mL). A negative result must be combined with clinical observations, patient history, and epidemiological information.  Fact Sheet for Patients:   RoadLapTop.co.za  Fact Sheet for Healthcare Providers: http://kim-miller.com/  This test is not yet approved or  cleared by the [place] FDA and has been authorized for detection and/or  diagnosis of SARS-CoV-2 by FDA under an Emergency Use Authorization (EUA).  This EUA will remain in effect (meaning this test can be used) for the duration of the COVID-19 declaration under Section 564(b)(1) of the Act, 21 U.S.C. section 360bbb-3(b)(1), unless the authorization is terminated or revoked sooner.  Performed at [facility], [address] [place], [place] [address]   Respiratory (~20 pathogens) panel by PCR     Status: None   Collection Time: 10/23/23 10:23 AM   Specimen: Nasopharyngeal Swab; Respiratory  Result Value Ref Range Status   Adenovirus NOT DETECTED NOT DETECTED Final   Coronavirus 229E NOT DETECTED NOT DETECTED Final    Comment: (NOTE) The Coronavirus on the Respiratory Panel, DOES NOT test for the novel  Coronavirus (2019 nCoV)    Coronavirus HKU1 NOT DETECTED NOT DETECTED Final   Coronavirus NL63 NOT DETECTED NOT DETECTED Final   Coronavirus OC43 NOT DETECTED NOT DETECTED Final   Metapneumovirus NOT DETECTED NOT DETECTED Final   Rhinovirus / Enterovirus NOT DETECTED NOT DETECTED Final   Influenza A NOT DETECTED NOT DETECTED Final   Influenza B NOT DETECTED NOT DETECTED Final   Parainfluenza Virus 1 NOT DETECTED NOT DETECTED Final   Parainfluenza Virus 2 NOT DETECTED NOT DETECTED Final   Parainfluenza Virus 3 NOT DETECTED NOT DETECTED  Final   Parainfluenza Virus 4 NOT DETECTED NOT DETECTED Final   Respiratory Syncytial Virus NOT DETECTED NOT DETECTED Final   Bordetella pertussis NOT DETECTED NOT DETECTED Final   Bordetella Parapertussis NOT DETECTED NOT DETECTED Final   Chlamydophila pneumoniae NOT DETECTED NOT DETECTED Final   Mycoplasma pneumoniae NOT DETECTED NOT DETECTED Final    Comment: Performed at [facility], [address], [place], [place] [address]     Labs: Basic Metabolic Panel: Recent Labs  Lab 10/23/23 0347 10/24/23 0421  NA 138 133*  K 3.3* 4.8  CL 104 105  CO2 27 19*  GLUCOSE 159* 140*  BUN 18 18   CREATININE 1.20 1.07  CALCIUM 8.7* 9.1   Liver Function Tests: Recent Labs  Lab 10/23/23 0347  AST 29  ALT 46*  ALKPHOS 45  BILITOT 0.6  PROT 7.5  ALBUMIN 4.4   No results for input(s): "LIPASE", "AMYLASE" in the last 168 hours. No results for input(s): "AMMONIA" in the last 168 hours. CBC: Recent Labs  Lab 10/23/23 0347  WBC 10.0  NEUTROABS 4.9  HGB 14.4  HCT 42.3  MCV 84.9  PLT 280   Cardiac Enzymes: No results for input(s): "CKTOTAL", "CKMB", "CKMBINDEX", "TROPONINI" in the last 168 hours. BNP: BNP (last 3 results) No results for input(s): "BNP" in the last 8760 hours.  ProBNP (last 3 results) No results for input(s): "PROBNP" in the last 8760 hours.  CBG: No results for input(s): "GLUCAP" in the last 168 hours.     Signed:  Silvano Bilis MD.  [facility] 10/24/2023, 8:32 AM

## 2023-11-22 ENCOUNTER — Emergency Department: Payer: Medicaid Other

## 2023-11-22 ENCOUNTER — Observation Stay
Admission: EM | Admit: 2023-11-22 | Discharge: 2023-11-23 | Disposition: A | Discharge status: Home / Self Care | Payer: Medicaid Other | Attending: Emergency Medicine | Admitting: Emergency Medicine

## 2023-11-22 ENCOUNTER — Other Ambulatory Visit: Payer: Self-pay

## 2023-11-22 DIAGNOSIS — R000 Tachycardia, unspecified: Secondary | ICD-10-CM | POA: Diagnosis not present

## 2023-11-22 DIAGNOSIS — Z79899 Other long term (current) drug therapy: Secondary | ICD-10-CM | POA: Diagnosis not present

## 2023-11-22 DIAGNOSIS — J4542 Moderate persistent asthma with status asthmaticus: Principal | ICD-10-CM

## 2023-11-22 DIAGNOSIS — J4551 Severe persistent asthma with (acute) exacerbation: Secondary | ICD-10-CM | POA: Diagnosis not present

## 2023-11-22 DIAGNOSIS — R0602 Shortness of breath: Secondary | ICD-10-CM | POA: Diagnosis present

## 2023-11-22 DIAGNOSIS — Z1152 Encounter for screening for COVID-19: Secondary | ICD-10-CM | POA: Diagnosis not present

## 2023-11-22 LAB — BASIC METABOLIC PANEL
Anion gap: 12 (ref 5–15)
BUN: 11 mg/dL (ref 6–20)
CO2: 23 mmol/L (ref 22–32)
Calcium: 9.2 mg/dL (ref 8.9–10.3)
Chloride: 102 mmol/L (ref 98–111)
Creatinine, Ser: 1.2 mg/dL (ref 0.61–1.24)
GFR, Estimated: >60 mL/min (ref >60)
Glucose, Bld: 85 mg/dL (ref 70–99)
Potassium: 3.7 mmol/L (ref 3.5–5.1)
Sodium: 137 mmol/L (ref 135–145)

## 2023-11-22 LAB — CBC
HCT: 43.3 % (ref 39.0–52.0)
Hemoglobin: 14.5 g/dL (ref 13.0–17.0)
MCH: 29.1 pg (ref 26.0–34.0)
MCHC: 33.5 g/dL (ref 30.0–36.0)
MCV: 86.9 fL (ref 80.0–100.0)
Platelets: 280 10*3/uL (ref 150–400)
RBC: 4.98 MIL/uL (ref 4.22–5.81)
RDW: 12.3 % (ref 11.5–15.5)
WBC: 9.8 10*3/uL (ref 4.0–10.5)
nRBC: 0 % (ref 0.0–0.2)

## 2023-11-22 LAB — RESP PANEL BY RT-PCR (RSV, FLU A&B, COVID)  RVPGX2
Influenza A by PCR: NEGATIVE
Influenza B by PCR: NEGATIVE
Resp Syncytial Virus by PCR: NEGATIVE
SARS Coronavirus 2 by RT PCR: NEGATIVE

## 2023-11-22 MED ORDER — METHYLPREDNISOLONE SODIUM SUCC 40 MG IJ SOLR: 40.0000 mg | Freq: Two times a day (BID) | INTRAMUSCULAR | Status: DC

## 2023-11-22 MED ORDER — ACETAMINOPHEN 325 MG PO TABS: 650.0000 mg | ORAL_TABLET | Freq: Four times a day (QID) | ORAL | Status: DC | PRN

## 2023-11-22 MED ORDER — IPRATROPIUM BROMIDE 0.02 % IN SOLN
0.5000 mg | Freq: Four times a day (QID) | RESPIRATORY_TRACT | Status: DC
  Administered 2023-11-23 (×3): 0.5 mg via RESPIRATORY_TRACT
  Filled 2023-11-22 (×3): qty 2.5

## 2023-11-22 MED ORDER — ALBUTEROL (5 MG/ML) CONTINUOUS INHALATION SOLN
7.5000 mg/h | INHALATION_SOLUTION | RESPIRATORY_TRACT | Status: AC
  Administered 2023-11-22: 7.5 mg/h via RESPIRATORY_TRACT
  Filled 2023-11-22: qty 20

## 2023-11-22 MED ORDER — IPRATROPIUM-ALBUTEROL 0.5-2.5 (3) MG/3ML IN SOLN
3.0000 mL | Freq: Once | RESPIRATORY_TRACT | Status: AC
  Administered 2023-11-22: 3 mL via RESPIRATORY_TRACT
  Filled 2023-11-22: qty 3

## 2023-11-22 MED ORDER — METHYLPREDNISOLONE SODIUM SUCC 40 MG IJ SOLR
40.0000 mg | Freq: Two times a day (BID) | INTRAMUSCULAR | Status: AC
  Administered 2023-11-23: 40 mg via INTRAVENOUS
  Filled 2023-11-22: qty 1

## 2023-11-22 MED ORDER — PREDNISONE 20 MG PO TABS: 40.0000 mg | ORAL_TABLET | Freq: Every day | ORAL | Status: DC

## 2023-11-22 MED ORDER — ONDANSETRON HCL 4 MG PO TABS: 4.0000 mg | ORAL_TABLET | Freq: Four times a day (QID) | ORAL | Status: DC | PRN

## 2023-11-22 MED ORDER — ONDANSETRON HCL 4 MG/2ML IJ SOLN: 4.0000 mg | Freq: Four times a day (QID) | INTRAMUSCULAR | Status: DC | PRN

## 2023-11-22 MED ORDER — ALBUTEROL SULFATE (2.5 MG/3ML) 0.083% IN NEBU
2.5000 mg | INHALATION_SOLUTION | Freq: Once | RESPIRATORY_TRACT | Status: AC
Start: 2023-11-22 — End: 2023-11-22
  Administered 2023-11-22: 2.5 mg via RESPIRATORY_TRACT
  Filled 2023-11-22: qty 3

## 2023-11-22 MED ORDER — METHYLPREDNISOLONE SODIUM SUCC 125 MG IJ SOLR
125.0000 mg | INTRAMUSCULAR | Status: AC
  Administered 2023-11-22: 125 mg via INTRAVENOUS
  Filled 2023-11-22: qty 2

## 2023-11-22 MED ORDER — ENOXAPARIN SODIUM 40 MG/0.4ML IJ SOSY
40.0000 mg | PREFILLED_SYRINGE | INTRAMUSCULAR | Status: DC
  Filled 2023-11-22: qty 0.4

## 2023-11-22 MED ORDER — ACETAMINOPHEN 650 MG RE SUPP: 650.0000 mg | Freq: Four times a day (QID) | RECTAL | Status: DC | PRN

## 2023-11-22 MED ORDER — MAGNESIUM SULFATE 2 GM/50ML IV SOLN
2.0000 g | Freq: Once | INTRAVENOUS | Status: AC
Start: 2023-11-22 — End: 2023-11-22
  Administered 2023-11-22: 2 g via INTRAVENOUS
  Filled 2023-11-22: qty 50

## 2023-11-22 MED ORDER — LEVALBUTEROL HCL 0.63 MG/3ML IN NEBU
0.6300 mg | INHALATION_SOLUTION | Freq: Four times a day (QID) | RESPIRATORY_TRACT | Status: DC
  Administered 2023-11-23 (×3): 0.63 mg via RESPIRATORY_TRACT
  Filled 2023-11-22 (×3): qty 3

## 2023-11-22 MED ORDER — LEVALBUTEROL HCL 0.63 MG/3ML IN NEBU: 0.6300 mg | INHALATION_SOLUTION | Freq: Four times a day (QID) | RESPIRATORY_TRACT | Status: DC | PRN

## 2023-11-22 NOTE — ED Provider Triage Note (Signed)
 Emergency Medicine Provider Triage Evaluation Note  John Schwartz , a 28 y.o. male  was evaluated in triage.  Pt complains of SOB and cough x 1 day. Hx of asthma. Increase usage of albuterol  inhaler. Endorses sick contacts at home with son for similar symptoms.   Review of Systems  Positive:  Negative: fever  Physical Exam  BP (!) 125/92   Pulse (!) 109   Temp 98.9 F (37.2 C) (Oral)   Resp 20   SpO2 97%  Gen:   Awake, no distress   Resp:  increased effort; audible wheezing. Expiratory wheezing bilaterally with auscultation MSK:   Moves extremities without difficulty  Other:    Medical Decision Making  Medically screening exam initiated at 3:55 PM.  Appropriate orders placed.  Sascha O Treiber was informed that the remainder of the evaluation will be completed by another provider, this initial triage assessment does not replace that evaluation, and the importance of remaining in the ED until their evaluation is complete.     Margrette, Cully Luckow A, PA-C 11/22/23 1556

## 2023-11-22 NOTE — Assessment & Plan Note (Signed)
 Sinus tachycardia secondary to medication - Will try Xopenex every 6 and as needed wheezing -IV steroids -Close monitoring for clinical deterioration -Might benefit from outpatient referral to pulmonology

## 2023-11-22 NOTE — ED Provider Notes (Signed)
 [facility] Provider Note    Event Date/Time   First MD Initiated Contact with Patient 11/22/23 1848     (approximate)   History   Cough and Shortness of Breath   HPI  John Schwartz is a 28 y.o. male history of severe asthma prior intubations BiPAP  Recently discharged after severe asthma exacerbation requiring BiPAP  Reports he ran out of prednisone  about 3 days ago started developing wheezing cough and shortness of breath again along with chest tightness as is typical.  No fevers or chills no abdominal pain.  Reports same symptoms multiple times with asthma.  No history of thromboembolism or PE  Patient reports moderate to severe shortness of breath worsening over the course of the day has used for DuoNebs on top of 1 another prior to coming to the ER and 1 additional when he got here     Physical Exam   Triage Vital Signs: ED Triage Vitals  Encounter Vitals Group     BP 11/22/23 1552 (!) 125/92     Systolic BP Percentile --      Diastolic BP Percentile --      Pulse Rate 11/22/23 1552 (!) 109     Resp 11/22/23 1552 20     Temp 11/22/23 1552 98.9 F (37.2 C)     Temp Source 11/22/23 1552 Oral     SpO2 11/22/23 1552 97 %     Weight --      Height --      Head Circumference --      Peak Flow --      Pain Score 11/22/23 1553 0     Pain Loc --      Pain Education --      Exclude from Growth Chart --     Most recent vital signs: Vitals:   11/22/23 1921 11/22/23 1949  BP: (!) 142/99   Pulse: (!) 114   Resp: 13   Temp:    SpO2: 100% 100%     General: Awake, tachypnea with accessory muscle use and prolonged expiratory phase.  Audible wheezing noted on expiration even without auscultating the lungs CV:  Good peripheral perfusion.  Tachycardic normal tones Resp:  Moderate increased work of breathing no tripoding or fatigue.  Moderate bilateral diffuse expiratory wheezing without rales in all lung zones.  Accessory muscle use is  present Abd:  No distention.  Other:  No lower extremity edema venous cords or congestion   ED Results / Procedures / Treatments   Labs (all labs ordered are listed, but only abnormal results are displayed) Labs Reviewed  RESP PANEL BY RT-PCR (RSV, FLU A&B, COVID)  [id number]  CBC  BASIC METABOLIC PANEL     EKG  A inter by me at 2005 heart rate 105 QRS 80 QTc 460 Sinus tachycardia no evidence of acute ischemia   RADIOLOGY Chest x-ray inter by me is negative for acute    PROCEDURES:  Critical Care performed: Yes, see critical care procedure note(s)  Procedures   MEDICATIONS ORDERED IN ED: Medications  magnesium  sulfate IVPB 2 g 50 mL (2 g Intravenous New Bag/Given 11/22/23 1916)  ipratropium-albuterol  (DUONEB) 0.5-2.5 (3) MG/3ML nebulizer solution 3 mL (3 mLs Nebulization Given 11/22/23 1601)  albuterol  (PROVENTIL ) (2.5 MG/3ML) 0.083% nebulizer solution 2.5 mg (2.5 mg Nebulization Given 11/22/23 1601)  methylPREDNISolone  sodium succinate (SOLU-MEDROL ) 125 mg/2 mL injection 125 mg (125 mg Intravenous Given 11/22/23 1914)  ipratropium-albuterol  (DUONEB) 0.5-2.5 (3) MG/3ML nebulizer solution  3 mL (3 mLs Nebulization Given 11/22/23 1914)  ipratropium-albuterol  (DUONEB) 0.5-2.5 (3) MG/3ML nebulizer solution 3 mL (3 mLs Nebulization Given 11/22/23 1914)  albuterol  (PROVENTIL ,VENTOLIN ) solution continuous neb (7.5 mg/hr Nebulization Given 11/22/23 1947)     IMPRESSION / MDM / ASSESSMENT AND PLAN / ED COURSE  I reviewed the triage vital signs and the nursing notes.                              Differential diagnosis includes, but is not limited to, asthma exacerbation, status asthmaticus, viral syndrome, pneumothorax, COPD etc.  No chest pain no pleurisy, history of longstanding asthma with severe exacerbations.  No findings clinically to be highly suspicious for thromboembolism, and his clinical exam matches that of status asthmaticus.  He has moderate to severe shortness of breath, will  treat with nebulizers aggressively steroids magnesium   Patient's presentation is most consistent with acute presentation with potential threat to life or bodily function.   The patient is on the cardiac monitor to evaluate for evidence of arrhythmia and/or significant heart rate changes.     ----------------------------------------- 7:42 PM on 11/22/2023 ----------------------------------------- Patient condition slowly improving.  Has his accessory muscle use has improved he is sitting up more comfortably.  Still having diffuse expiratory wheezing and mild increased work of breathing but markedly better than on presentation.  Currently on continuous albuterol  therapy  Symptoms appear to be slowly improving but still with expiratory wheezing mild accessory muscle use though notably improved.  Discussed with patient and given his high risk status asthmaticus, will admit for further care and treatment.  Patient understand agreeable with plan  Consulted with and patient accepted to hospitalist by Dr. Cleatus  FINAL CLINICAL IMPRESSION(S) / ED DIAGNOSES   Final diagnoses:  Moderate persistent asthma with status asthmaticus     Rx / DC Orders   ED Discharge Orders     None        Note:  This document was prepared using Dragon voice recognition software and may include unintentional dictation errors.   Dicky Anes, MD 11/22/23 2007

## 2023-11-22 NOTE — ED Notes (Signed)
 Pt admitted to [facility]

## 2023-11-22 NOTE — ED Notes (Signed)
 RT called regarding pending albuterol tx

## 2023-11-22 NOTE — ED Triage Notes (Signed)
 Pt has asthma and c/o increasing SOB worsening overnight. Pt reports home nebulizer hasn't really helped. Pt has used his rescue inhaler 6 times today. Pt has had previous intubations due to asthma exacerbation.

## 2023-11-22 NOTE — H&P (Signed)
 History and Physical    Patient: John Schwartz FMW:[id number] DOB: Dec 19, 1995 DOA: 11/22/2023 DOS: the patient was seen and examined on 11/22/2023 PCP: Pcp, No  Patient coming from: Home  Chief Complaint:  Chief Complaint  Patient presents with   Cough   Shortness of Breath    HPI: John Schwartz is a 28 y.o. male with medical history significant for Severe asthma with multiple recent hospitalizations requiring BiPAP (no prior intubation), most recently from 12/7 to 12/8 who presents to the ED with wheezing typical of his asthma attacks, not responding to  multiple home nebulizer treatments x 6.  He reports compliance with his inhalers..  He denies cough, fever or chills or chest pain.   ED course and data review: Persistently tachycardic to the 130s to 140s with otherwise normal vitals.  No hypoxia Workup was unremarkable with negative respiratory viral panel, normal CBC and BMP and clear chest x-ray. Patient given multiple DuoNebs as well as Solu-Medrol  and magnesium  but continued to have increased work of breathing. Hospitalist consulted for admission.   Review of Systems: As mentioned in the history of present illness. All other systems reviewed and are negative.  Past Medical History:  Diagnosis Date   Asthma    Bronchospasm    Past Surgical History:  Procedure Laterality Date   NO PAST SURGERIES     Social History:  reports that he has never smoked. He has never used smokeless tobacco. He reports that he does not currently use drugs after having used the following drugs: Marijuana. He reports that he does not drink alcohol.  No Known Allergies  Family History  Problem Relation Age of Onset   Arthritis Paternal Grandmother     Prior to Admission medications   Medication Sig Start Date End Date Taking? Authorizing Provider  acetaminophen  (TYLENOL ) 325 MG tablet Take 2 tablets (650 mg total) by mouth every 6 (six) hours as needed for mild pain (or Fever >/= 101). 05/27/19   Yes Vachhani, Vaibhavkumar, MD  albuterol  (VENTOLIN  HFA) 108 (90 Base) MCG/ACT inhaler Inhale 2 puffs into the lungs every 4 (four) hours as needed for wheezing or shortness of breath. 10/24/23  Yes Wouk, Devaughn Sayres, MD  Fluticasone -Salmeterol (ADVAIR) 500-50 MCG/DOSE AEPB Inhale 1 puff into the lungs 2 (two) times daily. 09/11/23 12/10/23 Yes Djan, Drue DASEN, MD  ipratropium-albuterol  (DUONEB) 0.5-2.5 (3) MG/3ML SOLN Take 3 mLs by nebulization every 6 (six) hours as needed. Up to 4 times daily if needed for wheezing or shortness of breath. Patient taking differently: Take 3 mLs by nebulization 4 (four) times daily as needed (wheezing). Up to 4 times daily if needed for wheezing or shortness of breath. 11/19/21  Yes Awanda [place], MD  predniSONE  (DELTASONE ) 10 MG tablet 4 tabs daily for 3 days then 3 tabs daily for 3 days then 2 tabs daily for 3 days then 1 tab daily for 3 days Patient not taking: Reported on 11/22/2023 10/24/23   Kandis Devaughn Sayres, MD    Physical Exam: Vitals:   11/22/23 2000 11/22/23 2015 11/22/23 2030 11/22/23 2045  BP: (!) 152/102  (!) 167/105   Pulse: (!) 115 (!) 131 (!) 136 (!) 142  Resp: 11 15 17 13   Temp:      TempSrc:      SpO2: 95% 98% 96% 100%   Physical Exam Vitals and nursing note reviewed.  Constitutional:      General: He is not in acute distress. HENT:     Head:  Normocephalic and atraumatic.  Cardiovascular:     Rate and Rhythm: Regular rhythm. Tachycardia present.     Heart sounds: Normal heart sounds.  Pulmonary:     Effort: Tachypnea present.     Breath sounds: Wheezing present.  Abdominal:     Palpations: Abdomen is soft.     Tenderness: There is no abdominal tenderness.  Neurological:     Mental Status: Mental status is at baseline.     Labs on Admission: I have personally reviewed following labs and imaging studies  CBC: Recent Labs  Lab 11/22/23 1920  WBC 9.8  HGB 14.5  HCT 43.3  MCV 86.9  PLT 280   Basic Metabolic Panel: Recent Labs   Lab 11/22/23 1920  NA 137  K 3.7  CL 102  CO2 23  GLUCOSE 85  BUN 11  CREATININE 1.20  CALCIUM 9.2   GFR: CrCl cannot be calculated (Unknown ideal weight.). Liver Function Tests: No results for input(s): AST, ALT, ALKPHOS, BILITOT, PROT, ALBUMIN in the last 168 hours. No results for input(s): LIPASE, AMYLASE in the last 168 hours. No results for input(s): AMMONIA in the last 168 hours. Coagulation Profile: No results for input(s): INR, PROTIME in the last 168 hours. Cardiac Enzymes: No results for input(s): CKTOTAL, CKMB, CKMBINDEX, TROPONINI in the last 168 hours. BNP (last 3 results) No results for input(s): PROBNP in the last 8760 hours. HbA1C: No results for input(s): HGBA1C in the last 72 hours. CBG: No results for input(s): GLUCAP in the last 168 hours. Lipid Profile: No results for input(s): CHOL, HDL, LDLCALC, TRIG, CHOLHDL, LDLDIRECT in the last 72 hours. Thyroid  Function Tests: No results for input(s): TSH, T4TOTAL, FREET4, T3FREE, THYROIDAB in the last 72 hours. Anemia Panel: No results for input(s): VITAMINB12, FOLATE, FERRITIN, TIBC, IRON, RETICCTPCT in the last 72 hours. Urine analysis: No results found for: COLORURINE, APPEARANCEUR, LABSPEC, PHURINE, GLUCOSEU, HGBUR, BILIRUBINUR, KETONESUR, PROTEINUR, UROBILINOGEN, NITRITE, LEUKOCYTESUR  Radiological Exams on Admission: DG Chest 2 View Result Date: 11/22/2023 CLINICAL DATA:  Shortness of breath, asthma EXAM: CHEST - 2 VIEW COMPARISON:  10/23/2023 FINDINGS: The heart size and mediastinal contours are within normal limits. Both lungs are clear. The visualized skeletal structures are unremarkable. IMPRESSION: No active cardiopulmonary disease. Electronically Signed   By: CHRISTELLA.  Shick M.D.   On: 11/22/2023 16:42     Data Reviewed: Relevant notes from primary care and specialist visits, past discharge summaries as available  in EHR, including Care Everywhere. Prior diagnostic testing as pertinent to current admission diagnoses Updated medications and problem lists for reconciliation ED course, including vitals, labs, imaging, treatment and response to treatment Triage notes, nursing and pharmacy notes and ED provider's notes Notable results as noted in HPI   Assessment and Plan: * Acute exacerbation of severe persistent extrinsic asthma Sinus tachycardia secondary to medication - Will try Xopenex  every 6 and as needed wheezing -IV steroids -Close monitoring for clinical deterioration -Might benefit from outpatient referral to pulmonology     DVT prophylaxis: Lovenox   Consults: none  Advance Care Planning:   Code Status: Prior   Family Communication: none  Disposition Plan: Back to previous home environment  Severity of Illness: The appropriate patient status for this patient is OBSERVATION. Observation status is judged to be reasonable and necessary in order to provide the required intensity of service to ensure the patient's safety. The patient's presenting symptoms, physical exam findings, and initial radiographic and laboratory data in the context of their medical condition is felt to  place them at decreased risk for further clinical deterioration. Furthermore, it is anticipated that the patient will be medically stable for discharge from the hospital within 2 midnights of admission.   Author: Delayne LULLA Solian, MD 11/22/2023 9:20 PM  For on call review [email or web address].

## 2023-11-23 DIAGNOSIS — J4542 Moderate persistent asthma with status asthmaticus: Secondary | ICD-10-CM | POA: Diagnosis not present

## 2023-11-23 DIAGNOSIS — J4551 Severe persistent asthma with (acute) exacerbation: Secondary | ICD-10-CM | POA: Diagnosis not present

## 2023-11-23 MED ORDER — IPRATROPIUM-ALBUTEROL 0.5-2.5 (3) MG/3ML IN SOLN: 3.0000 mL | Freq: Four times a day (QID) | RESPIRATORY_TRACT | 2 refills | Status: AC | PRN

## 2023-11-23 MED ORDER — FLUTICASONE-SALMETEROL 500-50 MCG/DOSE IN AEPB: 1.0000 | INHALATION_SPRAY | Freq: Two times a day (BID) | RESPIRATORY_TRACT | 2 refills | Status: DC

## 2023-11-23 MED ORDER — PREDNISONE 20 MG PO TABS: 40.0000 mg | ORAL_TABLET | Freq: Every day | ORAL | 0 refills | Status: AC

## 2023-11-23 MED ORDER — ALBUTEROL SULFATE HFA 108 (90 BASE) MCG/ACT IN AERS: 2.0000 | INHALATION_SPRAY | RESPIRATORY_TRACT | 2 refills | Status: DC | PRN

## 2023-11-23 NOTE — Discharge Summary (Signed)
 Physician Discharge Summary   Patient: John Schwartz MRN: [id number] DOB: 11/19/1995  Admit date:     11/22/2023  Discharge date: 11/23/23  Discharge Physician: Amaryllis Dare   PCP: Pcp, No   Recommendations at discharge:  Please obtain CBC and BMP on follow-up Follow-up with pulmonary within a week Follow-up with primary care provider  Discharge Diagnoses: Principal Problem:   Acute exacerbation of severe persistent extrinsic asthma   Hospital Course: Taken from H&P.  John Schwartz is a 28 y.o. male with medical history significant for Severe asthma with multiple recent hospitalizations requiring BiPAP (no prior intubation), most recently from 12/7 to 12/8 who presents to the ED with wheezing typical of his asthma attacks, not responding to  multiple home nebulizer treatments x 6.  He reports compliance with his inhalers.   On presentation patient was tachycardic otherwise normal vital.  No hypoxia.  Workup mostly unremarkable with negative respiratory viral panel and chest x-ray.  Patient received multiple doses of DuoNeb along with steroid and magnesium  but continued to have increased work of breathing.  1/7: Patient with significant improvement and think that he is at his baseline.  Continue to have very mild scant wheeze.  On room air.  Blood pressure he is having this persistent asthma for which he sees [facility] and would like to switch to a different pulmonologist.  He was provided referral for [facility].  Patient was provided with a new prescription for his Advair diskus and given a 5-day course of steroid after discussing with pulmonology.  He was requesting discharge as he need to go pick up his son from school.  Patient is being discharged on 5 days of prednisone  along with ICS and albuterol .  He will continue on current medications and will follow-up closely with pulmonary for further assistance.  He will remain high risk for readmission due to these  recurrent episodes.    Assessment and Plan: * Acute exacerbation of severe persistent extrinsic asthma Sinus tachycardia secondary to medication - Will try Xopenex  every 6 and as needed wheezing -IV steroids-switched to prednisone  -Close monitoring for clinical deterioration - Patient was provided with outpatient pulmonary referral  Consultants: None Procedures performed: None Disposition: Home Diet recommendation:  Discharge Diet Orders (From admission, onward)     Start     Ordered   11/23/23 0000  Diet - low sodium heart healthy        11/23/23 1248           Regular diet DISCHARGE MEDICATION: Allergies as of 11/23/2023   No Known Allergies      Medication List     TAKE these medications    acetaminophen  325 MG tablet Commonly known as: TYLENOL  Take 2 tablets (650 mg total) by mouth every 6 (six) hours as needed for mild pain (or Fever >/= 101).   albuterol  108 (90 Base) MCG/ACT inhaler Commonly known as: VENTOLIN  HFA Inhale 2 puffs into the lungs every 4 (four) hours as needed for wheezing or shortness of breath.   Fluticasone -Salmeterol 500-50 MCG/DOSE Aepb Commonly known as: ADVAIR Inhale 1 puff into the lungs 2 (two) times daily.   ipratropium-albuterol  0.5-2.5 (3) MG/3ML Soln Commonly known as: DUONEB Take 3 mLs by nebulization every 6 (six) hours as needed. Up to 4 times daily if needed for wheezing or shortness of breath. What changed:  when to take this reasons to take this   predniSONE  20 MG tablet Commonly known as: DELTASONE  Take 2 tablets (40  mg total) by mouth daily with breakfast for 5 days. Start taking on: November 24, 2023 What changed:  medication strength how much to take how to take this when to take this additional instructions        Discharge Exam: Filed Weights   11/22/23 2124  Weight: 90 kg   General.  Well-developed gentleman, in no acute distress. Pulmonary.  Scant scattered wheeze bilaterally, normal respiratory  effort. CV.  Regular rate and rhythm, no JVD, rub or murmur. Abdomen.  Soft, nontender, nondistended, BS positive. CNS.  Alert and oriented .  No focal neurologic deficit. Extremities.  No edema, no cyanosis, pulses intact and symmetrical. Psychiatry.  Judgment and insight appears normal.   Condition at discharge: stable  The results of significant diagnostics from this hospitalization (including imaging, microbiology, ancillary and laboratory) are listed below for reference.   Imaging Studies: DG Chest 2 View Result Date: 11/22/2023 CLINICAL DATA:  Shortness of breath, asthma EXAM: CHEST - 2 VIEW COMPARISON:  10/23/2023 FINDINGS: The heart size and mediastinal contours are within normal limits. Both lungs are clear. The visualized skeletal structures are unremarkable. IMPRESSION: No active cardiopulmonary disease. Electronically Signed   By: CHRISTELLA.  Shick M.D.   On: 11/22/2023 16:42    Microbiology: Results for orders placed or performed during the hospital encounter of 11/22/23  Resp panel by RT-PCR (RSV, Flu A&B, Covid) Anterior Nasal Swab     Status: None   Collection Time: 11/22/23  3:28 PM   Specimen: Anterior Nasal Swab  Result Value Ref Range Status   SARS Coronavirus 2 by RT PCR NEGATIVE NEGATIVE Final    Comment: (NOTE) SARS-CoV-2 target nucleic acids are NOT DETECTED.  The SARS-CoV-2 RNA is generally detectable in upper respiratory specimens during the acute phase of infection. The lowest concentration of SARS-CoV-2 viral copies this assay can detect is 138 copies/mL. A negative result does not preclude SARS-Cov-2 infection and should not be used as the sole basis for treatment or other patient management decisions. A negative result may occur with  improper specimen collection/handling, submission of specimen other than nasopharyngeal swab, presence of viral mutation(s) within the areas targeted by this assay, and inadequate number of viral copies(<138 copies/mL). A negative  result must be combined with clinical observations, patient history, and epidemiological information. The expected result is Negative.  Fact Sheet for Patients:  bloggercourse.com  Fact Sheet for Healthcare Providers:  seriousbroker.it  This test is no t yet approved or cleared by the [place]  FDA and  has been authorized for detection and/or diagnosis of SARS-CoV-2 by FDA under an Emergency Use Authorization (EUA). This EUA will remain  in effect (meaning this test can be used) for the duration of the COVID-19 declaration under Section 564(b)(1) of the Act, 21 U.S.C.section 360bbb-3(b)(1), unless the authorization is terminated  or revoked sooner.       Influenza A by PCR NEGATIVE NEGATIVE Final   Influenza B by PCR NEGATIVE NEGATIVE Final    Comment: (NOTE) The Xpert Xpress SARS-CoV-2/FLU/RSV plus assay is intended as an aid in the diagnosis of influenza from Nasopharyngeal swab specimens and should not be used as a sole basis for treatment. Nasal washings and aspirates are unacceptable for Xpert Xpress SARS-CoV-2/FLU/RSV testing.  Fact Sheet for Patients: bloggercourse.com  Fact Sheet for Healthcare Providers: seriousbroker.it  This test is not yet approved or cleared by the [place]  FDA and has been authorized for detection and/or diagnosis of SARS-CoV-2 by FDA under an Emergency Use Authorization (EUA).  This EUA will remain in effect (meaning this test can be used) for the duration of the COVID-19 declaration under Section 564(b)(1) of the Act, 21 U.S.C. section 360bbb-3(b)(1), unless the authorization is terminated or revoked.     Resp Syncytial Virus by PCR NEGATIVE NEGATIVE Final    Comment: (NOTE) Fact Sheet for Patients: bloggercourse.com  Fact Sheet for Healthcare Providers: seriousbroker.it  This  test is not yet approved or cleared by the [place]  FDA and has been authorized for detection and/or diagnosis of SARS-CoV-2 by FDA under an Emergency Use Authorization (EUA). This EUA will remain in effect (meaning this test can be used) for the duration of the COVID-19 declaration under Section 564(b)(1) of the Act, 21 U.S.C. section 360bbb-3(b)(1), unless the authorization is terminated or revoked.  Performed at [facility], [address] [place], [place] [address]     Labs: CBC: Recent Labs  Lab 11/22/23 1920  WBC 9.8  HGB 14.5  HCT 43.3  MCV 86.9  PLT 280   Basic Metabolic Panel: Recent Labs  Lab 11/22/23 1920  NA 137  K 3.7  CL 102  CO2 23  GLUCOSE 85  BUN 11  CREATININE 1.20  CALCIUM 9.2   Liver Function Tests: No results for input(s): AST, ALT, ALKPHOS, BILITOT, PROT, ALBUMIN in the last 168 hours. CBG: No results for input(s): GLUCAP in the last 168 hours.  Discharge time spent: greater than 30 minutes.  This record has been created using Conservation officer, historic buildings. Errors have been sought and corrected,but may not always be located. Such creation errors do not reflect on the standard of care.   Signed: Amaryllis Dare, MD [facility] 11/23/2023

## 2023-11-23 NOTE — Hospital Course (Addendum)
 Taken from H&P.  John Schwartz is a 28 y.o. male with medical history significant for Severe asthma with multiple recent hospitalizations requiring BiPAP (no prior intubation), most recently from 12/7 to 12/8 who presents to the ED with wheezing typical of his asthma attacks, not responding to  multiple home nebulizer treatments x 6.  He reports compliance with his inhalers.   On presentation patient was tachycardic otherwise normal vital.  No hypoxia.  Workup mostly unremarkable with negative respiratory viral panel and chest x-ray.  Patient received multiple doses of DuoNeb along with steroid and magnesium  but continued to have increased work of breathing.  1/7: Patient with significant improvement and think that he is at his baseline.  Continue to have very mild scant wheeze.  On room air.  Blood pressure he is having this persistent asthma for which he sees [facility] and would like to switch to a different pulmonologist.  He was provided referral for [facility].  Patient was provided with a new prescription for his Advair diskus and given a 5-day course of steroid after discussing with pulmonology.  He was requesting discharge as he need to go pick up his son from school.  Patient is being discharged on 5 days of prednisone  along with ICS and albuterol .  He will continue on current medications and will follow-up closely with pulmonary for further assistance.  He will remain high risk for readmission due to these recurrent episodes.

## 2023-12-14 ENCOUNTER — Encounter: Payer: Self-pay | Admitting: Student in an Organized Health Care Education/Training Program

## 2023-12-14 ENCOUNTER — Ambulatory Visit (INDEPENDENT_AMBULATORY_CARE_PROVIDER_SITE_OTHER): Payer: Medicaid Other | Admitting: Student in an Organized Health Care Education/Training Program

## 2023-12-14 VITALS — BP 126/76 | HR 108 | Temp 97.6°F | Ht 70.0 in

## 2023-12-14 DIAGNOSIS — R0602 Shortness of breath: Secondary | ICD-10-CM | POA: Diagnosis not present

## 2023-12-14 DIAGNOSIS — J4550 Severe persistent asthma, uncomplicated: Secondary | ICD-10-CM | POA: Diagnosis not present

## 2023-12-14 LAB — NITRIC OXIDE: Nitric Oxide: 230

## 2023-12-14 MED ORDER — MONTELUKAST SODIUM 10 MG PO TABS: 10.0000 mg | ORAL_TABLET | Freq: Every day | ORAL | 12 refills | Status: AC

## 2023-12-14 MED ORDER — AEROCHAMBER MV MISC: 0 refills | Status: AC

## 2023-12-14 MED ORDER — FLUTICASONE-SALMETEROL 230-21 MCG/ACT IN AERO: 2.0000 | INHALATION_SPRAY | Freq: Two times a day (BID) | RESPIRATORY_TRACT | 12 refills | Status: DC

## 2023-12-14 NOTE — Progress Notes (Unsigned)
Synopsis: Referred in *** by Tresa Moore, MD  Assessment & Plan:   1. Shortness of breath *** - Nitric oxide  2. Severe persistent asthma without complication (Primary) *** - Allergen Panel (27) + IGE - Pulmonary Function Test [facility] Only; Future - Strongyloides, Ab, IgG; Future - Ova and parasite examination; Standing - CT CHEST HIGH RESOLUTION; Future - fluticasone-salmeterol (ADVAIR HFA) 230-21 MCG/ACT inhaler; Inhale 2 puffs into the lungs 2 (two) times daily.  Dispense: 120 each; Refill: 12 - Spacer/Aero-Holding Chambers (AEROCHAMBER MV) inhaler; Use as instructed  Dispense: 1 each; Refill: 0 - montelukast (SINGULAIR) 10 MG tablet; Take 1 tablet (10 mg total) by mouth daily.  Dispense: 30 tablet; Refill: 12   Return in about 2 weeks (around 12/28/2023).  I spent *** minutes caring for this patient today, including {EM billing:28027}  Raechel Chute, MD [facility] 12/14/2023 4:28 PM    End of visit medications:  Meds ordered this encounter  Medications   fluticasone-salmeterol (ADVAIR HFA) 230-21 MCG/ACT inhaler    Sig: Inhale 2 puffs into the lungs 2 (two) times daily.    Dispense:  120 each    Refill:  12   Spacer/Aero-Holding Chambers (AEROCHAMBER MV) inhaler    Sig: Use as instructed    Dispense:  1 each    Refill:  0   montelukast (SINGULAIR) 10 MG tablet    Sig: Take 1 tablet (10 mg total) by mouth daily.    Dispense:  30 tablet    Refill:  12     Current Outpatient Medications:    acetaminophen (TYLENOL) 325 MG tablet, Take 2 tablets (650 mg total) by mouth every 6 (six) hours as needed for mild pain (or Fever >/= 101)., Disp: 20 tablet, Rfl: 0   albuterol (VENTOLIN HFA) 108 (90 Base) MCG/ACT inhaler, Inhale 2 puffs into the lungs every 4 (four) hours as needed for wheezing or shortness of breath., Disp: 8 g, Rfl: 2   fluticasone-salmeterol (ADVAIR HFA) 230-21 MCG/ACT inhaler, Inhale 2 puffs into the lungs 2 (two) times daily.,  Disp: 120 each, Rfl: 12   ipratropium-albuterol (DUONEB) 0.5-2.5 (3) MG/3ML SOLN, Take 3 mLs by nebulization every 6 (six) hours as needed (For shortness of breath and wheezing). Up to 4 times daily if needed for wheezing or shortness of breath., Disp: 90 mL, Rfl: 2   montelukast (SINGULAIR) 10 MG tablet, Take 1 tablet (10 mg total) by mouth daily., Disp: 30 tablet, Rfl: 12   predniSONE (DELTASONE) 20 MG tablet, Take 20 mg by mouth daily., Disp: , Rfl:    Spacer/Aero-Holding Chambers (AEROCHAMBER MV) inhaler, Use as instructed, Disp: 1 each, Rfl: 0   Subjective:   PATIENT ID: John Schwartz GENDER: male DOB: 1996-02-14, MRN: [id number]  Chief Complaint  Patient presents with   Consult    Cough, shortness of breath and wheezing. Did use duoneb this morning. He reports albuterol and advair do not help with symptoms.     HPI  The patient is a pleasant 28 year old male with a past medical history of severe persistent asthma presenting today to establish care.  Patient has a longstanding history of severe persistent asthma diagnosed as a child.  He was previously followed with pediatric pulmonology and subsequently adult pulmonology at [facility].  He has had multiple exacerbations over the past 10 years and is currently maintained on chronic prednisone 20 mg daily in addition to high-dose Advair 500-50 (started October 2024).  He reports compliance with Advair  and using it regularly twice daily.  He denies any occupational or inhalational exposures.  He is unaware of any allergies.  He lives at home with his family, having lived in the same house for the past 4 years.  The house has hardwood floors and no carpet.  They do not have any pets. Patient is keenly aware of the severity of his asthma and highly understanding of tachyphylaxis with the use of albuterol.  He is concerned that with his asthma being uncontrolled but he would end up in the hospital again.  He is inquiring about the potential for  biologic therapy for asthma.  Patient has an extensive medical history of asthma with innumerable presentations to providers, urgent care, the ED, and hospitalizations for uncontrolled asthma.  He was seen 5 times in 2024 in the hospital for uncontrolled asthma which included 3 admissions.  Review of the medical record notes persistently elevated eosinophil count with peak eosinophilia of 2300.  Nearly every CBC with differential he has had in the medical record is notable for the elevated eosinophilia.  Previous spirometry had shown a fixed obstructive defect but I do not have access to the flow-volume loops to review.  Allergen panel at [facility] in 2018 was overall negative with an IgE level of 49.9.  Aspergillus IgE in 2018 was similarly negative.  He was seen in our ED earlier this month and was admitted similarly for uncontrolled asthma.  He similarly had multiple presentations over the past few years and tells me that he has been maintained on prednisone for many many years.  Patient reports history of incarceration and ankle today reports that his asthma was better controlled while been present.  He denies any smoking history or any other inhalational drug use.  Ancillary information including prior medications, full medical/surgical/family/social histories, and PFTs (when available) are listed below and have been reviewed.   ROS   Objective:   Vitals:   12/14/23 1343  BP: 126/76  Pulse: (!) 108  Temp: 97.6 F (36.4 C)  TempSrc: Temporal  SpO2: 95%  Height: 5\' 10"  (1.778 m)   95% on *** LPM *** RA BMI Readings from Last 3 Encounters:  12/14/23 28.47 kg/m  11/22/23 28.47 kg/m  10/23/23 28.47 kg/m   Wt Readings from Last 3 Encounters:  11/22/23 198 lb 6.6 oz (90 kg)  10/23/23 198 lb 6.6 oz (90 kg)  09/16/23 197 lb 12 oz (89.7 kg)    Physical Exam    Ancillary Information    Past Medical History:  Diagnosis Date   Asthma    Bronchospasm      Family History   Problem Relation Age of Onset   Arthritis Paternal Grandmother      Past Surgical History:  Procedure Laterality Date   NO PAST SURGERIES      Social History   Socioeconomic History   Marital status: Single    Spouse name: Not on file   Number of children: Not on file   Years of education: Not on file   Highest education level: Not on file  Occupational History   Not on file  Tobacco Use   Smoking status: Never   Smokeless tobacco: Never  Vaping Use   Vaping status: Never Used  Substance and Sexual Activity   Alcohol use: No   Drug use: Not Currently    Types: Marijuana    Comment: Hx of smoking marajauna, last used over a year ago   Sexual activity: Yes  Birth control/protection: Condom  Other Topics Concern   Not on file  Social History Narrative   ** Merged History Encounter **       Lives with Mom 2 brothers and 1 sister (64, 26, 54)   Social Drivers of Corporate investment banker Strain: Not on file  Food Insecurity: No Food Insecurity (09/17/2023)   Hunger Vital Sign    Worried About Running Out of Food in the Last Year: Never true    Ran Out of Food in the Last Year: Never true  Transportation Needs: No Transportation Needs (09/17/2023)   PRAPARE - Administrator, Civil Service (Medical): No    Lack of Transportation (Non-Medical): No  Physical Activity: Not on file  Stress: Not on file  Social Connections: Not on file  Intimate Partner Violence: Not At Risk (09/17/2023)   Humiliation, Afraid, Rape, and Kick questionnaire    Fear of Current or Ex-Partner: No    Emotionally Abused: No    Physically Abused: No    Sexually Abused: No     No Known Allergies   CBC    Component Value Date/Time   WBC 9.8 11/22/2023 1920   RBC 4.98 11/22/2023 1920   HGB 14.5 11/22/2023 1920   HCT 43.3 11/22/2023 1920   PLT 280 11/22/2023 1920   MCV 86.9 11/22/2023 1920   MCH 29.1 11/22/2023 1920   MCHC 33.5 11/22/2023 1920   RDW 12.3 11/22/2023 1920    LYMPHSABS 2.8 10/23/2023 0347   MONOABS 0.9 10/23/2023 0347   EOSABS 1.2 (H) 10/23/2023 0347   BASOSABS 0.1 10/23/2023 0347    Pulmonary Functions Testing Results:     No data to display          Outpatient Medications Prior to Visit  Medication Sig Dispense Refill   acetaminophen (TYLENOL) 325 MG tablet Take 2 tablets (650 mg total) by mouth every 6 (six) hours as needed for mild pain (or Fever >/= 101). 20 tablet 0   albuterol (VENTOLIN HFA) 108 (90 Base) MCG/ACT inhaler Inhale 2 puffs into the lungs every 4 (four) hours as needed for wheezing or shortness of breath. 8 g 2   ipratropium-albuterol (DUONEB) 0.5-2.5 (3) MG/3ML SOLN Take 3 mLs by nebulization every 6 (six) hours as needed (For shortness of breath and wheezing). Up to 4 times daily if needed for wheezing or shortness of breath. 90 mL 2   predniSONE (DELTASONE) 20 MG tablet Take 20 mg by mouth daily.     Fluticasone-Salmeterol (ADVAIR) 500-50 MCG/DOSE AEPB Inhale 1 puff into the lungs 2 (two) times daily. 60 each 2   No facility-administered medications prior to visit.

## 2023-12-14 NOTE — Patient Instructions (Addendum)
Today, I ordered blood work and stool studies. You can get them draw at your preferred [facility] draw station. The nearest one to [facility] is at nearby [facility] ([address] [place], [place], [place] [address]).  Will start advair HFA 230 - two puffs twice daily with spacer device Will start singulair one pill daily Will get repeat breathing tests (PFT) and a CT scan of your chest as part of our workup into your asthma.

## 2023-12-15 ENCOUNTER — Emergency Department: Payer: Medicaid Other

## 2023-12-15 ENCOUNTER — Telehealth: Payer: Self-pay

## 2023-12-15 ENCOUNTER — Observation Stay
Admission: EM | Admit: 2023-12-15 | Discharge: 2023-12-15 | Discharge status: Against Medical Advice | Payer: Medicaid Other | Attending: Student | Admitting: Student

## 2023-12-15 ENCOUNTER — Other Ambulatory Visit: Payer: Self-pay

## 2023-12-15 ENCOUNTER — Other Ambulatory Visit (HOSPITAL_COMMUNITY): Payer: Self-pay

## 2023-12-15 DIAGNOSIS — J9601 Acute respiratory failure with hypoxia: Principal | ICD-10-CM

## 2023-12-15 DIAGNOSIS — R7989 Other specified abnormal findings of blood chemistry: Secondary | ICD-10-CM | POA: Diagnosis not present

## 2023-12-15 DIAGNOSIS — Z5329 Procedure and treatment not carried out because of patient's decision for other reasons: Secondary | ICD-10-CM | POA: Diagnosis present

## 2023-12-15 DIAGNOSIS — E876 Hypokalemia: Secondary | ICD-10-CM | POA: Diagnosis present

## 2023-12-15 DIAGNOSIS — A419 Sepsis, unspecified organism: Secondary | ICD-10-CM

## 2023-12-15 DIAGNOSIS — Z7951 Long term (current) use of inhaled steroids: Secondary | ICD-10-CM | POA: Diagnosis not present

## 2023-12-15 DIAGNOSIS — Z1152 Encounter for screening for COVID-19: Secondary | ICD-10-CM

## 2023-12-15 DIAGNOSIS — Z79899 Other long term (current) drug therapy: Secondary | ICD-10-CM

## 2023-12-15 DIAGNOSIS — R0603 Acute respiratory distress: Principal | ICD-10-CM

## 2023-12-15 DIAGNOSIS — J4551 Severe persistent asthma with (acute) exacerbation: Secondary | ICD-10-CM

## 2023-12-15 LAB — BASIC METABOLIC PANEL
Anion gap: 15 (ref 5–15)
BUN: 10 mg/dL (ref 6–20)
CO2: 22 mmol/L (ref 22–32)
Calcium: 8.3 mg/dL — ABNORMAL LOW (ref 8.9–10.3)
Chloride: 103 mmol/L (ref 98–111)
Creatinine, Ser: 1.13 mg/dL (ref 0.61–1.24)
GFR, Estimated: >60 mL/min (ref >60)
Glucose, Bld: 144 mg/dL — ABNORMAL HIGH (ref 70–99)
Potassium: 3.6 mmol/L (ref 3.5–5.1)
Sodium: 140 mmol/L (ref 135–145)

## 2023-12-15 LAB — COMPREHENSIVE METABOLIC PANEL
ALT: 46 U/L — ABNORMAL HIGH (ref 0–44)
AST: 24 U/L (ref 15–41)
Albumin: 3.9 g/dL (ref 3.5–5.0)
Alkaline Phosphatase: 33 U/L — ABNORMAL LOW (ref 38–126)
Anion gap: 11 (ref 5–15)
BUN: 11 mg/dL (ref 6–20)
CO2: 22 mmol/L (ref 22–32)
Calcium: 8.7 mg/dL — ABNORMAL LOW (ref 8.9–10.3)
Chloride: 107 mmol/L (ref 98–111)
Creatinine, Ser: 1.04 mg/dL (ref 0.61–1.24)
GFR, Estimated: >60 mL/min (ref >60)
Glucose, Bld: 134 mg/dL — ABNORMAL HIGH (ref 70–99)
Potassium: 3.4 mmol/L — ABNORMAL LOW (ref 3.5–5.1)
Sodium: 140 mmol/L (ref 135–145)
Total Bilirubin: 0.6 mg/dL (ref 0.0–1.2)
Total Protein: 6.6 g/dL (ref 6.5–8.1)

## 2023-12-15 LAB — CBC WITH DIFFERENTIAL/PLATELET
Abs Immature Granulocytes: 0.05 10*3/uL (ref 0.00–0.07)
Basophils Absolute: 0.1 10*3/uL (ref 0.0–0.1)
Basophils Relative: 1 %
Eosinophils Absolute: 0.6 10*3/uL — ABNORMAL HIGH (ref 0.0–0.5)
Eosinophils Relative: 7 %
HCT: 40.6 % (ref 39.0–52.0)
Hemoglobin: 13.9 g/dL (ref 13.0–17.0)
Immature Granulocytes: 1 %
Lymphocytes Relative: 19 %
Lymphs Abs: 1.6 10*3/uL (ref 0.7–4.0)
MCH: 29.1 pg (ref 26.0–34.0)
MCHC: 34.2 g/dL (ref 30.0–36.0)
MCV: 85.1 fL (ref 80.0–100.0)
Monocytes Absolute: 0.5 10*3/uL (ref 0.1–1.0)
Monocytes Relative: 5 %
Neutro Abs: 6 10*3/uL (ref 1.7–7.7)
Neutrophils Relative %: 67 %
Platelets: 255 10*3/uL (ref 150–400)
RBC: 4.77 MIL/uL (ref 4.22–5.81)
RDW: 12.2 % (ref 11.5–15.5)
WBC: 8.7 10*3/uL (ref 4.0–10.5)
nRBC: 0 % (ref 0.0–0.2)

## 2023-12-15 LAB — RESPIRATORY PANEL BY PCR

## 2023-12-15 LAB — CBC
HCT: 39.6 % (ref 39.0–52.0)
Hemoglobin: 13.2 g/dL (ref 13.0–17.0)
MCH: 28.7 pg (ref 26.0–34.0)
MCHC: 33.3 g/dL (ref 30.0–36.0)
MCV: 86.1 fL (ref 80.0–100.0)
Platelets: 257 10*3/uL (ref 150–400)
RBC: 4.6 MIL/uL (ref 4.22–5.81)
RDW: 12.2 % (ref 11.5–15.5)
WBC: 7.8 10*3/uL (ref 4.0–10.5)
nRBC: 0 % (ref 0.0–0.2)

## 2023-12-15 LAB — RESP PANEL BY RT-PCR (RSV, FLU A&B, COVID)  RVPGX2
Influenza A by PCR: NEGATIVE
Influenza B by PCR: NEGATIVE
Resp Syncytial Virus by PCR: NEGATIVE
SARS Coronavirus 2 by RT PCR: NEGATIVE

## 2023-12-15 LAB — VITAMIN D 25 HYDROXY (VIT D DEFICIENCY, FRACTURES): Vit D, 25-Hydroxy: 11.31 ng/mL — ABNORMAL LOW (ref 30–100)

## 2023-12-15 LAB — PROCALCITONIN: Procalcitonin: <0.1 ng/mL

## 2023-12-15 LAB — TROPONIN I (HIGH SENSITIVITY)
Troponin I (High Sensitivity): 3 ng/L (ref <18)
Troponin I (High Sensitivity): 4 ng/L (ref <18)

## 2023-12-15 LAB — LACTIC ACID, PLASMA
Lactic Acid, Venous: 2 mmol/L (ref 0.5–1.9)
Lactic Acid, Venous: 2.4 mmol/L (ref 0.5–1.9)
Lactic Acid, Venous: 3.7 mmol/L (ref 0.5–1.9)

## 2023-12-15 LAB — MAGNESIUM: Magnesium: 3.7 mg/dL — ABNORMAL HIGH (ref 1.7–2.4)

## 2023-12-15 LAB — PHOSPHORUS: Phosphorus: 2.6 mg/dL (ref 2.5–4.6)

## 2023-12-15 MED ORDER — SODIUM CHLORIDE 0.9 % IV BOLUS (SEPSIS)
1000.0000 mL | Freq: Once | INTRAVENOUS | Status: AC
  Administered 2023-12-15: 1000 mL via INTRAVENOUS

## 2023-12-15 MED ORDER — SODIUM CHLORIDE 0.9 % IV BOLUS (SEPSIS): 1000.0000 mL | Freq: Once | INTRAVENOUS | Status: DC

## 2023-12-15 MED ORDER — ONDANSETRON HCL 4 MG PO TABS: 4.0000 mg | ORAL_TABLET | Freq: Four times a day (QID) | ORAL | Status: DC | PRN

## 2023-12-15 MED ORDER — ENOXAPARIN SODIUM 40 MG/0.4ML IJ SOSY
40.0000 mg | PREFILLED_SYRINGE | INTRAMUSCULAR | Status: DC
  Filled 2023-12-15: qty 0.4

## 2023-12-15 MED ORDER — MAGNESIUM HYDROXIDE 400 MG/5ML PO SUSP: 30.0000 mL | Freq: Every day | ORAL | Status: DC | PRN

## 2023-12-15 MED ORDER — HYDROCOD POLI-CHLORPHE POLI ER 10-8 MG/5ML PO SUER: 5.0000 mL | Freq: Two times a day (BID) | ORAL | Status: DC | PRN

## 2023-12-15 MED ORDER — ALBUTEROL (5 MG/ML) CONTINUOUS INHALATION SOLN
10.0000 mg/h | INHALATION_SOLUTION | Freq: Once | RESPIRATORY_TRACT | Status: DC
  Filled 2023-12-15: qty 20

## 2023-12-15 MED ORDER — ALBUTEROL SULFATE (2.5 MG/3ML) 0.083% IN NEBU
10.0000 mg | INHALATION_SOLUTION | Freq: Once | RESPIRATORY_TRACT | Status: AC
  Administered 2023-12-15: 10 mg via RESPIRATORY_TRACT
  Filled 2023-12-15: qty 12

## 2023-12-15 MED ORDER — TRAZODONE HCL 50 MG PO TABS
25.0000 mg | ORAL_TABLET | Freq: Every evening | ORAL | Status: DC | PRN
Start: 2023-12-15 — End: 2023-12-15

## 2023-12-15 MED ORDER — GUAIFENESIN ER 600 MG PO TB12
600.0000 mg | ORAL_TABLET | Freq: Two times a day (BID) | ORAL | Status: DC
  Administered 2023-12-15: 600 mg via ORAL
  Filled 2023-12-15: qty 1

## 2023-12-15 MED ORDER — MONTELUKAST SODIUM 10 MG PO TABS
10.0000 mg | ORAL_TABLET | Freq: Every day | ORAL | Status: DC
  Administered 2023-12-15: 10 mg via ORAL
  Filled 2023-12-15: qty 1

## 2023-12-15 MED ORDER — ONDANSETRON HCL 4 MG/2ML IJ SOLN: 4.0000 mg | Freq: Four times a day (QID) | INTRAMUSCULAR | Status: DC | PRN

## 2023-12-15 MED ORDER — ACETAMINOPHEN 650 MG RE SUPP: 650.0000 mg | Freq: Four times a day (QID) | RECTAL | Status: DC | PRN

## 2023-12-15 MED ORDER — CEFTRIAXONE SODIUM 2 G IJ SOLR
2.0000 g | Freq: Once | INTRAMUSCULAR | Status: AC
  Administered 2023-12-15: 2 g via INTRAVENOUS
  Filled 2023-12-15: qty 20

## 2023-12-15 MED ORDER — IPRATROPIUM-ALBUTEROL 0.5-2.5 (3) MG/3ML IN SOLN
3.0000 mL | Freq: Four times a day (QID) | RESPIRATORY_TRACT | Status: DC
  Administered 2023-12-15: 3 mL via RESPIRATORY_TRACT
  Filled 2023-12-15: qty 3

## 2023-12-15 MED ORDER — FLUTICASONE FUROATE-VILANTEROL 200-25 MCG/ACT IN AEPB
1.0000 | INHALATION_SPRAY | Freq: Every day | RESPIRATORY_TRACT | Status: DC
Start: 2023-12-15 — End: 2023-12-15
  Administered 2023-12-15: 1 via RESPIRATORY_TRACT
  Filled 2023-12-15: qty 28

## 2023-12-15 MED ORDER — ACETAMINOPHEN 325 MG PO TABS: 650.0000 mg | ORAL_TABLET | Freq: Four times a day (QID) | ORAL | Status: DC | PRN

## 2023-12-15 MED ORDER — SODIUM CHLORIDE 0.9 % IV BOLUS
1000.0000 mL | Freq: Once | INTRAVENOUS | Status: AC
  Administered 2023-12-15: 1000 mL via INTRAVENOUS

## 2023-12-15 MED ORDER — MAGNESIUM SULFATE 2 GM/50ML IV SOLN
2.0000 g | Freq: Once | INTRAVENOUS | Status: AC
  Administered 2023-12-15: 2 g via INTRAVENOUS
  Filled 2023-12-15: qty 50

## 2023-12-15 MED ORDER — SODIUM CHLORIDE 0.9 % IV SOLN
500.0000 mg | Freq: Once | INTRAVENOUS | Status: AC
  Administered 2023-12-15: 500 mg via INTRAVENOUS
  Filled 2023-12-15: qty 5

## 2023-12-15 MED ORDER — METHYLPREDNISOLONE SODIUM SUCC 40 MG IJ SOLR: 40.0000 mg | Freq: Two times a day (BID) | INTRAMUSCULAR | Status: DC

## 2023-12-15 NOTE — Assessment & Plan Note (Signed)
-   The patient will be admitted to the progressive unit bed. - The patient was initially on BiPAP that was later tapered off. - O2 protocol be followed. - Management otherwise as below.

## 2023-12-15 NOTE — H&P (Signed)
[place]   PATIENT NAME: John Schwartz    MR#:  [id number]  DATE OF BIRTH:  11/08/1996  DATE OF ADMISSION:  12/15/2023  PRIMARY CARE PHYSICIAN: Pcp, No   Patient is coming from: Home  REQUESTING/REFERRING PHYSICIAN: Chiquita Loth, MD0  CHIEF COMPLAINT:   Chief Complaint  Patient presents with   Shortness of Breath    HISTORY OF PRESENT ILLNESS:  Braycen EMMIT ORILEY is a 28 y.o. African-American male with medical history significant for asthma, who presented to the emergency room with acute onset of acute asthma attack with dry cough, wheezing and dyspnea since yesterday.  The patient used 2 DuoNebs at home with partial relief of his symptoms.  EMS was called and staff gave him 125 mg of IV Solu-Medrol.  He arrived to the ED on CPAP due to respiratory distress.  He had a history of previous intubation and mechanical ventilation for asthma exacerbation in 2016 in addition to multiple hospitalization for asthma exacerbation.  He admitted to chest tightness with his dyspnea.  He denied any nausea or vomiting or diaphoresis or radiation of his chest tightness.  No fever or chills.  No dysuria, oliguria or hematuria or flank pain. . ED Course: When he came to the ER heart rate was 108 with otherwise normal vital signs.  Labs revealed hypokalemia of 3.4 with a calcium of 8.7, AST of 46 and otherwise unremarkable CMP.  Lactic acid was 2.4 and procalcitonin less than 0.1.  High sensitive troponin was 3 and later 4.  CBC was within normal.  Respiratory panel came back negative.  Blood cultures were drawn.  EKG as reviewed by me :  EKG showed sinus tachycardia with rate 115 with right axis deviation. Imaging: Portable chest x-ray showed no acute cardiopulmonary disease.  The patient was given nebulized albuterol, 2 g of IV magnesium sulfate and 1 L bolus of IV normal saline.  He will be admitted to a progressive unit bed for further evaluation and management. PAST MEDICAL HISTORY:   Past  Medical History:  Diagnosis Date   Asthma    Bronchospasm     PAST SURGICAL HISTORY:   Past Surgical History:  Procedure Laterality Date   NO PAST SURGERIES      SOCIAL HISTORY:   Social History   Tobacco Use   Smoking status: Never   Smokeless tobacco: Never  Substance Use Topics   Alcohol use: No    FAMILY HISTORY:   Family History  Problem Relation Age of Onset   Arthritis Paternal Grandmother     DRUG ALLERGIES:  No Known Allergies  REVIEW OF SYSTEMS:   ROS As per history of present illness. All pertinent systems were reviewed above. Constitutional, HEENT, cardiovascular, respiratory, GI, GU, musculoskeletal, neuro, psychiatric, endocrine, integumentary and hematologic systems were reviewed and are otherwise negative/unremarkable except for positive findings mentioned above in the HPI.   MEDICATIONS AT HOME:   Prior to Admission medications   Medication Sig Start Date End Date Taking? Authorizing Provider  acetaminophen (TYLENOL) 325 MG tablet Take 2 tablets (650 mg total) by mouth every 6 (six) hours as needed for mild pain (or Fever >/= 101). 05/27/19   Altamese Dilling, MD  albuterol (VENTOLIN HFA) 108 (90 Base) MCG/ACT inhaler Inhale 2 puffs into the lungs every 4 (four) hours as needed for wheezing or shortness of breath. 11/23/23   Arnetha Courser, MD  fluticasone-salmeterol (ADVAIR HFA) 230-21 MCG/ACT inhaler Inhale 2 puffs into the lungs 2 (two)  times daily. 12/14/23   Raechel Chute, MD  ipratropium-albuterol (DUONEB) 0.5-2.5 (3) MG/3ML SOLN Take 3 mLs by nebulization every 6 (six) hours as needed (For shortness of breath and wheezing). Up to 4 times daily if needed for wheezing or shortness of breath. 11/23/23   Arnetha Courser, MD  montelukast (SINGULAIR) 10 MG tablet Take 1 tablet (10 mg total) by mouth daily. 12/14/23 12/13/24  Raechel Chute, MD  predniSONE (DELTASONE) 20 MG tablet Take 20 mg by mouth daily. 11/29/23   [provider]   Spacer/Aero-Holding Chambers (AEROCHAMBER MV) inhaler Use as instructed 12/14/23   Raechel Chute, MD      VITAL SIGNS:  Blood pressure 135/84, pulse (!) 114, temperature 97.7 F (36.5 C), temperature source Axillary, resp. rate 19, weight 92.1 kg, SpO2 99%.  PHYSICAL EXAMINATION:  Physical Exam  GENERAL: Acutely ill 28 y.o.-year-old patient lying in the bed with my respiratory distress with conversational dyspnea, initially on BiPAP that was later on tapered off.  EYES: Pupils equal, round, reactive to light and accommodation. No scleral icterus. Extraocular muscles intact.  HEENT: Head atraumatic, normocephalic. Oropharynx and nasopharynx clear.  NECK:  Supple, no jugular venous distention. No thyroid enlargement, no tenderness.  LUNGS: Diffuse expiratory wheezes with tight expiratory airflow.  No use of accessory muscles of respiration.  CARDIOVASCULAR: Regular rate and rhythm, S1, S2 normal. No murmurs, rubs, or gallops.  ABDOMEN: Soft, nondistended, nontender. Bowel sounds present. No organomegaly or mass.  EXTREMITIES: No pedal edema, cyanosis, or clubbing.  NEUROLOGIC: Cranial nerves II through XII are intact. Muscle strength 5/5 in all extremities. Sensation intact. Gait not checked.  PSYCHIATRIC: The patient is alert and oriented x 3.  Normal affect and good eye contact. SKIN: No obvious rash, lesion, or ulcer.   LABORATORY PANEL:   CBC Recent Labs  Lab 12/15/23 0410  WBC 7.8  HGB 13.2  HCT 39.6  PLT 257   ------------------------------------------------------------------------------------------------------------------  Chemistries  Recent Labs  Lab 12/15/23 0202 12/15/23 0404  NA 140 140  K 3.4* 3.6  CL 107 103  CO2 22 22  GLUCOSE 134* 144*  BUN 11 10  CREATININE 1.04 1.13  CALCIUM 8.7* 8.3*  AST 24  --   ALT 46*  --   ALKPHOS 33*  --   BILITOT 0.6  --     ------------------------------------------------------------------------------------------------------------------  Cardiac Enzymes No results for input(s): "TROPONINI" in the last 168 hours. ------------------------------------------------------------------------------------------------------------------  RADIOLOGY:  DG Chest Port 1 View Result Date: 12/15/2023 CLINICAL DATA:  Shortness of breath EXAM: PORTABLE CHEST 1 VIEW COMPARISON:  Chest x-ray 11/22/2023 FINDINGS: The heart size and mediastinal contours are within normal limits. Both lungs are clear. The visualized skeletal structures are unremarkable. IMPRESSION: No active disease. Electronically Signed   By: Darliss Cheney M.D.   On: 12/15/2023 02:24      IMPRESSION AND PLAN:  Assessment and Plan: * Acute respiratory failure with hypoxia (HCC) - The patient will be admitted to the progressive unit bed. - The patient was initially on BiPAP that was later tapered off. - O2 protocol be followed. - Management otherwise as below.  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 - We will continue bronchodilator therapy with scheduled and as needed DuoNebs. - We will resume Singulair. - We will monitor O2. - We will hold off long-acting beta agonist. - Will resume anticholinergic bronchodilator therapy.     DVT prophylaxis: Lovenox. Advanced Care Planning:  Code Status: full  code. Family Communication:  The plan of care was discussed in details with the patient (and family). I answered all questions. The patient agreed to proceed with the above mentioned plan. Further management will depend upon hospital course. Disposition Plan: Back to previous home environment Consults called: none. All the records are reviewed and case discussed with ED provider.  Status is: Inpatient   At the time of the admission, it appears that the appropriate admission status for  this patient is inpatient.  This is judged to be reasonable and necessary in order to provide the required intensity of service to ensure the patient's safety given the presenting symptoms, physical exam findings and initial radiographic and laboratory data in the context of comorbid conditions.  The patient requires inpatient status due to high intensity of service, high risk of further deterioration and high frequency of surveillance required.  I certify that at the time of admission, it is my clinical judgment that the patient will require inpatient hospital care extending more than 2 midnights.                            Dispo: The patient is from: Home              Anticipated d/c is to: Home              Patient currently is not medically stable to d/c.              Difficult to place patient: No  Authorized and performed by: Valente David, MD Total critical care time:   40     minutes. Due to a high probability of clinically significant, life-threatening deterioration, the patient required my highest level of preparedness to intervene emergently and I personally spent this critical care time directly and personally managing the patient.  This critical care time included obtaining a history, examining the patient, pulse oximetry, ordering and review of studies, arranging urgent treatment with development of management plan, evaluation of patient's response to treatment, frequent reassessment, and discussions with other providers. This critical care time was performed to assess and manage the high probability of imminent, life-threatening deterioration that could result in multiorgan failure.  It was exclusive of separately billable procedures and treating other patients and teaching time.   Hannah Beat M.D on 12/15/2023 at 7:07 AM  [facility]   From 7 PM-7 AM, contact night-coverage [email or web address]  CC: Primary care physician; Pcp, No

## 2023-12-15 NOTE — Sepsis Progress Note (Signed)
Elink monitoring for the code sepsis protocol.

## 2023-12-15 NOTE — ED Notes (Signed)
La 2.4 Dr. Dolores Frame aware

## 2023-12-15 NOTE — Progress Notes (Signed)
CODE SEPSIS - PHARMACY COMMUNICATION  **Broad Spectrum Antibiotics should be administered within 1 hour of Sepsis diagnosis**  Time Code Sepsis Called/Page Received:  1/29 @ 0450  Antibiotics Ordered: Ceftriaxone, Azithromycin   Time of 1st antibiotic administration: Ceftriaxone 2 gm IV x 1 on 1/29 @ 0459   Additional action taken by pharmacy:   If necessary, Name of Provider/Nurse Contacted:     Moussa Wiegand D ,PharmD Clinical Pharmacist  12/15/2023  5:45 AM

## 2023-12-15 NOTE — ED Triage Notes (Signed)
Pt to ED via EMS with reports of shortness of breath, EMS reports 4 duo nebs prior to their arrival was given solumedrol 125 mg in route and placed on CPAP. 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

## 2023-12-15 NOTE — ED Notes (Signed)
La 2.0 Dr. Dolores Frame aware

## 2023-12-15 NOTE — Progress Notes (Signed)
Entered room to talk with patient regarding pending labs and discharge plan.  Room noted to be empty with no sign of patient.  Attempted to call patient; no answer and HIPAA compliant voice mail left for patient.  Patient believed to have multiple IVs in place upon leaving facility.  [place] [employer] contacted to conduct welfare check.

## 2023-12-15 NOTE — Progress Notes (Signed)
Received. Sent to media tab. Pharmacy team has started BIV in new phone encounter  Chesley Mires, PharmD, MPH, BCPS, CPP Clinical Pharmacist (Rheumatology and Pulmonology)

## 2023-12-15 NOTE — Telephone Encounter (Signed)
Received New start paperwork for DUPIXENT. Will update as we work through the benefits process.  Submitted a Prior Authorization request to [facility] for DUPIXENT via CoverMyMeds. Will update once we receive a response.  Key: John Schwartz

## 2023-12-15 NOTE — Assessment & Plan Note (Addendum)
-   We will continue steroid therapy with IV Solu-Medrol. - We will continue bronchodilator therapy with scheduled and as needed DuoNebs. - We will resume Singulair. - We will monitor O2. - We will hold off long-acting beta agonist. - Will resume anticholinergic bronchodilator therapy.

## 2023-12-15 NOTE — Discharge Summary (Signed)
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  Hospital Course:  John Schwartz is a 28 y.o. African-American male with medical history significant for asthma, who presented to the emergency room with acute onset of acute asthma attack with dry cough, wheezing and dyspnea since yesterday.  The patient used 2 DuoNebs at home with partial relief of his symptoms.  EMS was called and staff gave him 125 mg of IV Solu-Medrol.  He arrived to the ED on CPAP due to respiratory distress.  He had a history of previous intubation and mechanical ventilation for asthma exacerbation in 2016 in addition to multiple hospitalization for asthma exacerbation.  He admitted to chest tightness with his dyspnea.  He denied any nausea or vomiting or diaphoresis or radiation of his chest tightness.  No fever or chills.  No dysuria, oliguria or hematuria or flank pain. . ED Course: When he came to the ER heart rate was 108 with otherwise normal vital signs.  Labs revealed hypokalemia of 3.4 with a calcium of 8.7, AST of 46 and otherwise unremarkable CMP.  Lactic acid was 2.4 and procalcitonin less than 0.1.  High sensitive troponin was 3 and later 4.  CBC was within normal.  Respiratory panel came back negative.  Blood cultures were  drawn.  EKG as reviewed by me :  EKG showed sinus tachycardia with rate 115 with right axis deviation. Imaging: Portable chest x-ray showed no acute cardiopulmonary disease.   The patient was given nebulized albuterol, 2 g of IV magnesium sulfate and 1 L bolus of IV normal saline.  He will be admitted to a progressive unit bed for further evaluation and management.  12/15/23 patient was admitted due to respiratory failure.  Patient was feeling improvement, wheezing resolved, off BiPAP.  Patient was almost back to normal.  Lactic acid 3.7 elevated, order placed to repeat lactic acid before discharge but patient could not stay longer and he left AMA without signing papers.   During hospital stay patient was managed as below.  Assessment and Plan: * Acute respiratory failure with hypoxia (HCC) - The patient will be admitted to the progressive unit bed. - The patient was initially on BiPAP that was later tapered off. - O2 protocol be followed. - Management otherwise as below.   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 - We will continue bronchodilator therapy with scheduled and as needed DuoNebs. - We will resume Singulair. - We will monitor O2. - We will hold off long-acting beta agonist. - Will resume anticholinergic bronchodilator therapy.   Body mass index is 29.13 kg/m.  Nutrition Interventions:  Consultants: None Procedures: None  Discharge Exam: General: Appear in no distress, no Rash; Oral Mucosa Clear, moist. Cardiovascular: S1 and S2 Present, no Murmur,  Respiratory: normal respiratory effort, Bilateral Air entry present and no Crackles, \minimal wheezes Abdomen: Bowel Sound present, Soft and no tenderness, no hernia Extremities: no Pedal edema, no calf tenderness Neurology: alert and oriented to time, place, and person affect appropriate.  Filed Weights   12/15/23 0436  Weight: 92.1 kg   Vitals:    12/15/23 0830 12/15/23 1000  BP: (!) 133/109   Pulse: (!) 121 (!) 113  Resp: 18 20  Temp:    SpO2: 98% 96%    DISCHARGE MEDICATION: Allergies as of 12/15/2023   No Known Allergies      No Known Allergies   The results of significant diagnostics from this hospitalization (including imaging, microbiology, ancillary and laboratory) are listed below for reference.    Significant Diagnostic Studies: DG Chest Port 1 View Result Date: 12/15/2023 CLINICAL DATA:  Shortness of breath EXAM: PORTABLE CHEST 1 VIEW COMPARISON:  Chest x-ray 11/22/2023 FINDINGS: The heart size and mediastinal contours are within normal limits. Both lungs are clear. The visualized skeletal structures are unremarkable. IMPRESSION: No active disease. Electronically Signed   By: Darliss Cheney M.D.   On: 12/15/2023 02:24   DG Chest 2 View Result Date: 11/22/2023 CLINICAL DATA:  Shortness of breath, asthma EXAM: CHEST - 2 VIEW COMPARISON:  10/23/2023 FINDINGS: The heart size and mediastinal contours are within normal limits. Both lungs are clear. The visualized skeletal structures are unremarkable. IMPRESSION: No active cardiopulmonary disease. Electronically Signed   By: Judie Petit.  Shick M.D.   On: 11/22/2023 16:42    Microbiology: Recent Results (from the past 240 hours)  Culture, blood (routine x 2)     Status: None (Preliminary result)   Collection Time: 12/15/23  2:02 AM   Specimen: BLOOD  Result Value Ref Range Status   Specimen Description BLOOD RIGHT ANTECUBITAL  Final   Special Requests   Final    BOTTLES DRAWN AEROBIC AND ANAEROBIC Blood Culture results may not be optimal due to an excessive volume of blood received in culture bottles   Culture   Final    NO GROWTH < 12 HOURS Performed at [facility], [address] [place], [place] [address]    Report Status PENDING  Incomplete  Culture, blood (routine x 2)     Status: None (Preliminary result)   Collection Time: 12/15/23  2:02 AM   Specimen:  BLOOD  Result Value Ref Range Status   Specimen Description BLOOD LEFT ANTECUBITAL  Final   Special Requests   Final    BOTTLES DRAWN AEROBIC AND ANAEROBIC Blood Culture results may not be optimal due to an excessive volume of blood received in culture bottles   Culture   Final    NO GROWTH < 12 HOURS Performed at [facility], [address] [place], [place] [address]    Report Status PENDING  Incomplete  Resp panel by RT-PCR (RSV, Flu A&B, Covid) Anterior Nasal Swab     Status: None   Collection Time: 12/15/23  2:02 AM   Specimen: Anterior Nasal Swab  Result Value Ref Range Status   SARS Coronavirus 2 by RT PCR NEGATIVE NEGATIVE Final    Comment: (NOTE) SARS-CoV-2 target nucleic acids are NOT DETECTED.  The SARS-CoV-2 RNA is generally detectable in upper respiratory specimens during the acute phase of infection. The lowest concentration of SARS-CoV-2 viral copies this assay can detect is 138 copies/mL. A negative result does not preclude SARS-Cov-2 infection and should not be used as the sole basis for treatment  or other patient management decisions. A negative result may occur with  improper specimen collection/handling, submission of specimen other than nasopharyngeal swab, presence of viral mutation(s) within the areas targeted by this assay, and inadequate number of viral copies(<138 copies/mL). A negative result must be combined with clinical observations, patient history, and epidemiological information. The expected result is Negative.  Fact Sheet for Patients:  BloggerCourse.com  Fact Sheet for Healthcare Providers:  SeriousBroker.it  This test is no t yet approved or cleared by the [place] FDA and  has been authorized for detection and/or diagnosis of SARS-CoV-2 by FDA under an Emergency Use Authorization (EUA). This EUA will remain  in effect (meaning this test can be used) for the duration of  the COVID-19 declaration under Section 564(b)(1) of the Act, 21 U.S.C.section 360bbb-3(b)(1), unless the authorization is terminated  or revoked sooner.       Influenza A by PCR NEGATIVE NEGATIVE Final   Influenza B by PCR NEGATIVE NEGATIVE Final    Comment: (NOTE) The Xpert Xpress SARS-CoV-2/FLU/RSV plus assay is intended as an aid in the diagnosis of influenza from Nasopharyngeal swab specimens and should not be used as a sole basis for treatment. Nasal washings and aspirates are unacceptable for Xpert Xpress SARS-CoV-2/FLU/RSV testing.  Fact Sheet for Patients: BloggerCourse.com  Fact Sheet for Healthcare Providers: SeriousBroker.it  This test is not yet approved or cleared by the [place] FDA and has been authorized for detection and/or diagnosis of SARS-CoV-2 by FDA under an Emergency Use Authorization (EUA). This EUA will remain in effect (meaning this test can be used) for the duration of the COVID-19 declaration under Section 564(b)(1) of the Act, 21 U.S.C. section 360bbb-3(b)(1), unless the authorization is terminated or revoked.     Resp Syncytial Virus by PCR NEGATIVE NEGATIVE Final    Comment: (NOTE) Fact Sheet for Patients: BloggerCourse.com  Fact Sheet for Healthcare Providers: SeriousBroker.it  This test is not yet approved or cleared by the [place] FDA and has been authorized for detection and/or diagnosis of SARS-CoV-2 by FDA under an Emergency Use Authorization (EUA). This EUA will remain in effect (meaning this test can be used) for the duration of the COVID-19 declaration under Section 564(b)(1) of the Act, 21 U.S.C. section 360bbb-3(b)(1), unless the authorization is terminated or revoked.  Performed at [facility], [address] [place], [place] [address]      Labs: CBC: Recent Labs  Lab 12/15/23 0202 12/15/23 0410   WBC 8.7 7.8  NEUTROABS 6.0  --   HGB 13.9 13.2  HCT 40.6 39.6  MCV 85.1 86.1  PLT 255 257   Basic Metabolic Panel: Recent Labs  Lab 12/15/23 0202 12/15/23 0404  NA 140 140  K 3.4* 3.6  CL 107 103  CO2 22 22  GLUCOSE 134* 144*  BUN 11 10  CREATININE 1.04 1.13  CALCIUM 8.7* 8.3*  MG  --  3.7*  PHOS  --  2.6   Liver Function Tests: Recent Labs  Lab 12/15/23 0202  AST 24  ALT 46*  ALKPHOS 33*  BILITOT 0.6  PROT 6.6  ALBUMIN 3.9   No results for input(s): "LIPASE", "AMYLASE" in the last 168 hours. No results for input(s): "AMMONIA" in the last 168 hours. Cardiac Enzymes: No results for input(s): "CKTOTAL", "CKMB", "CKMBINDEX", "TROPONINI" in the last 168 hours. BNP (last 3 results) No results for input(s): "BNP" in the last 8760 hours. CBG: No results for input(s): "GLUCAP" in the last 168 hours.  Time  spent: 35 minutes  Signed:  Gillis Santa  [facility] 12/15/2023 1:16 PM

## 2023-12-15 NOTE — Telephone Encounter (Signed)
Received notification from [facility] regarding a prior authorization for DUPIXENT. Authorization has been APPROVED from 12/15/2023 to 12/17/2023. Approval letter sent to scan center. (Per test claim, the maintenance dose is approved until 06/12/24)  Per test claim, copay for 28 days supply is $4.00 (8mL is approved per letter, however an override code needs to be placed and is supposedly only approved for the next 2 days)  Patient can fill through [facility]: [phone]   Authorization # [id number]

## 2023-12-15 NOTE — ED Provider Notes (Addendum)
[facility] Provider Note    Event Date/Time   First MD Initiated Contact with Patient 12/15/23 0148     (approximate)   History   Respiratory distress   HPI  John Schwartz is a 28 y.o. male brought to the ED via EMS from home with a chief complaint of respiratory distress.  Patient with a history of asthma well-known to this examiner who reports cough and shortness of breath prior to arrival.  Performed 2 DuoNeb's at home with partial relief of symptoms.  EMS gave 125 mg IV Solu-Medrol.  Patient arrives to the ED on CPAP.  History of intubation.  History of multiple hospitalizations for asthma.  Endorses chest tightness.  Denies fever/chills, abdominal pain, nausea, vomiting or dizziness.     Past Medical History   Past Medical History:  Diagnosis Date   Asthma    Bronchospasm      Active Problem List   Patient Active Problem List   Diagnosis Date Noted   Acute respiratory failure with hypoxia (HCC) 12/15/2023   Asthma 09/16/2023   Asthma with severe exacerbation 09/11/2023   Contusion of left elbow 12/16/2021   Acute severe exacerbation of asthma 05/14/2020   Asthma with status asthmaticus 06/04/2016   Acute hypokalemia 06/04/2016   Noncompliance with medication treatment due to underuse of medication 06/04/2016   Acute renal insufficiency 06/04/2016   Acute asthma exacerbation 06/04/2016   Dyspnea 03/16/2016   Acute exacerbation of severe persistent extrinsic asthma 03/16/2016   Severe persistent asthma 03/12/2014   Status asthmaticus 02/11/2014     Past Surgical History   Past Surgical History:  Procedure Laterality Date   NO PAST SURGERIES       Home Medications   Prior to Admission medications   Medication Sig Start Date End Date Taking? Authorizing Provider  acetaminophen (TYLENOL) 325 MG tablet Take 2 tablets (650 mg total) by mouth every 6 (six) hours as needed for mild pain (or Fever >/= 101). 05/27/19   Altamese Dilling, MD  albuterol (VENTOLIN HFA) 108 (90 Base) MCG/ACT inhaler Inhale 2 puffs into the lungs every 4 (four) hours as needed for wheezing or shortness of breath. 11/23/23   Arnetha Courser, MD  fluticasone-salmeterol (ADVAIR HFA) 230-21 MCG/ACT inhaler Inhale 2 puffs into the lungs 2 (two) times daily. 12/14/23   Raechel Chute, MD  ipratropium-albuterol (DUONEB) 0.5-2.5 (3) MG/3ML SOLN Take 3 mLs by nebulization every 6 (six) hours as needed (For shortness of breath and wheezing). Up to 4 times daily if needed for wheezing or shortness of breath. 11/23/23   Arnetha Courser, MD  montelukast (SINGULAIR) 10 MG tablet Take 1 tablet (10 mg total) by mouth daily. 12/14/23 12/13/24  Raechel Chute, MD  predniSONE (DELTASONE) 20 MG tablet Take 20 mg by mouth daily. 11/29/23   [provider]  Spacer/Aero-Holding Chambers (AEROCHAMBER MV) inhaler Use as instructed 12/14/23   Raechel Chute, MD     Allergies  Patient has no known allergies.   Family History   Family History  Problem Relation Age of Onset   Arthritis Paternal Grandmother      Physical Exam  Triage Vital Signs: ED Triage Vitals  Encounter Vitals Group     BP      Systolic BP Percentile      Diastolic BP Percentile      Pulse      Resp      Temp      Temp src  SpO2      Weight      Height      Head Circumference      Peak Flow      Pain Score      Pain Loc      Pain Education      Exclude from Growth Chart     Updated Vital Signs: BP (!) 150/102   Pulse (!) 101   Temp 97.7 F (36.5 C) (Axillary)   Resp 18   Wt 92.1 kg   SpO2 100%   BMI 29.13 kg/m    General: Awake, moderate distress.  CV:  Tachycardic.  Good peripheral perfusion.  Resp:  Increased effort.  Retractions.  Diffuse wheezing. Abd:  Nontender.  No distention.  Other:  Bilateral calves are supple without tenderness.   ED Results / Procedures / Treatments  Labs (all labs ordered are listed, but only abnormal results are  displayed) Labs Reviewed  LACTIC ACID, PLASMA - Abnormal; Notable for the following components:      Result Value   Lactic Acid, Venous 2.4 (*)    All other components within normal limits  LACTIC ACID, PLASMA - Abnormal; Notable for the following components:   Lactic Acid, Venous 2.0 (*)    All other components within normal limits  CBC WITH DIFFERENTIAL/PLATELET - Abnormal; Notable for the following components:   Eosinophils Absolute 0.6 (*)    All other components within normal limits  COMPREHENSIVE METABOLIC PANEL - Abnormal; Notable for the following components:   Potassium 3.4 (*)    Glucose, Bld 134 (*)    Calcium 8.7 (*)    ALT 46 (*)    Alkaline Phosphatase 33 (*)    All other components within normal limits  BASIC METABOLIC PANEL - Abnormal; Notable for the following components:   Glucose, Bld 144 (*)    Calcium 8.3 (*)    All other components within normal limits  RESP PANEL BY RT-PCR (RSV, FLU A&B, COVID)  [id number]  CULTURE, BLOOD (ROUTINE X 2)  CULTURE, BLOOD (ROUTINE X 2)  PROCALCITONIN  CBC  LACTIC ACID, PLASMA  TROPONIN I (HIGH SENSITIVITY)  TROPONIN I (HIGH SENSITIVITY)     EKG  ED ECG REPORT I, Ladeidra Borys J, the attending physician, personally viewed and interpreted this ECG.   Date: 12/15/2023  EKG Time: 0203  Rate: 115  Rhythm: sinus tachycardia  Axis: RAD  Intervals:none  ST&T Change: Nonspecific    RADIOLOGY I have independently visualized and interpreted patient's imaging study as well as noted the radiology interpretation:  Chest x-ray: No acute cardiopulmonary process  Official radiology report(s): DG Chest Port 1 View Result Date: 12/15/2023 CLINICAL DATA:  Shortness of breath EXAM: PORTABLE CHEST 1 VIEW COMPARISON:  Chest x-ray 11/22/2023 FINDINGS: The heart size and mediastinal contours are within normal limits. Both lungs are clear. The visualized skeletal structures are unremarkable. IMPRESSION: No active disease. Electronically  Signed   By: Darliss Cheney M.D.   On: 12/15/2023 02:24     PROCEDURES:  Critical Care performed: Yes, see critical care procedure note(s)  CRITICAL CARE Performed by: Irean Hong   Total critical care time: 45 minutes  Critical care time was exclusive of separately billable procedures and treating other patients.  Critical care was necessary to treat or prevent imminent or life-threatening deterioration.  Critical care was time spent personally by me on the following activities: development of treatment plan with patient and/or surrogate as well as nursing, discussions with consultants, evaluation of  patient's response to treatment, examination of patient, obtaining history from patient or surrogate, ordering and performing treatments and interventions, ordering and review of laboratory studies, ordering and review of radiographic studies, pulse oximetry and re-evaluation of patient's condition.   Marland Kitchen1-3 Lead EKG Interpretation  Performed by: Irean Hong, MD Authorized by: Irean Hong, MD     Interpretation: abnormal     ECG rate:  115   ECG rate assessment: tachycardic     Rhythm: sinus tachycardia     Ectopy: none     Conduction: normal   Comments:     Patient placed on cardiac monitor to evaluate for arrhythmias    MEDICATIONS ORDERED IN ED: Medications  montelukast (SINGULAIR) tablet 10 mg (has no administration in time range)  ipratropium-albuterol (DUONEB) 0.5-2.5 (3) MG/3ML nebulizer solution 3 mL (has no administration in time range)  methylPREDNISolone sodium succinate (SOLU-MEDROL) 40 mg/mL injection 40 mg (has no administration in time range)  enoxaparin (LOVENOX) injection 40 mg (has no administration in time range)  acetaminophen (TYLENOL) tablet 650 mg (has no administration in time range)    Or  acetaminophen (TYLENOL) suppository 650 mg (has no administration in time range)  traZODone (DESYREL) tablet 25 mg (has no administration in time range)  magnesium  hydroxide (MILK OF MAGNESIA) suspension 30 mL (has no administration in time range)  ondansetron (ZOFRAN) tablet 4 mg (has no administration in time range)    Or  ondansetron (ZOFRAN) injection 4 mg (has no administration in time range)  guaiFENesin (MUCINEX) 12 hr tablet 600 mg (has no administration in time range)  chlorpheniramine-HYDROcodone (TUSSIONEX) 10-8 MG/5ML suspension 5 mL (has no administration in time range)  azithromycin (ZITHROMAX) 500 mg in sodium chloride 0.9 % 250 mL IVPB (500 mg Intravenous New Bag/Given 12/15/23 0539)  magnesium sulfate IVPB 2 g 50 mL (0 g Intravenous Stopped 12/15/23 0313)  albuterol (PROVENTIL) (2.5 MG/3ML) 0.083% nebulizer solution 10 mg (10 mg Nebulization Given 12/15/23 0258)  sodium chloride 0.9 % bolus 1,000 mL (0 mLs Intravenous Stopped 12/15/23 0450)  sodium chloride 0.9 % bolus 1,000 mL (1,000 mLs Intravenous New Bag/Given 12/15/23 0456)    And  sodium chloride 0.9 % bolus 1,000 mL (1,000 mLs Intravenous New Bag/Given 12/15/23 0501)  cefTRIAXone (ROCEPHIN) 2 g in sodium chloride 0.9 % 100 mL IVPB (0 g Intravenous Stopped 12/15/23 0535)     IMPRESSION / MDM / ASSESSMENT AND PLAN / ED COURSE  I reviewed the triage vital signs and the nursing notes.                             28 year old male presenting in respiratory distress. Differential includes, but is not limited to, viral syndrome, bronchitis including COPD exacerbation, pneumonia, reactive airway disease including asthma, CHF including exacerbation with or without pulmonary/interstitial edema, pneumothorax, ACS, thoracic trauma, and pulmonary embolism.  I personally reviewed patient's records and noted a pulmonology office visit from just yesterday for follow-up severe persistent asthma and shortness of breath; Advair and Singulair started at that visit..  Patient's presentation is most consistent with acute presentation with potential threat to life or bodily function.  The patient is on the  cardiac monitor to evaluate for evidence of arrhythmia and/or significant heart rate changes.  Will place patient on BiPAP here, continuous albuterol neb.  Add 2 g IV magnesium.  Obtain sepsis workup, chest x-ray.  Anticipate hospitalization.  Clinical Course as of 12/15/23 0614  Wed  Dec 15, 2023  0248 Work of breathing improved.  Patient sleeping in no acute distress.  Slight wheeze remains.  Chest x-ray negative for pneumonia.  Will consult hospital services for evaluation and admission. [JS]  0250 Lactic acid 2.0.  Chest x-ray negative.  Feel more likely from respiratory distress and not from pneumonia.  Will add procalcitonin.  Administer 1 L IV fluids and recheck. [JS]  0450 Lactic acid is trending.  Procalcitonin pending.  Out of an abundance of caution, will administer 30 cc/kilo IV normal saline and broad-spectrum IV antibiotics to cover possible community-acquired pneumonia. [JS]  [id number] Procalcitonin negative.  Will draw a third lactic acid. [JS]    Clinical Course User Index [JS] Irean Hong, MD     FINAL CLINICAL IMPRESSION(S) / ED DIAGNOSES   Final diagnoses:  Respiratory distress  Severe persistent asthma with exacerbation  Sepsis, due to unspecified organism, unspecified whether acute organ dysfunction present [facility])     Rx / DC Orders   ED Discharge Orders     None        Note:  This document was prepared using Dragon voice recognition software and may include unintentional dictation errors.   Irean Hong, MD 12/15/23 0451    Irean Hong, MD 12/15/23 [phone]

## 2023-12-17 LAB — ALLERGEN PANEL (27) + IGE
Alternaria Alternata IgE: <0.1 kU/L
Aspergillus Fumigatus IgE: <0.1 kU/L
Bahia Grass IgE: <0.1 kU/L
Bermuda Grass IgE: <0.1 kU/L
Cat Dander IgE: <0.1 kU/L
Cedar, Mountain IgE: <0.1 kU/L
Cladosporium Herbarum IgE: <0.1 kU/L
Cocklebur IgE: <0.1 kU/L
Cockroach, American IgE: <0.1 kU/L
Common Silver Birch IgE: <0.1 kU/L
D Farinae IgE: <0.1 kU/L
D Pteronyssinus IgE: <0.1 kU/L
Dog Dander IgE: <0.1 kU/L
Elm, American IgE: <0.1 kU/L
Hickory, White IgE: <0.1 kU/L
IgE (Immunoglobulin E), Serum: 75 [IU]/mL (ref 6–495)
Johnson Grass IgE: <0.1 kU/L
Kentucky Bluegrass IgE: <0.1 kU/L
Maple/Box Elder IgE: <0.1 kU/L
Mucor Racemosus IgE: <0.1 kU/L
Oak, White IgE: <0.1 kU/L
Penicillium Chrysogen IgE: <0.1 kU/L
Pigweed, Rough IgE: <0.1 kU/L
Plantain, English IgE: <0.1 kU/L
Ragweed, Short IgE: <0.1 kU/L
Setomelanomma Rostrat: <0.1 kU/L
Timothy Grass IgE: <0.1 kU/L
White Mulberry IgE: <0.1 kU/L

## 2023-12-18 NOTE — Addendum Note (Signed)
Addended by: Raechel Chute on: 12/18/2023 10:18 AM   Modules accepted: Orders

## 2023-12-20 LAB — CULTURE, BLOOD (ROUTINE X 2)
Culture: NO GROWTH
Culture: NO GROWTH

## 2023-12-21 NOTE — Telephone Encounter (Signed)
Patient has been trying to call the office about his Rx for Dupixent

## 2023-12-23 ENCOUNTER — Encounter: Payer: Self-pay | Admitting: Pharmacist

## 2023-12-23 NOTE — Telephone Encounter (Signed)
 Patient scheduled for Dupixent  new start on  12/27/2023

## 2023-12-27 ENCOUNTER — Emergency Department: Payer: Medicaid Other

## 2023-12-27 ENCOUNTER — Encounter: Payer: Self-pay | Admitting: Internal Medicine

## 2023-12-27 ENCOUNTER — Inpatient Hospital Stay
Admission: EM | Admit: 2023-12-27 | Discharge: 2023-12-28 | DRG: 189 | Disposition: A | Discharge status: Home / Self Care | Payer: Medicaid Other | Attending: Internal Medicine | Admitting: Internal Medicine

## 2023-12-27 ENCOUNTER — Other Ambulatory Visit: Payer: Self-pay

## 2023-12-27 ENCOUNTER — Other Ambulatory Visit: Payer: Medicaid Other | Admitting: Pharmacist

## 2023-12-27 DIAGNOSIS — Z7951 Long term (current) use of inhaled steroids: Secondary | ICD-10-CM | POA: Diagnosis not present

## 2023-12-27 DIAGNOSIS — Z7952 Long term (current) use of systemic steroids: Secondary | ICD-10-CM | POA: Diagnosis not present

## 2023-12-27 DIAGNOSIS — Z1152 Encounter for screening for COVID-19: Secondary | ICD-10-CM | POA: Diagnosis not present

## 2023-12-27 DIAGNOSIS — J9601 Acute respiratory failure with hypoxia: Secondary | ICD-10-CM | POA: Diagnosis present

## 2023-12-27 DIAGNOSIS — J45901 Unspecified asthma with (acute) exacerbation: Secondary | ICD-10-CM | POA: Diagnosis present

## 2023-12-27 DIAGNOSIS — R0603 Acute respiratory distress: Secondary | ICD-10-CM

## 2023-12-27 DIAGNOSIS — R000 Tachycardia, unspecified: Secondary | ICD-10-CM | POA: Diagnosis present

## 2023-12-27 DIAGNOSIS — J4551 Severe persistent asthma with (acute) exacerbation: Secondary | ICD-10-CM | POA: Diagnosis present

## 2023-12-27 DIAGNOSIS — Z79899 Other long term (current) drug therapy: Secondary | ICD-10-CM

## 2023-12-27 DIAGNOSIS — E669 Obesity, unspecified: Secondary | ICD-10-CM | POA: Diagnosis not present

## 2023-12-27 LAB — CBC WITH DIFFERENTIAL/PLATELET
Abs Immature Granulocytes: 0.07 10*3/uL (ref 0.00–0.07)
Basophils Absolute: 0 10*3/uL (ref 0.0–0.1)
Basophils Relative: 0 %
Eosinophils Absolute: 1.1 10*3/uL — ABNORMAL HIGH (ref 0.0–0.5)
Eosinophils Relative: 12 %
HCT: 41.9 % (ref 39.0–52.0)
Hemoglobin: 13.9 g/dL (ref 13.0–17.0)
Immature Granulocytes: 1 %
Lymphocytes Relative: 21 %
Lymphs Abs: 2.1 10*3/uL (ref 0.7–4.0)
MCH: 28.8 pg (ref 26.0–34.0)
MCHC: 33.2 g/dL (ref 30.0–36.0)
MCV: 86.7 fL (ref 80.0–100.0)
Monocytes Absolute: 0.7 10*3/uL (ref 0.1–1.0)
Monocytes Relative: 7 %
Neutro Abs: 5.9 10*3/uL (ref 1.7–7.7)
Neutrophils Relative %: 59 %
Platelets: 271 10*3/uL (ref 150–400)
RBC: 4.83 MIL/uL (ref 4.22–5.81)
RDW: 12.4 % (ref 11.5–15.5)
WBC: 9.9 10*3/uL (ref 4.0–10.5)
nRBC: 0 % (ref 0.0–0.2)

## 2023-12-27 LAB — BASIC METABOLIC PANEL
Anion gap: 11 (ref 5–15)
Anion gap: 10 (ref 5–15)
BUN: 10 mg/dL (ref 6–20)
BUN: 11 mg/dL (ref 6–20)
CO2: 23 mmol/L (ref 22–32)
CO2: 22 mmol/L (ref 22–32)
Calcium: 8.6 mg/dL — ABNORMAL LOW (ref 8.9–10.3)
Calcium: 8.4 mg/dL — ABNORMAL LOW (ref 8.9–10.3)
Chloride: 103 mmol/L (ref 98–111)
Chloride: 105 mmol/L (ref 98–111)
Creatinine, Ser: 1.2 mg/dL (ref 0.61–1.24)
Creatinine, Ser: 1.33 mg/dL — ABNORMAL HIGH (ref 0.61–1.24)
GFR, Estimated: >60 mL/min (ref >60)
GFR, Estimated: >60 mL/min (ref >60)
Glucose, Bld: 163 mg/dL — ABNORMAL HIGH (ref 70–99)
Glucose, Bld: 152 mg/dL — ABNORMAL HIGH (ref 70–99)
Potassium: 3.5 mmol/L (ref 3.5–5.1)
Potassium: 4.5 mmol/L (ref 3.5–5.1)
Sodium: 137 mmol/L (ref 135–145)
Sodium: 137 mmol/L (ref 135–145)

## 2023-12-27 LAB — CBC
HCT: 41.3 % (ref 39.0–52.0)
Hemoglobin: 13.6 g/dL (ref 13.0–17.0)
MCH: 28.5 pg (ref 26.0–34.0)
MCHC: 32.9 g/dL (ref 30.0–36.0)
MCV: 86.6 fL (ref 80.0–100.0)
Platelets: 268 10*3/uL (ref 150–400)
RBC: 4.77 MIL/uL (ref 4.22–5.81)
RDW: 12.2 % (ref 11.5–15.5)
WBC: 11.9 10*3/uL — ABNORMAL HIGH (ref 4.0–10.5)
nRBC: 0 % (ref 0.0–0.2)

## 2023-12-27 LAB — RESP PANEL BY RT-PCR (RSV, FLU A&B, COVID)  RVPGX2
Influenza A by PCR: NEGATIVE
Influenza B by PCR: NEGATIVE
Resp Syncytial Virus by PCR: NEGATIVE
SARS Coronavirus 2 by RT PCR: NEGATIVE

## 2023-12-27 LAB — MAGNESIUM: Magnesium: 3 mg/dL — ABNORMAL HIGH (ref 1.7–2.4)

## 2023-12-27 LAB — TROPONIN I (HIGH SENSITIVITY): Troponin I (High Sensitivity): 5 ng/L (ref <18)

## 2023-12-27 MED ORDER — SODIUM CHLORIDE 0.9 % IV SOLN: INTRAVENOUS | Status: AC

## 2023-12-27 MED ORDER — MONTELUKAST SODIUM 10 MG PO TABS
10.0000 mg | ORAL_TABLET | Freq: Every day | ORAL | Status: DC
  Administered 2023-12-27 – 2023-12-28 (×2): 10 mg via ORAL
  Filled 2023-12-27 (×2): qty 1

## 2023-12-27 MED ORDER — PREDNISONE 20 MG PO TABS
40.0000 mg | ORAL_TABLET | Freq: Every day | ORAL | Status: DC
  Administered 2023-12-28: 40 mg via ORAL
  Filled 2023-12-27: qty 2

## 2023-12-27 MED ORDER — PREDNISONE 20 MG PO TABS: 40.0000 mg | ORAL_TABLET | Freq: Every day | ORAL | Status: DC

## 2023-12-27 MED ORDER — SODIUM CHLORIDE 0.9 % IV BOLUS
1000.0000 mL | Freq: Once | INTRAVENOUS | Status: AC
  Administered 2023-12-27: 1000 mL via INTRAVENOUS

## 2023-12-27 MED ORDER — ALBUTEROL SULFATE (2.5 MG/3ML) 0.083% IN NEBU
10.0000 mg | INHALATION_SOLUTION | Freq: Once | RESPIRATORY_TRACT | Status: AC
  Administered 2023-12-27: 10 mg via RESPIRATORY_TRACT
  Filled 2023-12-27: qty 12

## 2023-12-27 MED ORDER — ACETAMINOPHEN 650 MG RE SUPP: 650.0000 mg | Freq: Four times a day (QID) | RECTAL | Status: DC | PRN

## 2023-12-27 MED ORDER — METHYLPREDNISOLONE SODIUM SUCC 40 MG IJ SOLR
40.0000 mg | Freq: Two times a day (BID) | INTRAMUSCULAR | Status: AC
  Administered 2023-12-27 (×2): 40 mg via INTRAVENOUS
  Filled 2023-12-27 (×2): qty 1

## 2023-12-27 MED ORDER — ENOXAPARIN SODIUM 40 MG/0.4ML IJ SOSY
40.0000 mg | PREFILLED_SYRINGE | INTRAMUSCULAR | Status: DC
  Filled 2023-12-27: qty 0.4

## 2023-12-27 MED ORDER — IPRATROPIUM-ALBUTEROL 0.5-2.5 (3) MG/3ML IN SOLN
3.0000 mL | RESPIRATORY_TRACT | Status: DC | PRN
  Administered 2023-12-28: 3 mL via RESPIRATORY_TRACT
  Filled 2023-12-27: qty 3

## 2023-12-27 MED ORDER — HYDROCOD POLI-CHLORPHE POLI ER 10-8 MG/5ML PO SUER: 5.0000 mL | Freq: Two times a day (BID) | ORAL | Status: DC | PRN

## 2023-12-27 MED ORDER — GUAIFENESIN ER 600 MG PO TB12
600.0000 mg | ORAL_TABLET | Freq: Two times a day (BID) | ORAL | Status: DC
  Administered 2023-12-27 – 2023-12-28 (×2): 600 mg via ORAL
  Filled 2023-12-27 (×3): qty 1

## 2023-12-27 MED ORDER — IPRATROPIUM-ALBUTEROL 0.5-2.5 (3) MG/3ML IN SOLN
3.0000 mL | Freq: Four times a day (QID) | RESPIRATORY_TRACT | Status: DC
  Administered 2023-12-27 – 2023-12-28 (×5): 3 mL via RESPIRATORY_TRACT
  Filled 2023-12-27 (×5): qty 3

## 2023-12-27 MED ORDER — TRAZODONE HCL 50 MG PO TABS: 25.0000 mg | ORAL_TABLET | Freq: Every evening | ORAL | Status: DC | PRN

## 2023-12-27 MED ORDER — SODIUM CHLORIDE 0.9 % IV SOLN
1.0000 g | Freq: Every day | INTRAVENOUS | Status: DC
  Administered 2023-12-27 – 2023-12-28 (×2): 1 g via INTRAVENOUS
  Filled 2023-12-27 (×2): qty 10

## 2023-12-27 MED ORDER — ACETAMINOPHEN 325 MG PO TABS: 650.0000 mg | ORAL_TABLET | Freq: Four times a day (QID) | ORAL | Status: DC | PRN

## 2023-12-27 MED ORDER — METHYLPREDNISOLONE SODIUM SUCC 40 MG IJ SOLR
40.0000 mg | Freq: Two times a day (BID) | INTRAMUSCULAR | Status: DC
  Filled 2023-12-27: qty 1

## 2023-12-27 NOTE — ED Notes (Signed)
 CCMD called and pt placed on tele.

## 2023-12-27 NOTE — ED Provider Notes (Signed)
 [facility] Provider Note    Event Date/Time   First MD Initiated Contact with Patient 12/27/23 [phone]     (approximate)   History   Shortness of Breath   HPI  Level V caveat: Limited by distress  John Schwartz is a 28 y.o. male brought to the ED via EMS from home with a chief complaint of respiratory distress.  Patient with a history of asthma requiring intubation, single lung who reports breathing difficulty x 2 hours.  By EMS arrival, patient had perform 6 DuoNebs at home.  EMS gave 125 mg IV Solu-Medrol  and 2 g IV magnesium .  Patient arrives to the ED on CPAP.     Past Medical History   Past Medical History:  Diagnosis Date   Asthma    Bronchospasm      Active Problem List   Patient Active Problem List   Diagnosis Date Noted   Severe persistent asthma with exacerbation 12/27/2023   Acute respiratory failure with hypoxia (HCC) 12/15/2023   Asthma 09/16/2023   Asthma with severe exacerbation 09/11/2023   Contusion of left elbow 12/16/2021   Acute severe exacerbation of asthma 05/14/2020   Asthma with status asthmaticus 06/04/2016   Acute hypokalemia 06/04/2016   Noncompliance with medication treatment due to underuse of medication 06/04/2016   Acute renal insufficiency 06/04/2016   Acute asthma exacerbation 06/04/2016   Dyspnea 03/16/2016   Acute exacerbation of severe persistent extrinsic asthma 03/16/2016   Severe persistent asthma 03/12/2014   Status asthmaticus 02/11/2014     Past Surgical History   Past Surgical History:  Procedure Laterality Date   NO PAST SURGERIES       Home Medications   Prior to Admission medications   Medication Sig Start Date End Date Taking? Authorizing Provider  acetaminophen  (TYLENOL ) 325 MG tablet Take 2 tablets (650 mg total) by mouth every 6 (six) hours as needed for mild pain (or Fever >/= 101). 05/27/19  Yes Vachhani, Vaibhavkumar, MD  albuterol  (VENTOLIN  HFA) 108 (90 Base) MCG/ACT inhaler  Inhale 2 puffs into the lungs every 4 (four) hours as needed for wheezing or shortness of breath. 11/23/23  Yes Luna Salinas, MD  fluticasone -salmeterol (ADVAIR HFA) 230-21 MCG/ACT inhaler Inhale 2 puffs into the lungs 2 (two) times daily. 12/14/23  Yes Dgayli, Berneta Brightly, MD  ipratropium-albuterol  (DUONEB) 0.5-2.5 (3) MG/3ML SOLN Take 3 mLs by nebulization every 6 (six) hours as needed (For shortness of breath and wheezing). Up to 4 times daily if needed for wheezing or shortness of breath. 11/23/23  Yes Luna Salinas, MD  predniSONE  (DELTASONE ) 20 MG tablet Take 20 mg by mouth daily. 11/29/23  Yes [provider]  Spacer/Aero-Holding Chambers (AEROCHAMBER MV) inhaler Use as instructed 12/14/23  Yes Dgayli, Berneta Brightly, MD  montelukast  (SINGULAIR ) 10 MG tablet Take 1 tablet (10 mg total) by mouth daily. 12/14/23 12/13/24  Vergia Glasgow, MD     Allergies  Patient has no known allergies.   Family History   Family History  Problem Relation Age of Onset   Arthritis Paternal Grandmother      Physical Exam  Triage Vital Signs: ED Triage Vitals  Encounter Vitals Group     BP      Systolic BP Percentile      Diastolic BP Percentile      Pulse      Resp      Temp      Temp src      SpO2  Weight      Height      Head Circumference      Peak Flow      Pain Score      Pain Loc      Pain Education      Exclude from Growth Chart     Updated Vital Signs: BP 129/67   Pulse (!) 116   Temp 98 F (36.7 C) (Axillary) Comment (Src): pt on bipap  Resp 17   SpO2 99%    General: Awake, moderate distress.  CV:  Tachycardic.  Good peripheral perfusion.  Resp:  Increased effort.  Diminished aeration. Abd:  Nontender.  No distention.  Other:  Bilateral calves are supple and nontender.   ED Results / Procedures / Treatments  Labs (all labs ordered are listed, but only abnormal results are displayed) Labs Reviewed  CBC WITH DIFFERENTIAL/PLATELET - Abnormal; Notable for the following  components:      Result Value   Eosinophils Absolute 1.1 (*)    All other components within normal limits  BASIC METABOLIC PANEL - Abnormal; Notable for the following components:   Glucose, Bld 163 (*)    Calcium 8.6 (*)    All other components within normal limits  MAGNESIUM  - Abnormal; Notable for the following components:   Magnesium  3.0 (*)    All other components within normal limits  RESP PANEL BY RT-PCR (RSV, FLU A&B, COVID)  [id number]  BASIC METABOLIC PANEL  CBC  TROPONIN I (HIGH SENSITIVITY)     EKG  ED ECG REPORT I, Israel Werts J, the attending physician, personally viewed and interpreted this ECG.   Date: 12/27/2023  EKG Time: 0108  Rate: 122  Rhythm: sinus tachycardia  Axis: Normal  Intervals:none  ST&T Change: Nonspecific    RADIOLOGY I have independently visualized and interpreted patient's imaging study as well as noted the radiology interpretation:  Chest x-ray: No acute cardiopulmonary process  Official radiology report(s): DG Chest Port 1 View Result Date: 12/27/2023 CLINICAL DATA:  sob EXAM: PORTABLE CHEST 1 VIEW COMPARISON:  Chest x-ray 12/15/2023 FINDINGS: The heart and mediastinal contours are within normal limits. No focal consolidation. No pulmonary edema. No pleural effusion. No pneumothorax. No acute osseous abnormality. IMPRESSION: No active disease. Electronically Signed   By: Morgane  Naveau M.D.   On: 12/27/2023 02:01     PROCEDURES:  Critical Care performed: Yes, see critical care procedure note(s) CRITICAL CARE Performed by: Norlene Beavers   Total critical care time: 45 minutes  Critical care time was exclusive of separately billable procedures and treating other patients.  Critical care was necessary to treat or prevent imminent or life-threatening deterioration.  Critical care was time spent personally by me on the following activities: development of treatment plan with patient and/or surrogate as well as nursing, discussions with  consultants, evaluation of patient's response to treatment, examination of patient, obtaining history from patient or surrogate, ordering and performing treatments and interventions, ordering and review of laboratory studies, ordering and review of radiographic studies, pulse oximetry and re-evaluation of patient's condition.   Aaron Aas1-3 Lead EKG Interpretation  Performed by: Norlene Beavers, MD Authorized by: Norlene Beavers, MD     Interpretation: abnormal     ECG rate:  125   ECG rate assessment: tachycardic     Rhythm: sinus tachycardia     Ectopy: none     Conduction: normal   Comments:     Patient placed on cardiac monitor to evaluate for arrhythmias  MEDICATIONS ORDERED IN ED: Medications  montelukast  (SINGULAIR ) tablet 10 mg (has no administration in time range)  ipratropium-albuterol  (DUONEB) 0.5-2.5 (3) MG/3ML nebulizer solution 3 mL (has no administration in time range)  guaiFENesin  (MUCINEX ) 12 hr tablet 600 mg (600 mg Oral Not Given 12/27/23 0337)  chlorpheniramine-HYDROcodone  (TUSSIONEX) 10-8 MG/5ML suspension 5 mL (has no administration in time range)  cefTRIAXone  (ROCEPHIN ) 1 g in sodium chloride  0.9 % 100 mL IVPB (0 g Intravenous Stopped 12/27/23 0510)  enoxaparin  (LOVENOX ) injection 40 mg (has no administration in time range)  0.9 %  sodium chloride  infusion ( Intravenous New Bag/Given 12/27/23 0325)  acetaminophen  (TYLENOL ) tablet 650 mg (has no administration in time range)    Or  acetaminophen  (TYLENOL ) suppository 650 mg (has no administration in time range)  traZODone  (DESYREL ) tablet 25 mg (has no administration in time range)  methylPREDNISolone  sodium succinate (SOLU-MEDROL ) 40 mg/mL injection 40 mg (has no administration in time range)    Followed by  predniSONE  (DELTASONE ) tablet 40 mg (has no administration in time range)  ipratropium-albuterol  (DUONEB) 0.5-2.5 (3) MG/3ML nebulizer solution 3 mL (has no administration in time range)  albuterol  (PROVENTIL ) (2.5  MG/3ML) 0.083% nebulizer solution 10 mg (10 mg Nebulization Given 12/27/23 0118)  sodium chloride  0.9 % bolus 1,000 mL (1,000 mLs Intravenous New Bag/Given 12/27/23 0148)     IMPRESSION / MDM / ASSESSMENT AND PLAN / ED COURSE  I reviewed the triage vital signs and the nursing notes.                             28 year old male presenting in respiratory distress. Differential includes, but is not limited to, viral syndrome, bronchitis including COPD exacerbation, pneumonia, reactive airway disease including asthma, CHF including exacerbation with or without pulmonary/interstitial edema, pneumothorax, ACS, thoracic trauma, and pulmonary embolism.  I have personally reviewed patient's records and note last hospitalization 12/15/2023 for same.  Patient's presentation is most consistent with acute presentation with potential threat to life or bodily function.  The patient is on the cardiac monitor to evaluate for evidence of arrhythmia and/or significant heart rate changes.  Patient switched immediately to ED BiPAP.  Will bleed in continuous albuterol  nebulizer.  Obtain cardiac and respiratory panels, chest x-ray.  Anticipate hospitalization.    FINAL CLINICAL IMPRESSION(S) / ED DIAGNOSES   Final diagnoses:  Severe persistent asthma with exacerbation  Respiratory distress     Rx / DC Orders   ED Discharge Orders     None        Note:  This document was prepared using Dragon voice recognition software and may include unintentional dictation errors.   Janae Bonser J, MD 12/27/23 [phone]

## 2023-12-27 NOTE — Progress Notes (Deleted)
 HPI Patient presents today to [facility] to see pharmacy team for Dupixent new start.  Past medical history includes ***  Number of hospitalizations in past year: Number of ***COPD/asthma exacerbations in past year:   Respiratory Medications Current regimen: *** Tried in past: *** Patient reports {Adherence challenges yes no:3044014::"adherence challenges","no known adherence challenges"}  OBJECTIVE No Known Allergies  Facility-Administered Encounter Medications as of 12/27/2023  Medication   0.9 %  sodium chloride infusion   acetaminophen (TYLENOL) tablet 650 mg   Or   acetaminophen (TYLENOL) suppository 650 mg   cefTRIAXone (ROCEPHIN) 1 g in sodium chloride 0.9 % 100 mL IVPB   chlorpheniramine-HYDROcodone (TUSSIONEX) 10-8 MG/5ML suspension 5 mL   enoxaparin (LOVENOX) injection 40 mg   guaiFENesin (MUCINEX) 12 hr tablet 600 mg   ipratropium-albuterol (DUONEB) 0.5-2.5 (3) MG/3ML nebulizer solution 3 mL   ipratropium-albuterol (DUONEB) 0.5-2.5 (3) MG/3ML nebulizer solution 3 mL   methylPREDNISolone sodium succinate (SOLU-MEDROL) 40 mg/mL injection 40 mg   Followed by   Melene Muller ON 12/28/2023] predniSONE (DELTASONE) tablet 40 mg   montelukast (SINGULAIR) tablet 10 mg   traZODone (DESYREL) tablet 25 mg   Outpatient Encounter Medications as of 12/27/2023  Medication Sig   acetaminophen (TYLENOL) 325 MG tablet Take 2 tablets (650 mg total) by mouth every 6 (six) hours as needed for mild pain (or Fever >/= 101).   albuterol (VENTOLIN HFA) 108 (90 Base) MCG/ACT inhaler Inhale 2 puffs into the lungs every 4 (four) hours as needed for wheezing or shortness of breath.   fluticasone-salmeterol (ADVAIR HFA) 230-21 MCG/ACT inhaler Inhale 2 puffs into the lungs 2 (two) times daily.   ipratropium-albuterol (DUONEB) 0.5-2.5 (3) MG/3ML SOLN Take 3 mLs by nebulization every 6 (six) hours as needed (For shortness of breath and wheezing). Up to 4 times daily if needed for wheezing or shortness  of breath.   montelukast (SINGULAIR) 10 MG tablet Take 1 tablet (10 mg total) by mouth daily.   predniSONE (DELTASONE) 20 MG tablet Take 20 mg by mouth daily.   Spacer/Aero-Holding Chambers (AEROCHAMBER MV) inhaler Use as instructed     Immunization History  Administered Date(s) Administered   DTaP 04/28/1996, 09/11/1996, 01/08/1997, 01/04/1998, 12/24/2000   HIB (PRP-OMP) 04/28/1996, 09/11/1996, 01/08/1997, 01/04/1998   HPV Quadrivalent 04/02/2011, 03/10/2012, 08/02/2013   Hepatitis A 07/09/2008, 04/02/2011   Hepatitis B Aug [birth year], 1996, 04/28/1996, 01/08/1997   IPV 04/28/1996, 09/11/1996, 02/28/1997, 01/04/1998, 12/24/2000   Influenza-Unspecified 08/09/2013   MMR 02/28/1997, 01/04/1998   Meningococcal Conjugate 07/09/2008, 08/02/2013   Pneumococcal Polysaccharide-23 06/05/2016   Td 07/09/2008   Tdap 07/09/2008   Varicella 01/04/1998, 03/10/2012     PFTs     No data to display           Eosinophils Most recent blood eosinophil count was *** cells/microL taken on ***.   IgE: *** on ***   Assessment   Biologics training for dupilumab (Dupixent)  Goals of therapy: Mechanism: human monoclonal IgG4 antibody that inhibits interleukin-4 and interleukin-13 cytokine-induced responses, including release of proinflammatory cytokines, chemokines, and IgE Reviewed that Dupixent is add-on medication and patient must continue maintenance inhaler regimen. Response to therapy: may take 4 months to determine efficacy. Discussed that patients generally feel improvement sooner than 4 months.  Side effects: injection site reaction (6-18%), antibody development (5-16%), ophthalmic conjunctivitis (2-16%), transient blood eosinophilia (1-2%)  Dose: {dupixentdose:25708}  Administration/Storage:  Reviewed administration sites of thigh or abdomen (at least 2-3 inches away from abdomen). Reviewed the upper arm is only appropriate if caregiver  is administering injection  Do not shake pen/syringe as  this could lead to product foaming or precipitation. Do not use if solution is discolored or contains particulate matter or if window on prefilled pen is yellow (indicates pen has been used).  Reviewed storage of medication in refrigerator. Reviewed that Dupixent can be stored at room temperature in unopened carton for up to 14 days.  Access: Approval of Dupixent through: {specialtycoverage:25706} Patient enrolled into copay card program to help with copay assistance.  Patient self-administered Dupixent 300mg /72ml x 2 (total dose 600mg ) in {injsitedsg:28167} and {injsitedsg:28167} using sample Dupixent 300mg /31mL autoinjector pen NDC: *** Lot: *** Expiration: ***  Patient monitored for 30 minutes for adverse reaction.  Patient tolerated ***.  Injection site noted. {injectionreaction:30756}  Medication Reconciliation  A drug regimen assessment was performed, including review of allergies, interactions, disease-state management, dosing and immunization history. Medications were reviewed with the patient, including name, instructions, indication, goals of therapy, potential side effects, importance of adherence, and safe use.  Drug interaction(s): ***  Immunizations  Patient is indicated for the influenzae, pneumonia, and shingles vaccinations. Patient has received *** COVID19 vaccines.   PLAN Continue Dupixent 300mg  every 14 days.  Next dose is due *** and every 14 days thereafter. Rx sent to: {SpecialtyPharmacies:25705}.  Patient provided with pharmacy phone number and advised to call later this week to schedule shipment to home. Patient provided with copay card information to provide to pharmacy if quoted copay exceeds $5 per month. Continue maintenance asthma regimen of: ***  All questions encouraged and answered.  Instructed patient to reach out with any further questions or concerns.  Thank you for allowing pharmacy to participate in this patient's care.  This appointment  required *** minutes of patient care (this includes precharting, chart review, review of results, face-to-face care, etc.).

## 2023-12-27 NOTE — ED Notes (Signed)
 MD made aware of pt refusal of lovenox .

## 2023-12-27 NOTE — Progress Notes (Signed)
   12/27/23 1005  Readmission Prevention Plan - High Risk  Transportation Screening Complete  PCP or Specialist appointment within 5-7 days of discharge Complete Clinical research associate will complete at time of d/c)  High Risk Social Work Consult for recovery care planning/counseling (includes patient and caregiver) Complete  High Risk Palliative Care Screening Not Applicable  Medication Review Complete   HRRA complete.   Admitted OZD:[id number] Edema  Admitted from: Home  PCP: John Schwartz  [facility] [address]  Current home health/prior home health/DME: None   No further TOC needs at this time.

## 2023-12-27 NOTE — ED Triage Notes (Addendum)
 BIBA for c/o resp distress. Pt arrives on CPAP. PTA pt used 6 duo and EMS gave 125 solumedrol, 2 G mag. RR 23 and labored.

## 2023-12-27 NOTE — Progress Notes (Signed)
 Pt taken off bipap, roomair sat 97%, respiratory rate 20/min, RN notified

## 2023-12-27 NOTE — Telephone Encounter (Signed)
 Patient re-hospitalized for asthma exacerbation. Will r/s Dupixent  new start when patient is discharged

## 2023-12-27 NOTE — H&P (Signed)
 [facility]   PATIENT NAME: John Schwartz    MR#:  [id number]  DATE OF BIRTH:  10-Jul-1996  DATE OF ADMISSION:  12/27/2023  PRIMARY CARE PHYSICIAN: Pcp, No   Patient is coming from: Home  REQUESTING/REFERRING PHYSICIAN: Meredith Stalls, MD  CHIEF COMPLAINT:   Chief Complaint  Patient presents with   Shortness of Breath    HISTORY OF PRESENT ILLNESS:  John Schwartz is a 28 y.o. African-American male with medical history significant for who presented to the emergency room with acute onset of acute asthma attack with dry cough, wheezing and dyspnea since yesterday. The patient used 2 DuoNebs at home with partial relief of his symptoms. EMS was called and staff gave him 125 mg of IV Solu-Medrol . He arrived to the ED on CPAP due to respiratory distress. He had a history of previous intubation and mechanical ventilation for asthma exacerbation in 2016 in addition to multiple hospitalization for asthma exacerbation. He admitted to chest tightness with his dyspnea. He denied any nausea or vomiting or diaphoresis or radiation of his chest tightness. No fever or chills. No dysuria, oliguria or hematuria or flank pain.  He took 6 DuoNebs at home.  He was then given 125 mg of IV Solu-Medrol  by EMS and 2 g of IV magnesium  sulfate and was placed on CPAP.  He has had several admissions for acute asthma exacerbation including recent 1/29 and on 1/6 of this year.  He has had previous history of intubations with his asthma exacerbation.  ED Course: Upon presentation to the emergency room, BP was 155/114 with heart rate of 127 and respiratory rate of 33 and +100% on 100% FiO2 on BiPAP.  Temperature was 97.9.  Labs revealed glucose of 163 and calcium 8.6 with magnesium  of 3 with otherwise normal BMP.  CBC was within normal.  Respiratory panel came back negative. EKG as reviewed by me :EKG showed sinus tachycardia with rate of 125 with PVCs. Imaging: Portable chest x-ray showed no acute cardiopulmonary  disease.  The patient was given 1 L bolus of IV normal saline here and nebulized albuterol  10 mg in addition to what was given by EMS.  He was placed on BiPAP.  He will be admitted to a progressive unit observation bed for further evaluation and management. PAST MEDICAL HISTORY:   Past Medical History:  Diagnosis Date   Asthma    Bronchospasm     PAST SURGICAL HISTORY:   Past Surgical History:  Procedure Laterality Date   NO PAST SURGERIES      SOCIAL HISTORY:   Social History   Tobacco Use   Smoking status: Never   Smokeless tobacco: Never  Substance Use Topics   Alcohol use: No    FAMILY HISTORY:   Family History  Problem Relation Age of Onset   Arthritis Paternal Grandmother     DRUG ALLERGIES:  No Known Allergies  REVIEW OF SYSTEMS:   ROS As per history of present illness. All pertinent systems were reviewed above. Constitutional, HEENT, cardiovascular, respiratory, GI, GU, musculoskeletal, neuro, psychiatric, endocrine, integumentary and hematologic systems were reviewed and are otherwise negative/unremarkable except for positive findings mentioned above in the HPI.   MEDICATIONS AT HOME:   Prior to Admission medications   Medication Sig Start Date End Date Taking? Authorizing Provider  acetaminophen  (TYLENOL ) 325 MG tablet Take 2 tablets (650 mg total) by mouth every 6 (six) hours as needed for mild pain (or Fever >/= 101). 05/27/19  Yes  Vachhani, Vaibhavkumar, MD  albuterol  (VENTOLIN  HFA) [phone] Base) MCG/ACT inhaler Inhale 2 puffs into the lungs every 4 (four) hours as needed for wheezing or shortness of breath. 11/23/23  Yes Amin, Sumayya, MD  fluticasone -salmeterol (ADVAIR HFA) 230-21 MCG/ACT inhaler Inhale 2 puffs into the lungs 2 (two) times daily. 12/14/23  Yes Dgayli, Berneta Brightly, MD  ipratropium-albuterol  (DUONEB) 0.5-2.5 (3) MG/3ML SOLN Take 3 mLs by nebulization every 6 (six) hours as needed (For shortness of breath and wheezing). Up to 4 times daily if  needed for wheezing or shortness of breath. 11/23/23  Yes Luna Salinas, MD  predniSONE  (DELTASONE ) 20 MG tablet Take 20 mg by mouth daily. 11/29/23  Yes [provider]  Spacer/Aero-Holding Chambers (AEROCHAMBER MV) inhaler Use as instructed 12/14/23  Yes Dgayli, Berneta Brightly, MD  montelukast  (SINGULAIR ) 10 MG tablet Take 1 tablet (10 mg total) by mouth daily. 12/14/23 12/13/24  Vergia Glasgow, MD      VITAL SIGNS:  Blood pressure (!) 147/91, pulse (!) 119, temperature 97.9 F (36.6 C), temperature source Oral, resp. rate 19, SpO2 100%.  PHYSICAL EXAMINATION:  Physical Exam  GENERAL:   Acutely ill 28 y.o.-year-old patient lying in the bed with my respiratory distress with conversational dyspnea,  on BiPAP   EYES: Pupils equal, round, reactive to light and accommodation. No scleral icterus. Extraocular muscles intact.  HEENT: Head atraumatic, normocephalic. Oropharynx and nasopharynx clear.  NECK:  Supple, no jugular venous distention. No thyroid  enlargement, no tenderness.  LUNGS: Diffuse expiratory wheezes with tight expiratory airflow and harsh vesicular breathing.. No use of accessory muscles of respiration.  CARDIOVASCULAR: Regular rate and rhythm, S1, S2 normal. No murmurs, rubs, or gallops.  ABDOMEN: Soft, nondistended, nontender. Bowel sounds present. No organomegaly or mass.  EXTREMITIES: No pedal edema, cyanosis, or clubbing.  NEUROLOGIC: Cranial nerves II through XII are intact. Muscle strength 5/5 in all extremities. Sensation intact. Gait not checked.  PSYCHIATRIC: The patient is alert and oriented x 3.  Normal affect and good eye contact. SKIN: No obvious rash, lesion, or ulcer.   LABORATORY PANEL:   CBC Recent Labs  Lab 12/27/23 0058  WBC 9.9  HGB 13.9  HCT 41.9  PLT 271   ------------------------------------------------------------------------------------------------------------------  Chemistries  Recent Labs  Lab 12/27/23 0058  NA 137  K 3.5  CL 103  CO2  23  GLUCOSE 163*  BUN 10  CREATININE 1.20  CALCIUM 8.6*  MG 3.0*   ------------------------------------------------------------------------------------------------------------------  Cardiac Enzymes No results for input(s): "TROPONINI" in the last 168 hours. ------------------------------------------------------------------------------------------------------------------  RADIOLOGY:  DG Chest Port 1 View Result Date: 12/27/2023 CLINICAL DATA:  sob EXAM: PORTABLE CHEST 1 VIEW COMPARISON:  Chest x-ray 12/15/2023 FINDINGS: The heart and mediastinal contours are within normal limits. No focal consolidation. No pulmonary edema. No pleural effusion. No pneumothorax. No acute osseous abnormality. IMPRESSION: No active disease. Electronically Signed   By: Morgane  Naveau M.D.   On: 12/27/2023 02:01      IMPRESSION AND PLAN:  Assessment and Plan:  * Acute respiratory failure with hypoxia (HCC) - The patient will be admitted to the progressive unit bed. - The patient was initially on BiPAP that was later tapered off. - O2 protocol be followed. - Management otherwise as below.   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 - We will continue bronchodilator therapy with scheduled and as needed DuoNebs. - We will resume Singulair . - We will monitor O2. - We will hold off long-acting  beta agonist. - Will resume anticholinergic bronchodilator therapy.       DVT prophylaxis: Lovenox . Advanced Care Planning:  Code Status: full code. Family Communication:  The plan of care was discussed in details with the patient (and family). I answered all questions. The patient agreed to proceed with the above mentioned plan. Further management will depend upon hospital course. Disposition Plan: Back to previous home environment Consults called: none. All the records are reviewed and case discussed with ED provider.   Status is:  observation  I certify that at the time of admission, it is my clinical judgment that the patient will require hospital care extending less than 2 midnights.                            Dispo: The patient is from: Home              Anticipated d/c is to: Home              Patient currently is not medically stable to d/c.              Difficult to place patient: No   Authorized and performed by: Amalia Jung, MD Total critical care time:   40     minutes. Due to a high probability of clinically significant, life-threatening deterioration, the patient required my highest level of preparedness to intervene emergently and I personally spent this critical care time directly and personally managing the patient.  This critical care time included obtaining a history, examining the patient, pulse oximetry, ordering and review of studies, arranging urgent treatment with development of management plan, evaluation of patient's response to treatment, frequent reassessment, and discussions with other providers. This critical care time was performed to assess and manage the high probability of imminent, life-threatening deterioration that could result in multiorgan failure.  It was exclusive of separately billable procedures and treating other patients and teaching time.  Virgene Griffin M.D on 12/27/2023 at 5:03 AM  [facility]   From 7 PM-7 AM, contact night-coverage [email or web address]  CC: Primary care physician; Pcp, No

## 2023-12-27 NOTE — Progress Notes (Signed)
 Courtesy visit No billing-  Patient is seen and examined today morning.  He is admitted for acute asthma exacerbation.  Patient was initially placed on BiPAP currently his oxygen  tapered off.  Patient feels better than when he came in.  He is still tachycardic and tachypneic at times.  Physical exam pertinent to lungs with diffuse wheezes, mild shortness of breath noted.  Patient will be continued on DuoNebs, steroid taper therapy. I advised him to get out of bed.  He refused to take Lovenox  shots per RN.  Did discuss with him the current plan and once he starts improving, moving around without shortness of breath we will plan to discharge.

## 2023-12-28 ENCOUNTER — Other Ambulatory Visit: Payer: Self-pay

## 2023-12-28 DIAGNOSIS — J4551 Severe persistent asthma with (acute) exacerbation: Secondary | ICD-10-CM | POA: Diagnosis not present

## 2023-12-28 DIAGNOSIS — E669 Obesity, unspecified: Secondary | ICD-10-CM

## 2023-12-28 DIAGNOSIS — J9601 Acute respiratory failure with hypoxia: Secondary | ICD-10-CM | POA: Diagnosis not present

## 2023-12-28 MED ORDER — HYDROCOD POLI-CHLORPHE POLI ER 10-8 MG/5ML PO SUER
5.0000 mL | Freq: Two times a day (BID) | ORAL | 0 refills | Status: DC | PRN
  Filled 2023-12-28: qty 115, 12d supply, fill #0

## 2023-12-28 MED ORDER — PREDNISONE 20 MG PO TABS
40.0000 mg | ORAL_TABLET | Freq: Every day | ORAL | 0 refills | Status: AC
  Filled 2023-12-28: qty 10, 5d supply, fill #0

## 2023-12-28 MED ORDER — GUAIFENESIN ER 600 MG PO TB12
600.0000 mg | ORAL_TABLET | Freq: Two times a day (BID) | ORAL | 0 refills | Status: AC
Start: 2023-12-28 — End: 2024-01-07
  Filled 2023-12-28: qty 20, 10d supply, fill #0

## 2023-12-28 NOTE — Discharge Summary (Signed)
Physician Discharge Summary   Patient: John Schwartz MRN: [id number] DOB: 1995/12/30  Admit date:     12/27/2023  Discharge date: 12/28/23  Discharge Physician: Marcelino Duster   PCP: Pcp, No   Recommendations at discharge:   PCP follow up in 1 week. Pulmonology follow up as scheduled.  Discharge Diagnoses: Principal Problem:   Acute respiratory failure with hypoxia (HCC) Active Problems:   Acute asthma exacerbation   Severe persistent asthma with exacerbation  Resolved Problems:   * No resolved hospital problems. *  Hospital Course: John Schwartz is a 28 y.o. African-American male with medical history significant for who presented to the emergency room with acute onset of acute asthma attack with dry cough, wheezing and dyspnea since 1 day.  Upon presentation to the emergency room, BP was 155/114 with heart rate of 127 and respiratory rate of 33 and +100% on 100% FiO2 on BiPAP. Temperature was 97.9. Labs revealed glucose of 163 and calcium 8.6 with magnesium of 3 with otherwise normal BMP. CBC was within normal. Respiratory panel came back negative.   Patient is admitted to hospitalist service for acute asthma exacerbation. He is started on IV solumedrol, duonebs, supplemental oxygen. His steroids tapered down to oral therapy. His oxygen tapered off, able to ambulate without shortness of breath. I advised him to follow up with PCP and pulmonology as scheduled. 5 days of 40 mg prednisone given after which he will continue his oral 20mg  dose. He understands and agrees with discharge plan,        Consultants: none. Procedures performed: none.  Disposition: Home Diet recommendation:  Discharge Diet Orders (From admission, onward)     Start     Ordered   12/28/23 0000  Diet - low sodium heart healthy        12/28/23 1046           Cardiac and Carb modified diet DISCHARGE MEDICATION: Allergies as of 12/28/2023   No Known Allergies      Medication List      PAUSE taking these medications    predniSONE 20 MG tablet Wait to take this until: January 02, 2024 Commonly known as: DELTASONE Take 20 mg by mouth daily. You also have another medication with the same name that you may need to continue taking.       TAKE these medications    acetaminophen 325 MG tablet Commonly known as: TYLENOL Take 2 tablets (650 mg total) by mouth every 6 (six) hours as needed for mild pain (or Fever >/= 101).   AeroChamber MV inhaler Use as instructed   albuterol 108 (90 Base) MCG/ACT inhaler Commonly known as: VENTOLIN HFA Inhale 2 puffs into the lungs every 4 (four) hours as needed for wheezing or shortness of breath.   chlorpheniramine-HYDROcodone 10-8 MG/5ML Commonly known as: TUSSIONEX Take 5 mLs by mouth every 12 (twelve) hours as needed for cough.   fluticasone-salmeterol 230-21 MCG/ACT inhaler Commonly known as: Advair HFA Inhale 2 puffs into the lungs 2 (two) times daily.   guaiFENesin 600 MG 12 hr tablet Commonly known as: MUCINEX Take 1 tablet (600 mg total) by mouth 2 (two) times daily for 10 days.   ipratropium-albuterol 0.5-2.5 (3) MG/3ML Soln Commonly known as: DUONEB Take 3 mLs by nebulization every 6 (six) hours as needed (For shortness of breath and wheezing). Up to 4 times daily if needed for wheezing or shortness of breath.   montelukast 10 MG tablet Commonly known as: SINGULAIR Take 1 tablet (  10 mg total) by mouth daily.   predniSONE 20 MG tablet Commonly known as: DELTASONE Take 2 tablets (40 mg total) by mouth daily with breakfast for 5 days. Complete 5 days of 40mg  and then go back to home dose 20mg  daily Start taking on: December 29, 2023 What changed: Another medication with the same name was paused. Ask your nurse or doctor if you should take this medication.        Discharge Exam:    12/28/2023   11:00 AM 12/28/2023   10:00 AM 12/28/2023    9:17 AM  Vitals with BMI  Systolic 123 116 161  Diastolic 63 72  72  Pulse 85 84 84    General - Young  obese African American male, no apparent distress HEENT - PERRLA, EOMI, atraumatic head, non tender sinuses. Lung - Clear, no wheezes, no rales. Heart - S1, S2 heard, no murmurs, rubs, no pedal edema. Abdomen - Soft, non tender, obese, bowel sounds good Neuro - Alert, awake and oriented x 3, non focal exam. Skin - Warm and dry.  Condition at discharge: stable  The results of significant diagnostics from this hospitalization (including imaging, microbiology, ancillary and laboratory) are listed below for reference.   Imaging Studies: DG Chest Port 1 View Result Date: 12/27/2023 CLINICAL DATA:  sob EXAM: PORTABLE CHEST 1 VIEW COMPARISON:  Chest x-ray 12/15/2023 FINDINGS: The heart and mediastinal contours are within normal limits. No focal consolidation. No pulmonary edema. No pleural effusion. No pneumothorax. No acute osseous abnormality. IMPRESSION: No active disease. Electronically Signed   By: Tish Frederickson M.D.   On: 12/27/2023 02:01   DG Chest Port 1 View Result Date: 12/15/2023 CLINICAL DATA:  Shortness of breath EXAM: PORTABLE CHEST 1 VIEW COMPARISON:  Chest x-ray 11/22/2023 FINDINGS: The heart size and mediastinal contours are within normal limits. Both lungs are clear. The visualized skeletal structures are unremarkable. IMPRESSION: No active disease. Electronically Signed   By: Darliss Cheney M.D.   On: 12/15/2023 02:24    Microbiology: Results for orders placed or performed during the hospital encounter of 12/27/23  Resp panel by RT-PCR (RSV, Flu A&B, Covid) Anterior Nasal Swab     Status: None   Collection Time: 12/27/23 12:50 AM   Specimen: Anterior Nasal Swab  Result Value Ref Range Status   SARS Coronavirus 2 by RT PCR NEGATIVE NEGATIVE Final    Comment: (NOTE) SARS-CoV-2 target nucleic acids are NOT DETECTED.  The SARS-CoV-2 RNA is generally detectable in upper respiratory specimens during the acute phase of infection. The  lowest concentration of SARS-CoV-2 viral copies this assay can detect is 138 copies/mL. A negative result does not preclude SARS-Cov-2 infection and should not be used as the sole basis for treatment or other patient management decisions. A negative result may occur with  improper specimen collection/handling, submission of specimen other than nasopharyngeal swab, presence of viral mutation(s) within the areas targeted by this assay, and inadequate number of viral copies(<138 copies/mL). A negative result must be combined with clinical observations, patient history, and epidemiological information. The expected result is Negative.  Fact Sheet for Patients:  BloggerCourse.com  Fact Sheet for Healthcare Providers:  SeriousBroker.it  This test is no t yet approved or cleared by the [place] FDA and  has been authorized for detection and/or diagnosis of SARS-CoV-2 by FDA under an Emergency Use Authorization (EUA). This EUA will remain  in effect (meaning this test can be used) for the duration of the COVID-19 declaration under  Section 564(b)(1) of the Act, 21 U.S.C.section 360bbb-3(b)(1), unless the authorization is terminated  or revoked sooner.       Influenza A by PCR NEGATIVE NEGATIVE Final   Influenza B by PCR NEGATIVE NEGATIVE Final    Comment: (NOTE) The Xpert Xpress SARS-CoV-2/FLU/RSV plus assay is intended as an aid in the diagnosis of influenza from Nasopharyngeal swab specimens and should not be used as a sole basis for treatment. Nasal washings and aspirates are unacceptable for Xpert Xpress SARS-CoV-2/FLU/RSV testing.  Fact Sheet for Patients: BloggerCourse.com  Fact Sheet for Healthcare Providers: SeriousBroker.it  This test is not yet approved or cleared by the [place] FDA and has been authorized for detection and/or diagnosis of SARS-CoV-2 by FDA under  an Emergency Use Authorization (EUA). This EUA will remain in effect (meaning this test can be used) for the duration of the COVID-19 declaration under Section 564(b)(1) of the Act, 21 U.S.C. section 360bbb-3(b)(1), unless the authorization is terminated or revoked.     Resp Syncytial Virus by PCR NEGATIVE NEGATIVE Final    Comment: (NOTE) Fact Sheet for Patients: BloggerCourse.com  Fact Sheet for Healthcare Providers: SeriousBroker.it  This test is not yet approved or cleared by the [place] FDA and has been authorized for detection and/or diagnosis of SARS-CoV-2 by FDA under an Emergency Use Authorization (EUA). This EUA will remain in effect (meaning this test can be used) for the duration of the COVID-19 declaration under Section 564(b)(1) of the Act, 21 U.S.C. section 360bbb-3(b)(1), unless the authorization is terminated or revoked.  Performed at [facility], [address] [place], [place] [address]     Labs: CBC: Recent Labs  Lab 12/27/23 0058 12/27/23 0601  WBC 9.9 11.9*  NEUTROABS 5.9  --   HGB 13.9 13.6  HCT 41.9 41.3  MCV 86.7 86.6  PLT 271 268   Basic Metabolic Panel: Recent Labs  Lab 12/27/23 0058 12/27/23 0601  NA 137 137  K 3.5 4.5  CL 103 105  CO2 23 22  GLUCOSE 163* 152*  BUN 10 11  CREATININE 1.20 1.33*  CALCIUM 8.6* 8.4*  MG 3.0*  --    Liver Function Tests: No results for input(s): "AST", "ALT", "ALKPHOS", "BILITOT", "PROT", "ALBUMIN" in the last 168 hours. CBG: No results for input(s): "GLUCAP" in the last 168 hours.  Discharge time spent: 34 minutes.  Signed: Marcelino Duster, MD [facility] 12/28/2023

## 2023-12-29 ENCOUNTER — Ambulatory Visit: Admission: RE | Admit: 2023-12-29 | Payer: Medicaid Other | Source: Ambulatory Visit

## 2023-12-30 ENCOUNTER — Ambulatory Visit: Payer: Medicaid Other | Admitting: Pharmacist

## 2023-12-30 DIAGNOSIS — Z7189 Other specified counseling: Secondary | ICD-10-CM

## 2023-12-30 DIAGNOSIS — J4550 Severe persistent asthma, uncomplicated: Secondary | ICD-10-CM

## 2023-12-30 MED ORDER — DUPIXENT 300 MG/2ML ~~LOC~~ SOAJ
300.0000 mg | SUBCUTANEOUS | 1 refills | Status: DC
  Filled 2024-01-05: qty 4, 28d supply, fill #0
  Filled 2024-01-27 (×2): qty 4, 28d supply, fill #1
  Filled 2024-03-01 (×2): qty 4, 28d supply, fill #2
  Filled 2024-04-07: qty 4, 28d supply, fill #3
  Filled 2024-05-08: qty 4, 28d supply, fill #4
  Filled 2024-05-30: qty 4, 28d supply, fill #5

## 2023-12-30 NOTE — Patient Instructions (Signed)
Your next DUPIXENT dose is due on 01/13/2024, 01/27/2024, and every 14 days thereafter  CONTINUE Advair 2 puffs twice daily, montelukast 10mg  nightly COMPLETE prednisone taper as prescribed   Your prescription will be shipped from [facility]. Their phone number is [phone] [facility] will call to schedule shipment and confirm address. They will mail your medication to your home.  You will need to be seen by your provider in 3 to 4 months to assess how DUPIXENT is working for you. Please ensure you have a follow-up appointment scheduled in May or June 2025. Call our clinic if you need to make this appointment.  Stay up to date on all routine vaccines: influenza, pneumonia, COVID19, Shingles  How to manage an injection site reaction: Remember the 5 C's: COUNTER - leave on the counter at least 30 minutes but up to overnight to bring medication to room temperature. This may help prevent stinging COLD - place something cold (like an ice gel pack or cold water bottle) on the injection site just before cleansing with alcohol. This may help reduce pain CLARITIN - use Claritin (generic name is loratadine) for the first two weeks of treatment or the day of, the day before, and the day after injecting. This will help to minimize injection site reactions CORTISONE CREAM - apply if injection site is irritated and itching CALL ME - if injection site reaction is bigger than the size of your fist, looks infected, blisters, or if you develop hives

## 2023-12-30 NOTE — Progress Notes (Unsigned)
HPI Patient presents today to [facility] to see pharmacy team for Dupixent new start.  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 Patient was intubated   Respiratory Medications Current regimen: *** Tried in past: *** Patient reports {Adherence challenges yes no:[id number]::"adherence challenges","no known adherence challenges"}  OBJECTIVE No Known Allergies  Outpatient Encounter Medications as of 12/30/2023  Medication Sig   acetaminophen (TYLENOL) 325 MG tablet Take 2 tablets (650 mg total) by mouth every 6 (six) hours as needed for mild pain (or Fever >/= 101).   albuterol (VENTOLIN HFA) 108 (90 Base) MCG/ACT inhaler Inhale 2 puffs into the lungs every 4 (four) hours as needed for wheezing or shortness of breath.   chlorpheniramine-HYDROcodone (TUSSIONEX) 10-8 MG/5ML Take 5 mLs by mouth every 12 (twelve) hours as needed for cough.   fluticasone-salmeterol (ADVAIR HFA) 230-21 MCG/ACT inhaler Inhale 2 puffs into the lungs 2 (two) times daily.   guaiFENesin (MUCINEX) 600 MG 12 hr tablet Take 1 tablet (600 mg total) by mouth 2 (two) times daily for 10 days.   ipratropium-albuterol (DUONEB) 0.5-2.5 (3) MG/3ML SOLN Take 3 mLs by nebulization every 6 (six) hours as needed (For shortness of breath and wheezing). Up to 4 times daily if needed for wheezing or shortness of breath.   montelukast (SINGULAIR) 10 MG tablet Take 1 tablet (10 mg total) by mouth daily.   [Paused] predniSONE (DELTASONE) 20 MG tablet Take 20 mg by mouth daily.   predniSONE (DELTASONE) 20 MG tablet Take 2 tablets (40 mg total) by mouth daily with breakfast for 5 days. Complete 5 days of 40mg  and then go back to home dose 20mg  daily   Spacer/Aero-Holding Chambers (AEROCHAMBER MV) inhaler Use as instructed   No facility-administered encounter medications on file as of 12/30/2023.     Immunization History  Administered  Date(s) Administered   DTaP 04/28/1996, 09/11/1996, 01/08/1997, 01/04/1998, 12/24/2000   HIB (PRP-OMP) 04/28/1996, 09/11/1996, 01/08/1997, 01/04/1998   HPV Quadrivalent 04/02/2011, 03/10/2012, 08/02/2013   Hepatitis A 07/09/2008, 04/02/2011   Hepatitis B 1996-09[birth year]02, 04/28/1996, 01/08/1997   IPV 04/28/1996, 09/11/1996, 02/28/1997, 01/04/1998, 12/24/2000   Influenza-Unspecified 08/09/2013   MMR 02/28/1997, 01/04/1998   Meningococcal Conjugate 07/09/2008, 08/02/2013   Pneumococcal Polysaccharide-23 06/05/2016   Td 07/09/2008   Tdap 07/09/2008   Varicella 01/04/1998, 03/10/2012     PFTs     No data to display           Eosinophils Most recent blood eosinophil count was *** cells/microL taken on ***.   IgE: *** on ***   Assessment   Biologics training for dupilumab (Dupixent)  Goals of therapy: Mechanism: human monoclonal IgG4 antibody that inhibits interleukin-4 and interleukin-13 cytokine-induced responses, including release of proinflammatory cytokines, chemokines, and IgE Reviewed that Dupixent is add-on medication and patient must continue maintenance inhaler regimen. Response to therapy: may take 4 months to determine efficacy. Discussed that patients generally feel improvement sooner than 4 months.  Side effects: injection site reaction (6-18%), antibody development (5-16%), ophthalmic conjunctivitis (2-16%), transient blood eosinophilia (1-2%)  Dose: 600mg  at Week 0 (administered today in clinic) followed by 300mg  every 14 days thereafter  Administration/Storage:  Reviewed administration sites of thigh or abdomen (at least 2-3 inches away from abdomen). Reviewed the upper arm is only appropriate if caregiver is administering injection  Do not shake pen/syringe as this could lead to product foaming or precipitation. Do not use if solution is discolored or contains particulate  matter or if window on prefilled pen is yellow (indicates pen has been used).  Reviewed  storage of medication in refrigerator. Reviewed that Dupixent can be stored at room temperature in unopened carton for up to 14 days.  Access: Approval of Dupixent through: insurance  Patient self-administered Dupixent 300mg /18ml x 2 (total dose 600mg ) in right upper thigh and left upper thigh using sample Dupixent 300mg /65mL autoinjector pen NDC: [id number] Lot: [id number] Expiration: 09/15/2025  Patient monitored for 30 minutes for adverse reaction.  Patient tolerated well - some discomfort but feels confident that he can administer at home.  Injection site noted. Patient denies itchiness and irritation at injection., No swelling or redness noted., and Reviewed injection site reaction management with patient verbally and printed information for review in AVS  Medication Reconciliation  A drug regimen assessment was performed, including review of allergies, interactions, disease-state management, dosing and immunization history. Medications were reviewed with the patient, including name, instructions, indication, goals of therapy, potential side effects, importance of adherence, and safe use.  Drug interaction(s): none noted  PLAN Continue Dupixent 300mg  every 14 days.  Next dose is due 01/13/2024 and every 14 days thereafter. Rx sent to: [facility]: [phone] .  Patient provided with pharmacy phone number and advised that Mills Koller will call to schedule first shipment to  home Continue maintenance asthma regimen of: ***  All questions encouraged and answered.  Instructed patient to reach out with any further questions or concerns.  Thank you for allowing pharmacy to participate in this patient's care.  This appointment required *** minutes of patient care (this includes precharting, chart review, review of results, face-to-face care, etc.).

## 2023-12-31 ENCOUNTER — Other Ambulatory Visit: Payer: Self-pay | Admitting: Pharmacist

## 2023-12-31 MED ORDER — ALBUTEROL SULFATE HFA 108 (90 BASE) MCG/ACT IN AERS: 2.0000 | INHALATION_SPRAY | RESPIRATORY_TRACT | 2 refills | Status: AC | PRN

## 2023-12-31 NOTE — Addendum Note (Signed)
Addended by: Murrell Redden on: 12/31/2023 10:09 AM   Modules accepted: Orders

## 2023-12-31 NOTE — Progress Notes (Signed)
Patient with six hospitalizations/ED visits since Oct 2024. Patient has history of intubation and mechanical ventilation. Has had asthma since childhood. His asthma is responsive to steroids and labs show eosinophilic phenotype as well.   Continue Dupixent 300mg  SQ every 14 days Continue Advair HFA 231-21 mcg (2 pfufs twice daily), Continue montelukast 10mg  nightly   Chesley Mires, PharmD, MPH, BCPS, CPP Clinical Pharmacist (Rheumatology and Pulmonology)

## 2024-01-04 ENCOUNTER — Ambulatory Visit: Payer: Medicaid Other | Attending: Student in an Organized Health Care Education/Training Program

## 2024-01-05 ENCOUNTER — Other Ambulatory Visit: Payer: Self-pay

## 2024-01-05 ENCOUNTER — Other Ambulatory Visit (HOSPITAL_COMMUNITY): Payer: Self-pay

## 2024-01-05 ENCOUNTER — Ambulatory Visit: Payer: Medicaid Other | Admitting: Student in an Organized Health Care Education/Training Program

## 2024-01-05 NOTE — Progress Notes (Signed)
 Specialty Pharmacy Initial Fill Coordination Note  John Schwartz is a 28 y.o. male contacted today regarding initial fill of specialty medication(s) Dupilumab (Dupixent)   Patient requested Delivery   Delivery date: 01/11/24   Verified address: [address]   [place] [address]   Medication will be filled on 01/10/24.   Patient is aware of $4 copayment. CC info collected and forwarded to Tamra at [facility].

## 2024-01-12 ENCOUNTER — Ambulatory Visit: Payer: Medicaid Other | Admitting: Student in an Organized Health Care Education/Training Program

## 2024-01-25 ENCOUNTER — Other Ambulatory Visit (HOSPITAL_COMMUNITY): Payer: Self-pay

## 2024-01-27 ENCOUNTER — Other Ambulatory Visit (HOSPITAL_COMMUNITY): Payer: Self-pay

## 2024-01-27 ENCOUNTER — Other Ambulatory Visit: Payer: Self-pay

## 2024-01-27 NOTE — Progress Notes (Signed)
 Specialty Pharmacy Refill Coordination Note  John Schwartz is a 28 y.o. male contacted today regarding refills of specialty medication(s) No data recorded  Patient requested (Patient-Rptd) Delivery   Delivery date: (Patient-Rptd) 02/03/24   Verified address: (Patient-Rptd) [address]   Medication will be filled on 02/02/24.

## 2024-02-02 ENCOUNTER — Other Ambulatory Visit: Payer: Self-pay

## 2024-02-08 ENCOUNTER — Encounter: Payer: Self-pay | Admitting: Student in an Organized Health Care Education/Training Program

## 2024-02-29 ENCOUNTER — Other Ambulatory Visit: Payer: Self-pay

## 2024-03-01 ENCOUNTER — Other Ambulatory Visit (HOSPITAL_COMMUNITY): Payer: Self-pay

## 2024-03-01 ENCOUNTER — Other Ambulatory Visit: Payer: Self-pay

## 2024-03-01 NOTE — Progress Notes (Signed)
 Specialty Pharmacy Refill Coordination Note  John Schwartz is a 28 y.o. male contacted today regarding refills of specialty medication(s) Dupixent.  Patient requested (Patient-Rptd) Delivery   Delivery date: (Patient-Rptd) 03/06/24   Verified address: (Patient-Rptd) [address]   Medication will be filled on 03/06/24. New delivery date is 03/07/24. Patient has been notified.

## 2024-03-06 ENCOUNTER — Other Ambulatory Visit: Payer: Self-pay

## 2024-03-09 ENCOUNTER — Emergency Department
Admission: EM | Admit: 2024-03-09 | Discharge: 2024-03-09 | Disposition: A | Discharge status: Home / Self Care | Attending: Emergency Medicine | Admitting: Emergency Medicine

## 2024-03-09 ENCOUNTER — Encounter: Payer: Self-pay | Admitting: *Deleted

## 2024-03-09 ENCOUNTER — Emergency Department

## 2024-03-09 ENCOUNTER — Other Ambulatory Visit: Payer: Self-pay

## 2024-03-09 DIAGNOSIS — Y92410 Unspecified street and highway as the place of occurrence of the external cause: Secondary | ICD-10-CM | POA: Insufficient documentation

## 2024-03-09 DIAGNOSIS — S60511A Abrasion of right hand, initial encounter: Secondary | ICD-10-CM | POA: Diagnosis not present

## 2024-03-09 DIAGNOSIS — S50811A Abrasion of right forearm, initial encounter: Secondary | ICD-10-CM | POA: Insufficient documentation

## 2024-03-09 DIAGNOSIS — S82892A Other fracture of left lower leg, initial encounter for closed fracture: Secondary | ICD-10-CM

## 2024-03-09 DIAGNOSIS — S80211A Abrasion, right knee, initial encounter: Secondary | ICD-10-CM | POA: Insufficient documentation

## 2024-03-09 DIAGNOSIS — S50812A Abrasion of left forearm, initial encounter: Secondary | ICD-10-CM | POA: Insufficient documentation

## 2024-03-09 DIAGNOSIS — S60512A Abrasion of left hand, initial encounter: Secondary | ICD-10-CM | POA: Insufficient documentation

## 2024-03-09 DIAGNOSIS — J45909 Unspecified asthma, uncomplicated: Secondary | ICD-10-CM | POA: Diagnosis not present

## 2024-03-09 DIAGNOSIS — S99912A Unspecified injury of left ankle, initial encounter: Secondary | ICD-10-CM | POA: Diagnosis present

## 2024-03-09 DIAGNOSIS — S82425A Nondisplaced transverse fracture of shaft of left fibula, initial encounter for closed fracture: Secondary | ICD-10-CM | POA: Insufficient documentation

## 2024-03-09 DIAGNOSIS — S80212A Abrasion, left knee, initial encounter: Secondary | ICD-10-CM | POA: Diagnosis not present

## 2024-03-09 DIAGNOSIS — T07XXXA Unspecified multiple injuries, initial encounter: Secondary | ICD-10-CM

## 2024-03-09 MED ORDER — HYDROCODONE-ACETAMINOPHEN 5-325 MG PO TABS
2.0000 | ORAL_TABLET | Freq: Once | ORAL | Status: AC
  Administered 2024-03-09: 2 via ORAL
  Filled 2024-03-09: qty 2

## 2024-03-09 MED ORDER — HYDROCODONE-ACETAMINOPHEN 5-325 MG PO TABS: 1.0000 | ORAL_TABLET | ORAL | 0 refills | Status: DC | PRN

## 2024-03-09 MED ORDER — ONDANSETRON 4 MG PO TBDP
4.0000 mg | ORAL_TABLET | Freq: Once | ORAL | Status: AC
  Administered 2024-03-09: 4 mg via ORAL
  Filled 2024-03-09: qty 1

## 2024-03-09 MED ORDER — CEPHALEXIN 500 MG PO CAPS: 500.0000 mg | ORAL_CAPSULE | Freq: Two times a day (BID) | ORAL | 0 refills | Status: DC
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
## 2024-03-09 NOTE — ED Notes (Signed)
 Pt sitting in lobby, diaphoretic, c/o nausea; reviewed triage--acuity level changed and pt taken to room 26 by Gwenith Lente RN to be evaluated further

## 2024-03-09 NOTE — Discharge Instructions (Addendum)
 Please call the number provided for orthopedics to arrange a follow-up appointment.  Please use your crutches do not place any weight on your left ankle.  Return to the emergency department for any worsening pain, fever or any other symptom concerning to yourself.  Please leave the dressings in place for the next 2 days then you may change dressings after cleaning with soap and water.  Do not change the ankle dressing until you are seen by orthopedics.
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
## 2024-03-13 ENCOUNTER — Other Ambulatory Visit: Payer: Self-pay | Admitting: Surgery

## 2024-03-15 ENCOUNTER — Ambulatory Visit: Admission: RE | Admit: 2024-03-15 | Source: Home / Self Care | Admitting: Surgery

## 2024-03-15 ENCOUNTER — Encounter: Admission: RE | Payer: Self-pay | Source: Home / Self Care

## 2024-03-15 ENCOUNTER — Encounter: Payer: Self-pay | Admitting: Anesthesiology

## 2024-03-15 SURGERY — OPEN REDUCTION INTERNAL FIXATION (ORIF) ANKLE FRACTURE
Anesthesia: Choice | Site: Ankle | Laterality: Left

## 2024-03-15 MED ORDER — CEFAZOLIN SODIUM-DEXTROSE 2-4 GM/100ML-% IV SOLN
INTRAVENOUS | Status: AC
  Filled 2024-03-15: qty 100

## 2024-03-15 MED ORDER — CHLORHEXIDINE GLUCONATE 0.12 % MT SOLN
OROMUCOSAL | Status: AC
  Filled 2024-03-15: qty 15
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
## 2024-03-16 ENCOUNTER — Ambulatory Visit: Attending: Student in an Organized Health Care Education/Training Program

## 2024-03-19 ENCOUNTER — Telehealth: Admitting: Family Medicine

## 2024-03-19 DIAGNOSIS — S82829S Torus fracture of lower end of unspecified fibula, sequela: Secondary | ICD-10-CM

## 2024-03-19 NOTE — Progress Notes (Signed)
 Pt is requesting pain meds- previously on hydrocodone  needing something stronger. Advised we do not prescribe pain medications and he will need to cal his orthopedist or go to UC for severe pain. DWB

## 2024-03-22 ENCOUNTER — Telehealth: Payer: Self-pay | Admitting: Student in an Organized Health Care Education/Training Program

## 2024-03-22 NOTE — Telephone Encounter (Signed)
 Copied from CRM [id number]. Topic: Appointments - Scheduling Inquiry for Clinic >> Mar 22, 2024  2:20 PM Tyronne Galloway wrote: Reason for CRM: Pt would like to flip his visit from in person to virtual due to breaking his ankle. Please confirm the appt through MyChart message to the patient if possible. Pt's appt is on 5/13 at 315pm with Dr. Darnelle Elders.

## 2024-03-22 NOTE — Telephone Encounter (Signed)
 Patient will keep appt and has scheduled PFT. NFN.

## 2024-03-24 ENCOUNTER — Other Ambulatory Visit: Payer: Self-pay

## 2024-03-24 DIAGNOSIS — J4550 Severe persistent asthma, uncomplicated: Secondary | ICD-10-CM

## 2024-03-24 DIAGNOSIS — R0602 Shortness of breath: Secondary | ICD-10-CM

## 2024-03-27 ENCOUNTER — Encounter

## 2024-03-28 ENCOUNTER — Ambulatory Visit: Admitting: Student in an Organized Health Care Education/Training Program

## 2024-04-03 ENCOUNTER — Other Ambulatory Visit: Payer: Self-pay

## 2024-04-05 ENCOUNTER — Other Ambulatory Visit: Payer: Self-pay

## 2024-04-07 ENCOUNTER — Other Ambulatory Visit: Payer: Self-pay

## 2024-04-07 ENCOUNTER — Other Ambulatory Visit (HOSPITAL_COMMUNITY): Payer: Self-pay

## 2024-04-07 NOTE — Progress Notes (Signed)
 Specialty Pharmacy Refill Coordination Note  John Schwartz is a 28 y.o. male contacted today regarding refills of specialty medication(s) Dupilumab  (Dupixent )   Patient requested Delivery   Delivery date: 04/12/24   Verified address: [address], [place] [address]   Medication will be filled on 04/11/24.

## 2024-04-11 ENCOUNTER — Other Ambulatory Visit: Payer: Self-pay

## 2024-05-04 ENCOUNTER — Other Ambulatory Visit: Payer: Self-pay

## 2024-05-08 ENCOUNTER — Other Ambulatory Visit: Payer: Self-pay

## 2024-05-08 NOTE — Progress Notes (Signed)
 Specialty Pharmacy Refill Coordination Note  John Schwartz is a 28 y.o. male contacted today regarding refills of specialty medication(s) Dupilumab  (Dupixent )   Patient requested Delivery   Delivery date: 05/10/24   Verified address: [address]  [place] [address]   Medication will be filled on 05/09/24.

## 2024-05-09 ENCOUNTER — Other Ambulatory Visit: Payer: Self-pay

## 2024-05-30 ENCOUNTER — Other Ambulatory Visit (HOSPITAL_COMMUNITY): Payer: Self-pay

## 2024-05-30 ENCOUNTER — Other Ambulatory Visit: Payer: Self-pay

## 2024-05-30 ENCOUNTER — Other Ambulatory Visit: Payer: Self-pay | Admitting: Pharmacy Technician

## 2024-05-30 NOTE — Progress Notes (Signed)
 Specialty Pharmacy Refill Coordination Note  John Schwartz is a 28 y.o. male contacted today regarding refills of specialty medication(s) Dupilumab  (Dupixent )   Patient requested Delivery   Delivery date: 05/31/24   Verified address: [address] [place]   Medication will be filled on 05/30/24.

## 2024-06-20 ENCOUNTER — Other Ambulatory Visit: Payer: Self-pay

## 2024-06-22 ENCOUNTER — Other Ambulatory Visit: Payer: Self-pay

## 2024-06-22 ENCOUNTER — Other Ambulatory Visit: Payer: Self-pay | Admitting: Student in an Organized Health Care Education/Training Program

## 2024-06-22 ENCOUNTER — Other Ambulatory Visit (HOSPITAL_COMMUNITY): Payer: Self-pay

## 2024-06-22 DIAGNOSIS — J4550 Severe persistent asthma, uncomplicated: Secondary | ICD-10-CM

## 2024-06-22 MED ORDER — DUPIXENT 300 MG/2ML ~~LOC~~ SOAJ
300.0000 mg | SUBCUTANEOUS | 0 refills | Status: DC
  Filled 2024-06-22: qty 4, 28d supply, fill #0
  Filled 2024-08-02: qty 4, 28d supply, fill #1

## 2024-06-22 NOTE — Telephone Encounter (Signed)
 Refill sent for Dupixent  to [facility]: [phone]   Dose: 300mg  [place] every 14 days   Last OV: 12/14/2023 Provider: Dr. Belva November  Next OV: 08/21/24, [place] Rouleau  Aleck Puls, PharmD, BCPS Clinical Pharmacist (Pulmonology)

## 2024-06-22 NOTE — Progress Notes (Signed)
 Specialty Pharmacy Refill Coordination Note  John Schwartz is a 28 y.o. male contacted today regarding refills of specialty medication(s) Dupilumab  (Dupixent )   Patient requested Delivery   Delivery date: 06/30/24   Verified address: [address] [place]   Medication will be filled on 06/29/24, pending refill approval.

## 2024-06-29 ENCOUNTER — Other Ambulatory Visit (HOSPITAL_COMMUNITY): Payer: Self-pay

## 2024-06-29 ENCOUNTER — Telehealth: Payer: Self-pay

## 2024-06-29 ENCOUNTER — Other Ambulatory Visit: Payer: Self-pay

## 2024-06-29 NOTE — Telephone Encounter (Signed)
 Received notification from [facility] pharmacy that patient requires a new authorization for their medication. Pt has No Showed his last 3 scheduled appointments (a fourth appointment was canceled due to weather), which means that the most recent OV documentation available to submit with PA request is the OV prior to initiation of Dupixent . Likelihood of obtaining approval prior to having a f/u visit is exceptionally low. Will attempt to send to plan *without* chart notes for best results.  Submitted an URGENT Prior Authorization request to [facility] for DUPIXENT  via CoverMyMeds. Will update once we receive a response.  Key: John Schwartz

## 2024-06-29 NOTE — Telephone Encounter (Signed)
 Received notification from [facility] Medicaid regarding a prior authorization for DUPIXENT . Authorization has been APPROVED from 06/29/2024 to 06/29/2025. Approval letter sent to scan center.  Authorization # [id number]  Sherry Pennant, PharmD, MPH, BCPS, CPP Clinical Pharmacist (Rheumatology and Pulmonology)

## 2024-07-05 ENCOUNTER — Other Ambulatory Visit: Payer: Self-pay

## 2024-07-05 ENCOUNTER — Emergency Department
Admission: EM | Admit: 2024-07-05 | Discharge: 2024-07-05 | Disposition: A | Discharge status: Home / Self Care | Attending: Emergency Medicine | Admitting: Emergency Medicine

## 2024-07-05 DIAGNOSIS — S50861A Insect bite (nonvenomous) of right forearm, initial encounter: Secondary | ICD-10-CM | POA: Insufficient documentation

## 2024-07-05 DIAGNOSIS — W57XXXA Bitten or stung by nonvenomous insect and other nonvenomous arthropods, initial encounter: Secondary | ICD-10-CM | POA: Diagnosis not present

## 2024-07-05 MED ORDER — CEPHALEXIN 500 MG PO CAPS: 500.0000 mg | ORAL_CAPSULE | Freq: Three times a day (TID) | ORAL | 0 refills | Status: DC

## 2024-07-05 NOTE — ED Provider Notes (Signed)
 [facility] Provider Note    Event Date/Time   First MD Initiated Contact with Patient 07/05/24 [phone]     (approximate)   History   Insect Bite   HPI  John Schwartz is a 28 y.o. male who comes ED complaining of a red swollen area on his right forearm.  Felt like he had a spider bite yesterday and noticed that throughout the day the area became red and swollen and started to become painful.  Denies fever or chills.  No puncture wounds or other trauma.  Did not observe any specific spider.  No tick/snake/mammalian encounter.     Physical Exam   Triage Vital Signs: ED Triage Vitals  Encounter Vitals Group     BP 07/05/24 0015 (!) 144/100     Girls Systolic BP Percentile --      Girls Diastolic BP Percentile --      Boys Systolic BP Percentile --      Boys Diastolic BP Percentile --      Pulse Rate 07/05/24 0015 81     Resp 07/05/24 0015 17     Temp 07/05/24 0015 98.7 F (37.1 C)     Temp src --      SpO2 07/05/24 0015 98 %     Weight 07/05/24 0014 197 lb (89.4 kg)     Height 07/05/24 0014 5' 10 (1.778 m)     Head Circumference --      Peak Flow --      Pain Score 07/05/24 0014 7     Pain Loc --      Pain Education --      Exclude from Growth Chart --     Most recent vital signs: Vitals:   07/05/24 0015  BP: (!) 144/100  Pulse: 81  Resp: 17  Temp: 98.7 F (37.1 C)  SpO2: 98%    General: Awake, no distress.  CV:  Good peripheral perfusion.  Regular rate rhythm.  Normal distal pulses Resp:  Normal effort.  Abd:  No distention.  Other:  Right forearm with 3 cm area of induration, erythema warmth tenderness.  No fluctuance or purulent drainage.  There is a central skin ulceration 1 to 2 mm in size   ED Results / Procedures / Treatments   Labs (all labs ordered are listed, but only abnormal results are displayed) Labs Reviewed - No data to display   RADIOLOGY    PROCEDURES:  Procedures   MEDICATIONS ORDERED IN  ED: Medications - No data to display   IMPRESSION / MDM / ASSESSMENT AND PLAN / ED COURSE  I reviewed the triage vital signs and the nursing notes.                              Differential diagnosis includes, but is not limited to, spider bite, cellulitis, doubt abscess or necrotizing fasciitis  Counseled on wound care, will start Keflex      FINAL CLINICAL IMPRESSION(S) / ED DIAGNOSES   Final diagnoses:  Insect bite of right forearm, initial encounter     Rx / DC Orders   ED Discharge Orders          Ordered    cephALEXin  (KEFLEX ) 500 MG capsule  3 times daily        07/05/24 0115             Note:  This document was prepared using Dragon  voice recognition software and may include unintentional dictation errors.   Viviann Pastor, MD 07/05/24 [phone]

## 2024-07-05 NOTE — ED Triage Notes (Signed)
 Pt reports insect bite to right forearm, pt has swelling and pain

## 2024-07-20 ENCOUNTER — Other Ambulatory Visit: Payer: Self-pay

## 2024-07-27 ENCOUNTER — Other Ambulatory Visit (HOSPITAL_COMMUNITY): Payer: Self-pay

## 2024-07-31 ENCOUNTER — Other Ambulatory Visit (HOSPITAL_COMMUNITY): Payer: Self-pay

## 2024-08-02 ENCOUNTER — Other Ambulatory Visit: Payer: Self-pay

## 2024-08-02 NOTE — Progress Notes (Signed)
 Specialty Pharmacy Refill Coordination Note  John Schwartz is a 28 y.o. male contacted today regarding refills of specialty medication(s) Dupilumab  (Dupixent )   Patient requested Delivery   Delivery date: 08/15/24   Verified address: [address] [place]   Medication will be filled on 09.29.25.

## 2024-08-14 ENCOUNTER — Other Ambulatory Visit: Payer: Self-pay

## 2024-08-21 ENCOUNTER — Encounter: Payer: Self-pay | Admitting: Nurse Practitioner

## 2024-08-21 ENCOUNTER — Ambulatory Visit: Admitting: Nurse Practitioner

## 2024-08-21 ENCOUNTER — Encounter

## 2024-09-04 ENCOUNTER — Other Ambulatory Visit: Payer: Self-pay

## 2024-09-04 ENCOUNTER — Other Ambulatory Visit (HOSPITAL_COMMUNITY): Payer: Self-pay

## 2024-09-04 ENCOUNTER — Other Ambulatory Visit: Payer: Self-pay | Admitting: Student in an Organized Health Care Education/Training Program

## 2024-09-04 DIAGNOSIS — J4550 Severe persistent asthma, uncomplicated: Secondary | ICD-10-CM

## 2024-09-04 NOTE — Telephone Encounter (Signed)
 Received refill request for DUPIXENT  from [facility]: [phone]   Dose: 300mg  [place] every 14 days   Last OV: 12/14/23 Provider: Dr. Isadora   Patient has not been seen since starting Dupixent  on 12/30/23. Sequence of 5 no-show appointments in chart.  Pharmacy team unable to provide further fills of Dupixent  until patient is seen in office.   Alerting provider and messaging patient.   Aleck Puls, PharmD, BCPS Clinical Pharmacist  [facility]

## 2024-09-06 ENCOUNTER — Ambulatory Visit: Admitting: Student in an Organized Health Care Education/Training Program

## 2024-09-06 ENCOUNTER — Ambulatory Visit (INDEPENDENT_AMBULATORY_CARE_PROVIDER_SITE_OTHER): Admitting: Student in an Organized Health Care Education/Training Program

## 2024-09-06 ENCOUNTER — Other Ambulatory Visit: Payer: Self-pay

## 2024-09-06 ENCOUNTER — Encounter: Payer: Self-pay | Admitting: Student in an Organized Health Care Education/Training Program

## 2024-09-06 ENCOUNTER — Other Ambulatory Visit (HOSPITAL_COMMUNITY): Payer: Self-pay

## 2024-09-06 VITALS — BP 150/100 | HR 60 | Temp 98.1°F | Ht 70.0 in | Wt 195.8 lb

## 2024-09-06 DIAGNOSIS — J4550 Severe persistent asthma, uncomplicated: Secondary | ICD-10-CM

## 2024-09-06 MED ORDER — DUPIXENT 300 MG/2ML ~~LOC~~ SOAJ: 300.0000 mg | SUBCUTANEOUS | 0 refills | Status: DC

## 2024-09-06 MED ORDER — FLUTICASONE-SALMETEROL 250-50 MCG/ACT IN AEPB: 1.0000 | INHALATION_SPRAY | Freq: Two times a day (BID) | RESPIRATORY_TRACT | 12 refills | Status: AC

## 2024-09-06 MED ORDER — DUPIXENT 300 MG/2ML ~~LOC~~ SOAJ
300.0000 mg | SUBCUTANEOUS | 2 refills | Status: DC
  Filled 2024-09-06 – 2024-09-11 (×3): qty 4, 28d supply, fill #0
  Filled 2024-09-27: qty 4, 28d supply, fill #1
  Filled 2024-10-24 – 2024-11-13 (×2): qty 4, 28d supply, fill #2

## 2024-09-06 NOTE — Telephone Encounter (Signed)
 Rx for Dupixent  triaged to [facility] - patient see by Dr. Isadora today.

## 2024-09-06 NOTE — Progress Notes (Signed)
 Assessment & Plan:   #Severe persistent asthma without complication (HCC) (Primary)  History of severe persistent asthma with previously recurrent exacerbations (some of which required intubation) presenting for follow up. R3eview of medical record was notable for t-helper cell type two response with previously elevated FENO in clinic (230 ppb) and significant eosinophilia. He was starte don Dupilumab  and continued on high dose ICS/LABA, with significant improvement in symptoms and their near resolution.  He is yet to have his PFT's and I've asked him to schedule them before his follow up. Given well controlled symptoms, we will continue Dupilumab  and step down his ICS/LABA to 250-50 advair dose. He has self tapered off of prednisone  after starting Dupilumab .   - fluticasone -salmeterol (ADVAIR DISKUS) 250-50 MCG/ACT AEPB; Inhale 1 puff into the lungs in the morning and at bedtime.  Dispense: 60 each; Refill: 12 - Pulmonary Function Test; Future - Continue Dupilumab    Return in about 6 months (around 03/07/2025).  I spent 35 minutes caring for this patient today, including preparing to see the patient, obtaining a medical history , reviewing a separately obtained history, performing a medically appropriate examination and/or evaluation, counseling and educating the patient/family/caregiver, ordering medications, tests, or procedures, documenting clinical information in the electronic health record, and independently interpreting results (not separately reported/billed) and communicating results to the patient/family/caregiver  Belva November, MD [facility]   End of visit medications:  Meds ordered this encounter  Medications   fluticasone -salmeterol (ADVAIR DISKUS) 250-50 MCG/ACT AEPB    Sig: Inhale 1 puff into the lungs in the morning and at bedtime.    Dispense:  60 each    Refill:  12     Current Outpatient Medications:    acetaminophen  (TYLENOL ) 325 MG  tablet, Take 2 tablets (650 mg total) by mouth every 6 (six) hours as needed for mild pain (or Fever >/= 101)., Disp: 20 tablet, Rfl: 0   albuterol  (VENTOLIN  HFA) 108 (90 Base) MCG/ACT inhaler, Inhale 2 puffs into the lungs every 4 (four) hours as needed for wheezing or shortness of breath., Disp: 8 g, Rfl: 2   amLODipine (NORVASC) 5 MG tablet, Take 5 mg by mouth daily., Disp: , Rfl:    cephALEXin  (KEFLEX ) 500 MG capsule, Take 1 capsule (500 mg total) by mouth 3 (three) times daily., Disp: 21 capsule, Rfl: 0   chlorpheniramine-HYDROcodone  (TUSSIONEX) 10-8 MG/5ML, Take 5 mLs by mouth every 12 (twelve) hours as needed for cough., Disp: 115 mL, Rfl: 0   Dupilumab  (DUPIXENT ) 300 MG/2ML SOAJ, Inject 300 mg into the skin every 14 (fourteen) days. **Patient must keep appointment for further refills.**, Disp: 8 mL, Rfl: 0   fluticasone -salmeterol (ADVAIR DISKUS) 250-50 MCG/ACT AEPB, Inhale 1 puff into the lungs in the morning and at bedtime., Disp: 60 each, Rfl: 12   HYDROcodone -acetaminophen  (NORCO/VICODIN) 5-325 MG tablet, Take 1 tablet by mouth every 4 (four) hours as needed., Disp: 20 tablet, Rfl: 0   ipratropium-albuterol  (DUONEB) 0.5-2.5 (3) MG/3ML SOLN, Take 3 mLs by nebulization every 6 (six) hours as needed (For shortness of breath and wheezing). Up to 4 times daily if needed for wheezing or shortness of breath., Disp: 90 mL, Rfl: 2   montelukast  (SINGULAIR ) 10 MG tablet, Take 1 tablet (10 mg total) by mouth daily., Disp: 30 tablet, Rfl: 12   predniSONE  (DELTASONE ) 20 MG tablet, Take 20 mg by mouth daily., Disp: , Rfl:    Spacer/Aero-Holding Chambers (AEROCHAMBER MV) inhaler, Use as instructed, Disp: 1 each, Rfl: 0  Subjective:   PATIENT ID: John Schwartz GENDER: male DOB: [birth year], MRN: [id number]  Chief Complaint  Patient presents with   Asthma    HPI  Pleasant 28 year old male with history of severe persistent asthma presenting for follow up.  Return Visit 12/14/2023:  He has a  longstanding history of severe persistent asthma diagnosed as a child.  He was previously followed by pediatric pulmonology and subsequently adult pulmonology at [facility].  He has had multiple exacerbations over the past 15 years and is currently maintained on chronic prednisone  20 mg daily in addition to high-dose Advair 500-50 (started October 2024). He denied any occupational or inhalational exposures.  He is unaware of any allergies.     He lives at home with his family, having lived in the same house for the past 4 years.  The house has hardwood floors and no carpet.  They do not have any pets. Patient is keenly aware of the severity of his asthma and highly understanding of tachyphylaxis with the use of albuterol .  He is concerned that with his asthma being uncontrolled but he would end up in the hospital again.  He is inquiring about the potential for biologic therapy for asthma.   Patient has an extensive history of asthma with innumerable presentations to providers, urgent care, ED, and hospitalizations for uncontrolled asthma. He was seen 5 times in 2024 in the hospital for uncontrolled asthma which included 3 admissions. He has a history of intubation and mechanical ventilation secondary to asthma exacerbations.  Review of the medical record notes persistently elevated eosinophil count with peak eosinophilia of 2300.  Nearly every CBC with differential he has had in the medical record is notable for the elevated eosinophilia.  Previous spirometry had shown a fixed obstructive defect but I do not have access to the flow-volume loops to review.  Allergen panel at [facility] in 2018 was overall negative with an IgE level of 49.9.  Aspergillus IgE in 2018 was similarly negative.   He was seen in our ED earlier this month and was admitted similarly for uncontrolled asthma.  He similarly had multiple presentations over the past few years and tells me that he has been maintained on prednisone  for many  many years.   Patient reports history of incarceration where he felt his asthma was better controlled, getting worse after being released. He denies any smoking history or any other inhalational drug use.  Return Visit 09/06/2024:  He has started his Dupilumab  and has felt significant improvement with it. He has been compliant with his shots as well as inhalers, and feels near resolution of asthma symptoms. He has not had any exacerbations, and denies wheezing. He is able to work out and be active. He denies any inhalational exposures. He is overall feeling much improved since starting Dupilumab . Prednisone  stopped after he started Dupilumab .  Ancillary information including prior medications, full medical/surgical/family/social histories, and PFTs (when available) are listed below and have been reviewed.    Review of Systems  Constitutional:  Negative for chills, fever and weight loss.  Respiratory:  Negative for cough, hemoptysis, sputum production, shortness of breath and wheezing.   Cardiovascular:  Negative for chest pain.     Objective:   Vitals:   09/06/24 1431  BP: (!) 150/100  Pulse: 60  Temp: 98.1 F (36.7 C)  TempSrc: Temporal  SpO2: 98%  Weight: 195 lb 12.8 oz (88.8 kg)  Height: 5' 10 (1.778 m)   98% on RA  BMI Readings from Last 3 Encounters:  09/06/24 28.09 kg/m  07/05/24 28.27 kg/m  12/27/23 29.13 kg/m   Wt Readings from Last 3 Encounters:  09/06/24 195 lb 12.8 oz (88.8 kg)  07/05/24 197 lb (89.4 kg)  12/15/23 203 lb (92.1 kg)    Physical Exam Constitutional:      Appearance: Normal appearance. He is not ill-appearing.  HENT:     Head: Normocephalic and atraumatic.  Cardiovascular:     Rate and Rhythm: Normal rate and regular rhythm.     Pulses: Normal pulses.     Heart sounds: Normal heart sounds.  Pulmonary:     Effort: Pulmonary effort is normal.     Breath sounds: Normal breath sounds.  Abdominal:     Tenderness: There is no abdominal  tenderness.  Musculoskeletal:     Right lower leg: No edema.     Left lower leg: No edema.  Neurological:     General: No focal deficit present.     Mental Status: He is alert and oriented to person, place, and time. Mental status is at baseline.       Ancillary Information    Past Medical History:  Diagnosis Date   Asthma    Bronchospasm      Family History  Problem Relation Age of Onset   Arthritis Paternal Grandmother      Past Surgical History:  Procedure Laterality Date   NO PAST SURGERIES      Social History   Socioeconomic History   Marital status: Single    Spouse name: Not on file   Number of children: Not on file   Years of education: Not on file   Highest education level: Not on file  Occupational History   Not on file  Tobacco Use   Smoking status: Never   Smokeless tobacco: Never  Vaping Use   Vaping status: Never Used  Substance and Sexual Activity   Alcohol use: No   Drug use: Not Currently    Types: Marijuana    Comment: Hx of smoking marajauna, last used over a year ago   Sexual activity: Yes    Birth control/protection: Condom  Other Topics Concern   Not on file  Social History Narrative   ** Merged History Encounter **       Lives with Mom 2 brothers and 1 sister (67, 63, 71)   Social Drivers of Corporate investment banker Strain: Not on file  Food Insecurity: No Food Insecurity (12/27/2023)   Hunger Vital Sign    Worried About Running Out of Food in the Last Year: Never true    Ran Out of Food in the Last Year: Never true  Transportation Needs: No Transportation Needs (12/27/2023)   PRAPARE - Administrator, Civil Service (Medical): No    Lack of Transportation (Non-Medical): No  Physical Activity: Not on file  Stress: Not on file  Social Connections: Unknown (12/27/2023)   Social Connection and Isolation Panel    Frequency of Communication with Friends and Family: Twice a week    Frequency of Social Gatherings  with Friends and Family: Twice a week    Attends Religious Services: Patient declined    Database administrator or Organizations: No    Attends Banker Meetings: Never    Marital Status: Never married  Intimate Partner Violence: Not At Risk (12/27/2023)   Humiliation, Afraid, Rape, and Kick questionnaire    Fear of Current or Ex-Partner:  No    Emotionally Abused: No    Physically Abused: No    Sexually Abused: No     No Known Allergies   CBC    Component Value Date/Time   WBC 11.9 (H) 12/27/2023 0601   RBC 4.77 12/27/2023 0601   HGB 13.6 12/27/2023 0601   HCT 41.3 12/27/2023 0601   PLT 268 12/27/2023 0601   MCV 86.6 12/27/2023 0601   MCH 28.5 12/27/2023 0601   MCHC 32.9 12/27/2023 0601   RDW 12.2 12/27/2023 0601   LYMPHSABS 2.1 12/27/2023 0058   MONOABS 0.7 12/27/2023 0058   EOSABS 1.1 (H) 12/27/2023 0058   BASOSABS 0.0 12/27/2023 0058    Pulmonary Functions Testing Results:     No data to display          Outpatient Medications Prior to Visit  Medication Sig Dispense Refill   acetaminophen  (TYLENOL ) 325 MG tablet Take 2 tablets (650 mg total) by mouth every 6 (six) hours as needed for mild pain (or Fever >/= 101). 20 tablet 0   albuterol  (VENTOLIN  HFA) 108 (90 Base) MCG/ACT inhaler Inhale 2 puffs into the lungs every 4 (four) hours as needed for wheezing or shortness of breath. 8 g 2   amLODipine (NORVASC) 5 MG tablet Take 5 mg by mouth daily.     cephALEXin  (KEFLEX ) 500 MG capsule Take 1 capsule (500 mg total) by mouth 3 (three) times daily. 21 capsule 0   chlorpheniramine-HYDROcodone  (TUSSIONEX) 10-8 MG/5ML Take 5 mLs by mouth every 12 (twelve) hours as needed for cough. 115 mL 0   Dupilumab  (DUPIXENT ) 300 MG/2ML SOAJ Inject 300 mg into the skin every 14 (fourteen) days. **Patient must keep appointment for further refills.** 8 mL 0   HYDROcodone -acetaminophen  (NORCO/VICODIN) 5-325 MG tablet Take 1 tablet by mouth every 4 (four) hours as needed. 20  tablet 0   ipratropium-albuterol  (DUONEB) 0.5-2.5 (3) MG/3ML SOLN Take 3 mLs by nebulization every 6 (six) hours as needed (For shortness of breath and wheezing). Up to 4 times daily if needed for wheezing or shortness of breath. 90 mL 2   montelukast  (SINGULAIR ) 10 MG tablet Take 1 tablet (10 mg total) by mouth daily. 30 tablet 12   predniSONE  (DELTASONE ) 20 MG tablet Take 20 mg by mouth daily.     Spacer/Aero-Holding Chambers (AEROCHAMBER MV) inhaler Use as instructed 1 each 0   fluticasone -salmeterol (ADVAIR HFA) 230-21 MCG/ACT inhaler Inhale 2 puffs into the lungs 2 (two) times daily. 120 each 12   No facility-administered medications prior to visit.

## 2024-09-06 NOTE — Telephone Encounter (Signed)
 Patient was seen by Dr. Isadora today.

## 2024-09-07 ENCOUNTER — Other Ambulatory Visit (HOSPITAL_COMMUNITY): Payer: Self-pay

## 2024-09-11 ENCOUNTER — Other Ambulatory Visit: Payer: Self-pay

## 2024-09-11 NOTE — Progress Notes (Signed)
 Specialty Pharmacy Refill Coordination Note  John Schwartz is a 28 y.o. male contacted today regarding refills of specialty medication(s) Dupilumab  (Dupixent )   Patient requested Delivery   Delivery date: 09/13/24   Verified address: [address] [place]   Medication will be filled on: 09/12/24

## 2024-09-12 ENCOUNTER — Other Ambulatory Visit (HOSPITAL_COMMUNITY): Payer: Self-pay

## 2024-09-13 ENCOUNTER — Other Ambulatory Visit (HOSPITAL_COMMUNITY): Payer: Self-pay

## 2024-09-15 ENCOUNTER — Other Ambulatory Visit: Payer: Self-pay

## 2024-09-15 NOTE — Progress Notes (Signed)
 Medication was not rang out on 10.28.25. Confirmed medication was delivered, and rang out dupixent  today on AR.

## 2024-09-27 ENCOUNTER — Ambulatory Visit
Admission: EM | Admit: 2024-09-27 | Discharge: 2024-09-27 | Disposition: A | Discharge status: Home / Self Care | Attending: Family Medicine | Admitting: Family Medicine

## 2024-09-27 ENCOUNTER — Encounter

## 2024-09-27 ENCOUNTER — Other Ambulatory Visit: Payer: Self-pay

## 2024-09-27 DIAGNOSIS — J069 Acute upper respiratory infection, unspecified: Secondary | ICD-10-CM

## 2024-09-27 DIAGNOSIS — J029 Acute pharyngitis, unspecified: Secondary | ICD-10-CM

## 2024-09-27 LAB — POCT RAPID STREP A (OFFICE): Rapid Strep A Screen: NEGATIVE

## 2024-09-27 LAB — POC COVID19/FLU A&B COMBO
Covid Antigen, POC: NEGATIVE
Influenza A Antigen, POC: NEGATIVE
Influenza B Antigen, POC: NEGATIVE

## 2024-09-27 LAB — POCT MONO SCREEN (KUC): Mono, POC: NEGATIVE

## 2024-09-27 MED ORDER — PROMETHAZINE-DM 6.25-15 MG/5ML PO SYRP: 5.0000 mL | ORAL_SOLUTION | Freq: Four times a day (QID) | ORAL | 0 refills | Status: AC | PRN

## 2024-09-27 NOTE — ED Triage Notes (Signed)
 Patient states sx 2 days  Fatigue Chest congestion Sore throat

## 2024-09-27 NOTE — ED Provider Notes (Signed)
 [facility]    CSN: [id number] Arrival date & time: 09/27/24  1544      History   Chief Complaint Chief Complaint  Patient presents with   Sore Throat    HPI John Schwartz is a 28 y.o. male.   HPI  History obtained from the patient. John Schwartz presents for dry scratchy throat, fatigue, decreased energy level and increased number of soft stools.  Symptoms started 2 days ago. Endorses slight cough and rhinorrhea. Denies fever, headache, vomiting and belly pain.  Took ibuprofen  and Mucinex  with relief throat pain.  Mom has been sick.   Has history of asthma and has needed to use his albuterol  inhaler twice. No smoking or vaping.    Past Medical History:  Diagnosis Date   Asthma    Bronchospasm     Patient Active Problem List   Diagnosis Date Noted   Severe persistent asthma with exacerbation (HCC) 12/27/2023   Acute respiratory failure with hypoxia (HCC) 12/15/2023   Asthma 09/16/2023   Asthma with severe exacerbation 09/11/2023   Contusion of left elbow 12/16/2021   Acute severe exacerbation of asthma 05/14/2020   Asthma with status asthmaticus 06/04/2016   Acute hypokalemia 06/04/2016   Noncompliance with medication treatment due to underuse of medication 06/04/2016   Acute renal insufficiency 06/04/2016   Acute asthma exacerbation 06/04/2016   Dyspnea 03/16/2016   Acute exacerbation of severe persistent extrinsic asthma (HCC) 03/16/2016   Severe persistent asthma (HCC) 03/12/2014   Status asthmaticus 02/11/2014    Past Surgical History:  Procedure Laterality Date   NO PAST SURGERIES         Home Medications    Prior to Admission medications   Medication Sig Start Date End Date Taking? Authorizing Provider  promethazine -dextromethorphan  (PROMETHAZINE -DM) 6.25-15 MG/5ML syrup Take 5 mLs by mouth 4 (four) times daily as needed for cough. 09/27/24  Yes Rosaleen Mazer, DO  acetaminophen  (TYLENOL ) 325 MG tablet Take 2 tablets (650 mg total) by  mouth every 6 (six) hours as needed for mild pain (or Fever >/= 101). 05/27/19   Vachhani, Vaibhavkumar, MD  albuterol  (VENTOLIN  HFA) 108 (90 Base) MCG/ACT inhaler Inhale 2 puffs into the lungs every 4 (four) hours as needed for wheezing or shortness of breath. 12/31/23   Isadora Hose, MD  amLODipine (NORVASC) 5 MG tablet Take 5 mg by mouth daily. 09/03/24   [provider]  Dupilumab  (DUPIXENT ) 300 MG/2ML SOAJ Inject 300 mg into the skin every 14 (fourteen) days. **Patient must keep appointment for further refills.** 09/06/24   Dgayli, Hose, MD  fluticasone -salmeterol (ADVAIR DISKUS) 250-50 MCG/ACT AEPB Inhale 1 puff into the lungs in the morning and at bedtime. 09/06/24   Isadora Hose, MD  ipratropium-albuterol  (DUONEB) 0.5-2.5 (3) MG/3ML SOLN Take 3 mLs by nebulization every 6 (six) hours as needed (For shortness of breath and wheezing). Up to 4 times daily if needed for wheezing or shortness of breath. 11/23/23   Caleen Qualia, MD  montelukast  (SINGULAIR ) 10 MG tablet Take 1 tablet (10 mg total) by mouth daily. 12/14/23 12/13/24  Isadora Hose, MD  Spacer/Aero-Holding Chambers (AEROCHAMBER MV) inhaler Use as instructed 12/14/23   Isadora Hose, MD    Family History Family History  Problem Relation Age of Onset   Arthritis Paternal Grandmother     Social History Social History   Tobacco Use   Smoking status: Never   Smokeless tobacco: Never  Vaping Use   Vaping status: Never Used  Substance Use Topics  Alcohol use: No   Drug use: Not Currently    Types: Marijuana    Comment: Hx of smoking marajauna, last used over a year ago     Allergies   Patient has no known allergies.   Review of Systems Review of Systems: negative unless otherwise stated in HPI.      Physical Exam Triage Vital Signs ED Triage Vitals  Encounter Vitals Group     BP 09/27/24 1608 (!) 139/94     Girls Systolic BP Percentile --      Girls Diastolic BP Percentile --      Boys Systolic BP  Percentile --      Boys Diastolic BP Percentile --      Pulse Rate 09/27/24 1608 70     Resp 09/27/24 1608 18     Temp 09/27/24 1608 99 F (37.2 C)     Temp Source 09/27/24 1608 Oral     SpO2 09/27/24 1608 96 %     Weight 09/27/24 1607 196 lb (88.9 kg)     Height --      Head Circumference --      Peak Flow --      Pain Score 09/27/24 1607 0     Pain Loc --      Pain Education --      Exclude from Growth Chart --    No data found.  Updated Vital Signs BP (!) 139/94 (BP Location: Right Arm)   Pulse 70   Temp 99 F (37.2 C) (Oral)   Resp 18   Wt 88.9 kg   SpO2 96%   BMI 28.12 kg/m   Visual Acuity Right Eye Distance:   Left Eye Distance:   Bilateral Distance:    Right Eye Near:   Left Eye Near:    Bilateral Near:     Physical Exam GEN:     alert, non-toxic appearing male in no distress    HENT:  mucus membranes moist, oropharyngeal without lesions, mild erythema, + tonsillar hypertrophy or exudates,  clear nasal discharge, bilateral TM normal EYES:   pupils equal and reactive, no scleral injection or discharge NECK:  normal ROM, =lymphadenopathy, no meningismus   RESP:  no increased work of breathing, clear to auscultation bilaterally CVS:   regular rate and rhythm Skin:   warm and dry, no rash on visible skin    UC Treatments / Results  Labs (all labs ordered are listed, but only abnormal results are displayed) Labs Reviewed  POC COVID19/FLU A&B COMBO - Normal  POCT RAPID STREP A (OFFICE) - Normal  POCT MONO SCREEN ([facility]) - Normal    EKG   Radiology No results found.   Procedures Procedures (including critical care time)  Medications Ordered in UC Medications - No data to display  Initial Impression / Assessment and Plan / UC Course  I have reviewed the triage vital signs and the nursing notes.  Pertinent labs & imaging results that were available during my care of the patient were reviewed by me and considered in my medical decision making (see  chart for details).       Pt is a 28 y.o. male who presents for 2 days of respiratory symptoms. John Schwartz is afebrile here without recent antipyretics. Satting well on room air. Overall pt is non-toxic appearing, well hydrated, without respiratory distress. Pulmonary exam is unremarkable.  POC COVID and influenza panel obtained and was negative. POC strep is negative.  POC mononucleosis obtained and was negative.  Suspect viral respiratory illness. Discussed symptomatic treatment.  Promethazine  DM for cough.  Explained lack of efficacy of antibiotics in viral disease.  Typical duration of symptoms discussed.   Return and ED precautions given and voiced understanding. Discussed MDM, treatment plan and plan for follow-up with patient who agrees with plan.     Final Clinical Impressions(s) / UC Diagnoses   Final diagnoses:  Sore throat     Discharge Instructions      Your strep, influenza and COVID are negative. We will contact you if your mononucleosis test is positive. If your test is negative, you most likely have a viral respiratory infection that will gradually improve over the next 7-10 days. Cough may last up to 3 weeks. If your were prescribed medication, stop by the pharmacy to pick them up.   You can take Tylenol  and/or Ibuprofen  as needed for fever reduction and pain relief.    For cough: honey 1/2 to 1 teaspoon (you can dilute the honey in water or another fluid). Stop at the pharmacy to pick up your prescription cough medication. You can use a humidifier for chest congestion and cough.  If you don't have a humidifier, you can sit in the bathroom with the hot shower running.      For sore throat: try warm salt water gargles, Mucinex  sore throat cough drops or cepacol lozenges, throat spray, warm tea or water with lemon/honey, popsicles or ice, or OTC cold relief medicine for throat discomfort. You can also purchase chloraseptic spray at the pharmacy or dollar store.    For  congestion: take a daily anti-histamine like Zyrtec, Claritin, and a oral decongestant, such as pseudoephedrine.  You can also use Flonase  1-2 sprays in each nostril daily. Afrin is also a good option, if you do not have high blood pressure.    It is important to stay hydrated: drink plenty of fluids (water, gatorade/powerade/pedialyte, juices, or teas) to keep your throat moisturized and help further relieve irritation/discomfort.    Return or go to the Emergency Department if symptoms worsen or do not improve in the next few days      ED Prescriptions     Medication Sig Dispense Auth. Provider   promethazine -dextromethorphan  (PROMETHAZINE -DM) 6.25-15 MG/5ML syrup Take 5 mLs by mouth 4 (four) times daily as needed for cough. 118 mL Felisa Zechman, DO      PDMP not reviewed this encounter.   Elysabeth Aust, DO 10/07/24 [phone]

## 2024-09-27 NOTE — Progress Notes (Signed)
 Specialty Pharmacy Refill Coordination Note  John Schwartz is a 28 y.o. male contacted today regarding refills of specialty medication(s) Dupilumab  (Dupixent )   Patient requested Delivery   Delivery date: 10/05/24   Verified address: [address] [place]   Medication will be filled on: 10/04/24

## 2024-09-27 NOTE — Progress Notes (Signed)
 Specialty Pharmacy Ongoing Clinical Assessment Note  John Schwartz is a 28 y.o. male who is being followed by the specialty pharmacy service for RxSp Asthma/COPD   Patient's specialty medication(s) reviewed today: Dupilumab  (Dupixent )   Missed doses in the last 4 weeks: 0   Patient/Caregiver did not have any additional questions or concerns.   Therapeutic benefit summary: Patient is achieving benefit   Adverse events/side effects summary: No adverse events/side effects   Patient's therapy is appropriate to: Continue    Goals Addressed             This Visit's Progress    Maintain optimal adherence to therapy   On track    Patient is on track. Patient will maintain adherence, adhere to provider and/or lab appointments, and be evaluated at upcoming provider appointment to assess progress         Follow up: 12 months  [facility] Specialty Pharmacist

## 2024-09-27 NOTE — Discharge Instructions (Addendum)
 Your strep, influenza and COVID are negative. We will contact you if your mononucleosis test is positive. If your test is negative, you most likely have a viral respiratory infection that will gradually improve over the next 7-10 days. Cough may last up to 3 weeks. If your were prescribed medication, stop by the pharmacy to pick them up.   You can take Tylenol  and/or Ibuprofen  as needed for fever reduction and pain relief.    For cough: honey 1/2 to 1 teaspoon (you can dilute the honey in water or another fluid). Stop at the pharmacy to pick up your prescription cough medication. You can use a humidifier for chest congestion and cough.  If you don't have a humidifier, you can sit in the bathroom with the hot shower running.      For sore throat: try warm salt water gargles, Mucinex  sore throat cough drops or cepacol lozenges, throat spray, warm tea or water with lemon/honey, popsicles or ice, or OTC cold relief medicine for throat discomfort. You can also purchase chloraseptic spray at the pharmacy or dollar store.    For congestion: take a daily anti-histamine like Zyrtec, Claritin, and a oral decongestant, such as pseudoephedrine.  You can also use Flonase  1-2 sprays in each nostril daily. Afrin is also a good option, if you do not have high blood pressure.    It is important to stay hydrated: drink plenty of fluids (water, gatorade/powerade/pedialyte, juices, or teas) to keep your throat moisturized and help further relieve irritation/discomfort.    Return or go to the Emergency Department if symptoms worsen or do not improve in the next few days

## 2024-10-03 ENCOUNTER — Ambulatory Visit (INDEPENDENT_AMBULATORY_CARE_PROVIDER_SITE_OTHER)

## 2024-10-03 DIAGNOSIS — J4550 Severe persistent asthma, uncomplicated: Secondary | ICD-10-CM

## 2024-10-03 LAB — PULMONARY FUNCTION TEST
DL/VA % pred: 109 %
DL/VA: 5.54 ml/min/mmHg/L
DLCO unc % pred: 90 %
DLCO unc: 30.29 ml/min/mmHg
FEF 25-75 Post: 2.85 L/s
FEF 25-75 Pre: 1.58 L/s
FEF2575-%Change-Post: 79 %
FEF2575-%Pred-Post: 62 %
FEF2575-%Pred-Pre: 34 %
FEV1-%Change-Post: 24 %
FEV1-%Pred-Post: 68 %
FEV1-%Pred-Pre: 55 %
FEV1-Post: 3.1 L
FEV1-Pre: 2.5 L
FEV1FVC-%Change-Post: 15 %
FEV1FVC-%Pred-Pre: 79 %
FEV6-%Change-Post: 7 %
FEV6-%Pred-Post: 75 %
FEV6-%Pred-Pre: 69 %
FEV6-Post: 4.09 L
FEV6-Pre: 3.81 L
FEV6FVC-%Pred-Post: 101 %
FEV6FVC-%Pred-Pre: 101 %
FVC-%Change-Post: 7 %
FVC-%Pred-Post: 74 %
FVC-%Pred-Pre: 69 %
FVC-Post: 4.09 L
FVC-Pre: 3.81 L
Post FEV1/FVC ratio: 76 %
Post FEV6/FVC ratio: 100 %
Pre FEV1/FVC ratio: 65 %
Pre FEV6/FVC Ratio: 100 %
RV % pred: 103 %
RV: 1.64 L
TLC % pred: 84 %
TLC: 5.81 L

## 2024-10-03 NOTE — Patient Instructions (Signed)
 Full PFT completed today ? ?

## 2024-10-03 NOTE — Progress Notes (Signed)
 Full PFT completed today ? ?

## 2024-10-04 ENCOUNTER — Other Ambulatory Visit: Payer: Self-pay

## 2024-10-05 ENCOUNTER — Ambulatory Visit: Payer: Self-pay | Admitting: Student in an Organized Health Care Education/Training Program

## 2024-10-24 ENCOUNTER — Other Ambulatory Visit: Payer: Self-pay

## 2024-10-24 ENCOUNTER — Other Ambulatory Visit (HOSPITAL_COMMUNITY): Payer: Self-pay

## 2024-11-13 ENCOUNTER — Other Ambulatory Visit (HOSPITAL_COMMUNITY): Payer: Self-pay

## 2024-11-13 ENCOUNTER — Other Ambulatory Visit: Payer: Self-pay

## 2024-11-13 NOTE — Progress Notes (Signed)
 Specialty Pharmacy Refill Coordination Note  Spoke with John Schwartz  John Schwartz is a 28 y.o. male contacted today regarding refills of specialty medication(s) Dupilumab  (Dupixent )  Doses on hand: 0  Next inj: 11/14/24 (or when received)   Patient requested: Delivery   Delivery date: 11/14/24   Verified address: [address]. [place] [address]  Medication will be filled on 11/13/24

## 2024-12-04 ENCOUNTER — Other Ambulatory Visit: Payer: Self-pay | Admitting: Student in an Organized Health Care Education/Training Program

## 2024-12-04 ENCOUNTER — Other Ambulatory Visit: Payer: Self-pay

## 2024-12-04 DIAGNOSIS — J4550 Severe persistent asthma, uncomplicated: Secondary | ICD-10-CM

## 2024-12-05 ENCOUNTER — Other Ambulatory Visit: Payer: Self-pay

## 2024-12-05 ENCOUNTER — Other Ambulatory Visit (HOSPITAL_COMMUNITY): Payer: Self-pay

## 2024-12-06 ENCOUNTER — Other Ambulatory Visit: Payer: Self-pay

## 2024-12-06 MED ORDER — DUPIXENT 300 MG/2ML ~~LOC~~ SOAJ
300.0000 mg | SUBCUTANEOUS | 4 refills | Status: AC
  Filled 2024-12-06 – 2024-12-12 (×3): qty 4, 28d supply, fill #0

## 2024-12-06 NOTE — Telephone Encounter (Signed)
 Refill sent for DUPIXENT  to [facility]: [phone]   Dose: 300mg  [place] every 14 days   Last OV: 09/06/24 Provider: Dr. Isadora  Next OV: due April 2026  Aleck Puls, PharmD, BCPS Clinical Pharmacist  [facility]

## 2024-12-08 ENCOUNTER — Other Ambulatory Visit: Payer: Self-pay

## 2024-12-12 ENCOUNTER — Other Ambulatory Visit: Payer: Self-pay

## 2024-12-12 NOTE — Progress Notes (Signed)
 Specialty Pharmacy Refill Coordination Note  John Schwartz is a 29 y.o. male contacted today regarding refills of specialty medication(s) Dupilumab  (Dupixent )   Patient requested Delivery   Delivery date: 12/15/24   Verified address: [address] [place] [address]   Medication will be filled on: 12/14/24

## 2024-12-14 ENCOUNTER — Other Ambulatory Visit: Payer: Self-pay

## 2024-12-14 NOTE — Addendum Note (Signed)
 Addended by: Coraline Talwar L on: 12/14/2024 04:39 PM   Modules accepted: Orders
